# Patient Record
Sex: Male | Born: 1935 | Race: White | Hispanic: No | Smoking: Former smoker
Health system: Southern US, Community
[De-identification: ages and names within clinical notes are randomized; demographics above are authoritative.]

## PROBLEM LIST (undated history)

## (undated) DIAGNOSIS — K259 Gastric ulcer, unspecified as acute or chronic, without hemorrhage or perforation: Secondary | ICD-10-CM

## (undated) DIAGNOSIS — Z8639 Personal history of other endocrine, nutritional and metabolic disease: Secondary | ICD-10-CM

## (undated) DIAGNOSIS — A498 Other bacterial infections of unspecified site: Secondary | ICD-10-CM

## (undated) DIAGNOSIS — N400 Enlarged prostate without lower urinary tract symptoms: Secondary | ICD-10-CM

## (undated) DIAGNOSIS — D126 Benign neoplasm of colon, unspecified: Secondary | ICD-10-CM

## (undated) DIAGNOSIS — M1990 Unspecified osteoarthritis, unspecified site: Secondary | ICD-10-CM

## (undated) DIAGNOSIS — R4182 Altered mental status, unspecified: Secondary | ICD-10-CM

## (undated) DIAGNOSIS — I10 Essential (primary) hypertension: Secondary | ICD-10-CM

## (undated) DIAGNOSIS — E43 Unspecified severe protein-calorie malnutrition: Secondary | ICD-10-CM

## (undated) DIAGNOSIS — K635 Polyp of colon: Secondary | ICD-10-CM

## (undated) DIAGNOSIS — F411 Generalized anxiety disorder: Secondary | ICD-10-CM

## (undated) DIAGNOSIS — R911 Solitary pulmonary nodule: Secondary | ICD-10-CM

## (undated) DIAGNOSIS — M6281 Muscle weakness (generalized): Secondary | ICD-10-CM

## (undated) DIAGNOSIS — K1370 Unspecified lesions of oral mucosa: Secondary | ICD-10-CM

## (undated) DIAGNOSIS — F29 Unspecified psychosis not due to a substance or known physiological condition: Secondary | ICD-10-CM

## (undated) DIAGNOSIS — F329 Major depressive disorder, single episode, unspecified: Secondary | ICD-10-CM

## (undated) DIAGNOSIS — G259 Extrapyramidal and movement disorder, unspecified: Secondary | ICD-10-CM

## (undated) DIAGNOSIS — F1021 Alcohol dependence, in remission: Secondary | ICD-10-CM

## (undated) DIAGNOSIS — H269 Unspecified cataract: Secondary | ICD-10-CM

## (undated) DIAGNOSIS — N179 Acute kidney failure, unspecified: Secondary | ICD-10-CM

## (undated) DIAGNOSIS — F0390 Unspecified dementia without behavioral disturbance: Secondary | ICD-10-CM

## (undated) DIAGNOSIS — G459 Transient cerebral ischemic attack, unspecified: Secondary | ICD-10-CM

## (undated) DIAGNOSIS — R279 Unspecified lack of coordination: Secondary | ICD-10-CM

## (undated) DIAGNOSIS — F32A Depression, unspecified: Secondary | ICD-10-CM

## (undated) DIAGNOSIS — Z8619 Personal history of other infectious and parasitic diseases: Secondary | ICD-10-CM

## (undated) DIAGNOSIS — Z87440 Personal history of urinary (tract) infections: Secondary | ICD-10-CM

## (undated) DIAGNOSIS — Z8719 Personal history of other diseases of the digestive system: Secondary | ICD-10-CM

## (undated) DIAGNOSIS — E785 Hyperlipidemia, unspecified: Secondary | ICD-10-CM

## (undated) DIAGNOSIS — G4700 Insomnia, unspecified: Secondary | ICD-10-CM

## (undated) DIAGNOSIS — E119 Type 2 diabetes mellitus without complications: Secondary | ICD-10-CM

## (undated) DIAGNOSIS — F1011 Alcohol abuse, in remission: Secondary | ICD-10-CM

## (undated) DIAGNOSIS — S065X9A Traumatic subdural hemorrhage with loss of consciousness of unspecified duration, initial encounter: Secondary | ICD-10-CM

## (undated) DIAGNOSIS — I2510 Atherosclerotic heart disease of native coronary artery without angina pectoris: Secondary | ICD-10-CM

## (undated) DIAGNOSIS — I480 Paroxysmal atrial fibrillation: Secondary | ICD-10-CM

## (undated) DIAGNOSIS — F39 Unspecified mood [affective] disorder: Secondary | ICD-10-CM

## (undated) HISTORY — DX: Benign neoplasm of colon, unspecified: D12.6

## (undated) HISTORY — DX: Unspecified cataract: H26.9

## (undated) HISTORY — DX: Traumatic subdural hemorrhage with loss of consciousness of unspecified duration, initial encounter: S06.5X9A

## (undated) HISTORY — DX: Essential (primary) hypertension: I10

## (undated) HISTORY — DX: Personal history of other endocrine, nutritional and metabolic disease: Z86.39

## (undated) HISTORY — DX: Polyp of colon: K63.5

## (undated) HISTORY — DX: Other bacterial infections of unspecified site: A49.8

## (undated) HISTORY — DX: Gastric ulcer, unspecified as acute or chronic, without hemorrhage or perforation: K25.9

## (undated) HISTORY — DX: Benign prostatic hyperplasia without lower urinary tract symptoms: N40.0

## (undated) HISTORY — DX: Atherosclerotic heart disease of native coronary artery without angina pectoris: I25.10

## (undated) HISTORY — DX: Unspecified severe protein-calorie malnutrition: E43

## (undated) HISTORY — DX: Personal history of other infectious and parasitic diseases: Z86.19

## (undated) HISTORY — DX: Unspecified osteoarthritis, unspecified site: M19.90

## (undated) HISTORY — DX: Personal history of urinary (tract) infections: Z87.440

## (undated) HISTORY — DX: Personal history of other diseases of the digestive system: Z87.19

## (undated) HISTORY — DX: Transient cerebral ischemic attack, unspecified: G45.9

## (undated) HISTORY — DX: Alcohol dependence, in remission: F10.21

## (undated) HISTORY — DX: Depression, unspecified: F32.A

## (undated) HISTORY — DX: Hyperlipidemia, unspecified: E78.5

## (undated) HISTORY — DX: Solitary pulmonary nodule: R91.1

## (undated) HISTORY — DX: Paroxysmal atrial fibrillation: I48.0

## (undated) HISTORY — DX: Acute kidney failure, unspecified: N17.9

## (undated) HISTORY — DX: Type 2 diabetes mellitus without complications: E11.9

## (undated) HISTORY — DX: Insomnia, unspecified: G47.00

## (undated) HISTORY — DX: Generalized anxiety disorder: F41.1

## (undated) HISTORY — DX: Major depressive disorder, single episode, unspecified: F32.9

## (undated) HISTORY — DX: Unspecified dementia, unspecified severity, without behavioral disturbance, psychotic disturbance, mood disturbance, and anxiety: F03.90

## (undated) HISTORY — DX: Unspecified lesions of oral mucosa: K13.70

## (undated) HISTORY — DX: Alcohol abuse, in remission: F10.11

---

## 2000-08-27 ENCOUNTER — Emergency Department (HOSPITAL_COMMUNITY): Admission: EM | Admit: 2000-08-27 | Discharge: 2000-08-27 | Payer: Self-pay | Admitting: Emergency Medicine

## 2000-08-27 ENCOUNTER — Encounter: Payer: Self-pay | Admitting: Emergency Medicine

## 2000-08-29 ENCOUNTER — Emergency Department (HOSPITAL_COMMUNITY): Admission: EM | Admit: 2000-08-29 | Discharge: 2000-08-30 | Payer: Self-pay | Admitting: Emergency Medicine

## 2000-09-12 ENCOUNTER — Emergency Department (HOSPITAL_COMMUNITY): Admission: EM | Admit: 2000-09-12 | Discharge: 2000-09-12 | Payer: Self-pay | Admitting: Emergency Medicine

## 2001-03-17 ENCOUNTER — Encounter: Admission: RE | Admit: 2001-03-17 | Discharge: 2001-03-17 | Payer: Self-pay | Admitting: Family Medicine

## 2001-03-31 ENCOUNTER — Encounter: Admission: RE | Admit: 2001-03-31 | Discharge: 2001-03-31 | Payer: Self-pay | Admitting: Family Medicine

## 2001-05-04 ENCOUNTER — Encounter: Admission: RE | Admit: 2001-05-04 | Discharge: 2001-05-04 | Payer: Self-pay | Admitting: Sports Medicine

## 2001-05-20 ENCOUNTER — Encounter: Admission: RE | Admit: 2001-05-20 | Discharge: 2001-05-20 | Payer: Self-pay | Admitting: *Deleted

## 2001-05-20 ENCOUNTER — Encounter: Payer: Self-pay | Admitting: *Deleted

## 2001-06-17 ENCOUNTER — Encounter: Admission: RE | Admit: 2001-06-17 | Discharge: 2001-06-17 | Payer: Self-pay | Admitting: Family Medicine

## 2001-07-29 ENCOUNTER — Encounter: Admission: RE | Admit: 2001-07-29 | Discharge: 2001-07-29 | Payer: Self-pay | Admitting: Family Medicine

## 2001-08-02 ENCOUNTER — Encounter: Payer: Self-pay | Admitting: *Deleted

## 2001-08-02 ENCOUNTER — Encounter: Admission: RE | Admit: 2001-08-02 | Discharge: 2001-08-02 | Payer: Self-pay | Admitting: *Deleted

## 2001-09-06 ENCOUNTER — Encounter: Admission: RE | Admit: 2001-09-06 | Discharge: 2001-09-06 | Payer: Self-pay | Admitting: Family Medicine

## 2001-09-06 HISTORY — PX: INJECTION KNEE: SHX2446

## 2001-10-04 ENCOUNTER — Encounter: Admission: RE | Admit: 2001-10-04 | Discharge: 2001-10-04 | Payer: Self-pay | Admitting: Family Medicine

## 2001-10-14 ENCOUNTER — Encounter: Admission: RE | Admit: 2001-10-14 | Discharge: 2001-10-14 | Payer: Self-pay | Admitting: Family Medicine

## 2002-01-13 ENCOUNTER — Encounter: Admission: RE | Admit: 2002-01-13 | Discharge: 2002-01-13 | Payer: Self-pay | Admitting: Family Medicine

## 2002-02-03 ENCOUNTER — Encounter: Admission: RE | Admit: 2002-02-03 | Discharge: 2002-02-03 | Payer: Self-pay | Admitting: Family Medicine

## 2002-02-28 ENCOUNTER — Encounter: Payer: Self-pay | Admitting: *Deleted

## 2002-02-28 ENCOUNTER — Encounter: Admission: RE | Admit: 2002-02-28 | Discharge: 2002-02-28 | Payer: Self-pay | Admitting: Family Medicine

## 2002-02-28 ENCOUNTER — Encounter: Admission: RE | Admit: 2002-02-28 | Discharge: 2002-02-28 | Payer: Self-pay | Admitting: *Deleted

## 2003-12-11 ENCOUNTER — Other Ambulatory Visit: Payer: Self-pay

## 2003-12-14 ENCOUNTER — Other Ambulatory Visit: Payer: Self-pay

## 2004-02-05 ENCOUNTER — Encounter: Admission: RE | Admit: 2004-02-05 | Discharge: 2004-02-05 | Payer: Self-pay | Admitting: Family Medicine

## 2004-10-27 DIAGNOSIS — I2510 Atherosclerotic heart disease of native coronary artery without angina pectoris: Secondary | ICD-10-CM

## 2004-10-27 HISTORY — PX: CORONARY ARTERY BYPASS GRAFT: SHX141

## 2004-10-27 HISTORY — DX: Atherosclerotic heart disease of native coronary artery without angina pectoris: I25.10

## 2005-04-18 ENCOUNTER — Inpatient Hospital Stay (HOSPITAL_COMMUNITY): Admission: EM | Admit: 2005-04-18 | Discharge: 2005-04-29 | Payer: Self-pay | Admitting: *Deleted

## 2005-04-19 ENCOUNTER — Ambulatory Visit: Payer: Self-pay | Admitting: Internal Medicine

## 2005-05-03 ENCOUNTER — Emergency Department (HOSPITAL_COMMUNITY): Admission: EM | Admit: 2005-05-03 | Discharge: 2005-05-03 | Payer: Self-pay | Admitting: Emergency Medicine

## 2005-06-01 ENCOUNTER — Inpatient Hospital Stay (HOSPITAL_COMMUNITY): Admission: EM | Admit: 2005-06-01 | Discharge: 2005-06-02 | Payer: Self-pay | Admitting: Emergency Medicine

## 2005-06-02 ENCOUNTER — Encounter: Payer: Self-pay | Admitting: Cardiology

## 2006-05-15 ENCOUNTER — Inpatient Hospital Stay (HOSPITAL_COMMUNITY): Admission: EM | Admit: 2006-05-15 | Discharge: 2006-05-15 | Payer: Self-pay | Admitting: Emergency Medicine

## 2006-05-15 ENCOUNTER — Ambulatory Visit: Payer: Self-pay | Admitting: *Deleted

## 2006-06-20 ENCOUNTER — Emergency Department (HOSPITAL_COMMUNITY): Admission: EM | Admit: 2006-06-20 | Discharge: 2006-06-20 | Payer: Self-pay | Admitting: Emergency Medicine

## 2006-07-28 ENCOUNTER — Ambulatory Visit: Payer: Self-pay | Admitting: Gastroenterology

## 2006-12-24 DIAGNOSIS — M545 Low back pain, unspecified: Secondary | ICD-10-CM | POA: Insufficient documentation

## 2006-12-24 DIAGNOSIS — I2510 Atherosclerotic heart disease of native coronary artery without angina pectoris: Secondary | ICD-10-CM | POA: Insufficient documentation

## 2006-12-24 DIAGNOSIS — D126 Benign neoplasm of colon, unspecified: Secondary | ICD-10-CM

## 2006-12-24 DIAGNOSIS — F411 Generalized anxiety disorder: Secondary | ICD-10-CM | POA: Insufficient documentation

## 2006-12-24 DIAGNOSIS — K259 Gastric ulcer, unspecified as acute or chronic, without hemorrhage or perforation: Secondary | ICD-10-CM | POA: Insufficient documentation

## 2006-12-24 DIAGNOSIS — M129 Arthropathy, unspecified: Secondary | ICD-10-CM | POA: Insufficient documentation

## 2006-12-24 DIAGNOSIS — G479 Sleep disorder, unspecified: Secondary | ICD-10-CM | POA: Insufficient documentation

## 2006-12-24 DIAGNOSIS — F1021 Alcohol dependence, in remission: Secondary | ICD-10-CM

## 2006-12-24 DIAGNOSIS — G459 Transient cerebral ischemic attack, unspecified: Secondary | ICD-10-CM | POA: Insufficient documentation

## 2006-12-24 HISTORY — DX: Benign neoplasm of colon, unspecified: D12.6

## 2006-12-24 HISTORY — DX: Alcohol dependence, in remission: F10.21

## 2007-08-13 ENCOUNTER — Emergency Department: Payer: Medicare Other | Admitting: Emergency Medicine

## 2007-09-29 ENCOUNTER — Emergency Department: Payer: Medicare Other | Admitting: Emergency Medicine

## 2007-09-29 ENCOUNTER — Other Ambulatory Visit: Payer: Self-pay

## 2007-10-21 ENCOUNTER — Ambulatory Visit: Payer: Self-pay | Admitting: Internal Medicine

## 2007-10-21 ENCOUNTER — Inpatient Hospital Stay (HOSPITAL_COMMUNITY): Admission: EM | Admit: 2007-10-21 | Discharge: 2007-10-23 | Payer: Self-pay | Admitting: Emergency Medicine

## 2007-10-24 ENCOUNTER — Emergency Department: Payer: Medicare Other | Admitting: Emergency Medicine

## 2008-01-10 ENCOUNTER — Ambulatory Visit: Payer: Medicare Other

## 2008-05-27 HISTORY — PX: CARDIOVASCULAR STRESS TEST: SHX262

## 2008-07-14 ENCOUNTER — Emergency Department: Payer: Medicare Other | Admitting: Emergency Medicine

## 2008-09-09 ENCOUNTER — Emergency Department: Payer: Medicare Other | Admitting: Emergency Medicine

## 2009-01-07 ENCOUNTER — Emergency Department: Payer: Medicare Other

## 2009-06-08 ENCOUNTER — Emergency Department (HOSPITAL_COMMUNITY): Admission: EM | Admit: 2009-06-08 | Discharge: 2009-06-08 | Payer: Self-pay | Admitting: Emergency Medicine

## 2009-08-22 ENCOUNTER — Emergency Department (HOSPITAL_COMMUNITY): Admission: EM | Admit: 2009-08-22 | Discharge: 2009-08-22 | Payer: Self-pay | Admitting: Emergency Medicine

## 2009-09-02 ENCOUNTER — Emergency Department: Payer: Medicare Other | Admitting: Emergency Medicine

## 2009-10-10 ENCOUNTER — Emergency Department: Payer: Medicare Other | Admitting: Unknown Physician Specialty

## 2009-10-19 ENCOUNTER — Emergency Department (HOSPITAL_COMMUNITY): Admission: EM | Admit: 2009-10-19 | Discharge: 2009-10-19 | Payer: Self-pay | Admitting: Emergency Medicine

## 2009-10-29 LAB — TSH: TSH: 5.51

## 2009-11-12 ENCOUNTER — Inpatient Hospital Stay (HOSPITAL_COMMUNITY): Admission: EM | Admit: 2009-11-12 | Discharge: 2009-11-15 | Payer: Self-pay | Admitting: Emergency Medicine

## 2009-12-08 ENCOUNTER — Ambulatory Visit: Payer: Medicare Other | Admitting: Family Medicine

## 2010-05-27 DIAGNOSIS — R911 Solitary pulmonary nodule: Secondary | ICD-10-CM

## 2010-05-27 HISTORY — DX: Solitary pulmonary nodule: R91.1

## 2010-05-30 ENCOUNTER — Emergency Department (HOSPITAL_COMMUNITY): Admission: EM | Admit: 2010-05-30 | Discharge: 2010-05-30 | Payer: Self-pay | Admitting: Emergency Medicine

## 2010-05-30 LAB — COMPREHENSIVE METABOLIC PANEL
ALT: 85 U/L — AB (ref 10–40)
AST: 135 U/L
Alkaline Phosphatase: 113 U/L
Creat: 1.1
Sodium: 141 mmol/L (ref 137–147)
Total Bilirubin: 1.9 mg/dL

## 2010-05-30 LAB — CBC AND DIFFERENTIAL
HCT: 46 %
Hemoglobin: 16.1 g/dL (ref 13.5–17.5)
WBC: 10.1

## 2010-08-05 ENCOUNTER — Emergency Department: Payer: Medicare Other | Admitting: Emergency Medicine

## 2010-09-25 ENCOUNTER — Inpatient Hospital Stay: Payer: Medicare Other | Admitting: *Deleted

## 2010-10-14 ENCOUNTER — Emergency Department: Payer: Medicare Other | Admitting: Emergency Medicine

## 2010-10-15 ENCOUNTER — Inpatient Hospital Stay (HOSPITAL_COMMUNITY)
Admission: EM | Admit: 2010-10-15 | Discharge: 2010-10-19 | Payer: Self-pay | Source: Home / Self Care | Attending: Internal Medicine | Admitting: Internal Medicine

## 2010-10-27 DIAGNOSIS — A498 Other bacterial infections of unspecified site: Secondary | ICD-10-CM

## 2010-10-27 HISTORY — DX: Other bacterial infections of unspecified site: A49.8

## 2010-11-03 ENCOUNTER — Inpatient Hospital Stay (HOSPITAL_COMMUNITY)
Admission: EM | Admit: 2010-11-03 | Discharge: 2010-11-08 | Payer: Self-pay | Source: Home / Self Care | Attending: Internal Medicine | Admitting: Internal Medicine

## 2010-11-11 LAB — URINALYSIS, ROUTINE W REFLEX MICROSCOPIC
Bilirubin Urine: NEGATIVE
Hgb urine dipstick: NEGATIVE
Ketones, ur: NEGATIVE mg/dL
Nitrite: NEGATIVE
Protein, ur: NEGATIVE mg/dL
Specific Gravity, Urine: 1.016 (ref 1.005–1.030)
Urine Glucose, Fasting: NEGATIVE mg/dL
Urobilinogen, UA: 0.2 mg/dL (ref 0.0–1.0)
pH: 5.5 (ref 5.0–8.0)

## 2010-11-11 LAB — DIFFERENTIAL
Basophils Absolute: 0 10*3/uL (ref 0.0–0.1)
Basophils Absolute: 0 10*3/uL (ref 0.0–0.1)
Basophils Relative: 0 % (ref 0–1)
Basophils Relative: 0 % (ref 0–1)
Eosinophils Absolute: 0 10*3/uL (ref 0.0–0.7)
Eosinophils Absolute: 0 10*3/uL (ref 0.0–0.7)
Eosinophils Relative: 0 % (ref 0–5)
Eosinophils Relative: 0 % (ref 0–5)
Lymphocytes Relative: 8 % — ABNORMAL LOW (ref 12–46)
Lymphocytes Relative: 14 % (ref 12–46)
Lymphs Abs: 1.5 10*3/uL (ref 0.7–4.0)
Lymphs Abs: 2.2 10*3/uL (ref 0.7–4.0)
Monocytes Absolute: 3.3 10*3/uL — ABNORMAL HIGH (ref 0.1–1.0)
Monocytes Absolute: 2.6 10*3/uL — ABNORMAL HIGH (ref 0.1–1.0)
Monocytes Relative: 17 % — ABNORMAL HIGH (ref 3–12)
Monocytes Relative: 17 % — ABNORMAL HIGH (ref 3–12)
Neutro Abs: 14.7 10*3/uL — ABNORMAL HIGH (ref 1.7–7.7)
Neutro Abs: 10.9 10*3/uL — ABNORMAL HIGH (ref 1.7–7.7)
Neutrophils Relative %: 75 % (ref 43–77)
Neutrophils Relative %: 69 % (ref 43–77)

## 2010-11-11 LAB — CBC
HCT: 42.7 % (ref 39.0–52.0)
HCT: 38.5 % — ABNORMAL LOW (ref 39.0–52.0)
HCT: 37.2 % — ABNORMAL LOW (ref 39.0–52.0)
Hemoglobin: 14.7 g/dL (ref 13.0–17.0)
Hemoglobin: 12.7 g/dL — ABNORMAL LOW (ref 13.0–17.0)
Hemoglobin: 12.6 g/dL — ABNORMAL LOW (ref 13.0–17.0)
MCH: 30.5 pg (ref 26.0–34.0)
MCH: 29.4 pg (ref 26.0–34.0)
MCH: 29.9 pg (ref 26.0–34.0)
MCHC: 34.4 g/dL (ref 30.0–36.0)
MCHC: 33 g/dL (ref 30.0–36.0)
MCHC: 33.9 g/dL (ref 30.0–36.0)
MCV: 88.6 fL (ref 78.0–100.0)
MCV: 89.1 fL (ref 78.0–100.0)
MCV: 88.2 fL (ref 78.0–100.0)
Platelets: 197 10*3/uL (ref 150–400)
Platelets: 177 10*3/uL (ref 150–400)
Platelets: 167 10*3/uL (ref 150–400)
RBC: 4.82 MIL/uL (ref 4.22–5.81)
RBC: 4.32 MIL/uL (ref 4.22–5.81)
RBC: 4.22 MIL/uL (ref 4.22–5.81)
RDW: 13.3 % (ref 11.5–15.5)
RDW: 13.4 % (ref 11.5–15.5)
RDW: 13.3 % (ref 11.5–15.5)
WBC: 19.6 10*3/uL — ABNORMAL HIGH (ref 4.0–10.5)
WBC: 15.7 10*3/uL — ABNORMAL HIGH (ref 4.0–10.5)
WBC: 6.7 10*3/uL (ref 4.0–10.5)

## 2010-11-11 LAB — BASIC METABOLIC PANEL
BUN: 21 mg/dL (ref 6–23)
BUN: 21 mg/dL (ref 6–23)
BUN: 14 mg/dL (ref 6–23)
BUN: 9 mg/dL (ref 6–23)
CO2: 24 mEq/L (ref 19–32)
CO2: 25 mEq/L (ref 19–32)
CO2: 24 mEq/L (ref 19–32)
CO2: 23 mEq/L (ref 19–32)
Calcium: 9 mg/dL (ref 8.4–10.5)
Calcium: 8.6 mg/dL (ref 8.4–10.5)
Calcium: 8.3 mg/dL — ABNORMAL LOW (ref 8.4–10.5)
Calcium: 8.3 mg/dL — ABNORMAL LOW (ref 8.4–10.5)
Chloride: 105 mEq/L (ref 96–112)
Chloride: 107 mEq/L (ref 96–112)
Chloride: 112 mEq/L (ref 96–112)
Chloride: 114 mEq/L — ABNORMAL HIGH (ref 96–112)
Creatinine, Ser: 2.05 mg/dL — ABNORMAL HIGH (ref 0.4–1.5)
Creatinine, Ser: 2.07 mg/dL — ABNORMAL HIGH (ref 0.4–1.5)
Creatinine, Ser: 1.07 mg/dL (ref 0.4–1.5)
Creatinine, Ser: 0.97 mg/dL (ref 0.4–1.5)
GFR calc Af Amer: 38 mL/min — ABNORMAL LOW (ref >60)
GFR calc Af Amer: 38 mL/min — ABNORMAL LOW (ref >60)
GFR calc Af Amer: >60 mL/min (ref >60)
GFR calc Af Amer: >60 mL/min (ref >60)
GFR calc non Af Amer: 32 mL/min — ABNORMAL LOW (ref >60)
GFR calc non Af Amer: 31 mL/min — ABNORMAL LOW (ref >60)
GFR calc non Af Amer: >60 mL/min (ref >60)
GFR calc non Af Amer: >60 mL/min (ref >60)
Glucose, Bld: 177 mg/dL — ABNORMAL HIGH (ref 70–99)
Glucose, Bld: 116 mg/dL — ABNORMAL HIGH (ref 70–99)
Glucose, Bld: 86 mg/dL (ref 70–99)
Glucose, Bld: 94 mg/dL (ref 70–99)
Potassium: 4.1 mEq/L (ref 3.5–5.1)
Potassium: 3.4 mEq/L — ABNORMAL LOW (ref 3.5–5.1)
Potassium: 3.7 mEq/L (ref 3.5–5.1)
Potassium: 3.6 mEq/L (ref 3.5–5.1)
Sodium: 138 mEq/L (ref 135–145)
Sodium: 140 mEq/L (ref 135–145)
Sodium: 142 mEq/L (ref 135–145)
Sodium: 143 mEq/L (ref 135–145)

## 2010-11-11 LAB — STOOL CULTURE

## 2010-11-11 LAB — MAGNESIUM: Magnesium: 2.2 mg/dL (ref 1.5–2.5)

## 2010-11-11 LAB — CLOSTRIDIUM DIFFICILE BY PCR

## 2010-12-26 ENCOUNTER — Encounter: Payer: Self-pay | Admitting: Family Medicine

## 2010-12-26 ENCOUNTER — Encounter (INDEPENDENT_AMBULATORY_CARE_PROVIDER_SITE_OTHER): Payer: Self-pay | Admitting: *Deleted

## 2010-12-26 ENCOUNTER — Encounter (INDEPENDENT_AMBULATORY_CARE_PROVIDER_SITE_OTHER): Payer: Medicare Other | Admitting: Family Medicine

## 2010-12-26 DIAGNOSIS — E785 Hyperlipidemia, unspecified: Secondary | ICD-10-CM | POA: Insufficient documentation

## 2010-12-26 DIAGNOSIS — I10 Essential (primary) hypertension: Secondary | ICD-10-CM | POA: Insufficient documentation

## 2010-12-26 DIAGNOSIS — F0390 Unspecified dementia without behavioral disturbance: Secondary | ICD-10-CM

## 2010-12-26 DIAGNOSIS — F329 Major depressive disorder, single episode, unspecified: Secondary | ICD-10-CM

## 2010-12-26 DIAGNOSIS — F1021 Alcohol dependence, in remission: Secondary | ICD-10-CM

## 2010-12-26 DIAGNOSIS — F32A Depression, unspecified: Secondary | ICD-10-CM | POA: Insufficient documentation

## 2011-01-02 NOTE — Letter (Signed)
Summary: Generic Letter  Accident at [facility]  [address] [place], [place] [address]   Phone: [phone]  Fax: [phone]    12/26/2010    To Whom it May Concern:  Bartlomiej Jenkinson was assisting her father Arlington Sigmund) at an appointment in our office today at 2:00 PM. Please excuse her for time missed from work.    Please call with any questions or concerns.          Sincerely,       Selena Batten Dance CMA (AAMA)

## 2011-01-02 NOTE — Assessment & Plan Note (Signed)
Summary: NEW MEDICARE PT TO EST/CLE  AARP MEDICARE COMPLETE,MAILED NPP   Vital Signs:  Patient profile:   77 year old male Height:      73.75 inches Weight:      173.75 pounds BMI:     22.54 Temp:     98.9 degrees F oral Pulse rate:   88 / minute Pulse rhythm:   regular BP sitting:   124 / 80  (left arm) Cuff size:   regular  Vitals Entered By: Selena Batten Dance CMA Duncan Dull) (December 26, 2010 2:11 PM) CC: New patient to establish care   History of Present Illness: CC: new patient  presents with daughter and caregiver, [place].  Has been with daughter for last year, prior at rehab after hospitalization for ?CVA (will review records) .  Previously saw Dr. Loma Sender in [place].  1. Dementia? - some days good, some days bad.  Noticed some dementia for the last year.  worsening over last 2 months.  Started on Aricept as well as Namenda.  (dx at psychiatric hosptial per daughter)  daughter wants more education about dementia.  ? depression.  easily gets down, doesn't like to go outside.  2. h/o Cdiff 1 month ago, hospitalized at [facility], has completed 6 wk vanc course.  since that infection, gets worried about having rpt bowel accidents (none in weeks).  Current Medications (verified): 1)  Multivitamins  Tabs (Multiple Vitamin) .Marland Kitchen.. 1 By Mouth Once Daily 2)  Aspirin Ec Low Strength 81 Mg Tbec (Aspirin) .Marland Kitchen.. 1 By Mouth Once Daily 3)  Namenda 10 Mg Tabs (Memantine Hcl) .Marland Kitchen.. 1 By Mouth Two Times A Day 4)  Donepezil Hcl 5 Mg Tabs (Donepezil Hcl) .Marland Kitchen.. 1 By Mouth Once Daily 5)  Tamsulosin Hcl 0.4 Mg Caps (Tamsulosin Hcl) .Marland Kitchen.. 1 By Mouth Once Daily 6)  Potassium Chloride 20 Meq/2ml (10%) Liqd (Potassium Chloride) .... One Teaspoon Daily  Allergies (verified): 1)  ! Codeine  Past History:  Past Medical History: h/o recurrent Cdiff last treatment 10/2010 depression? CAD BPH Arthritis (fingers and shoulders) colon polyps, last colonsocopy 2010 h/o hypothyroid h/o chronic pancreatitis h/o  EtOHism, sober x years h/o Afib h/o UTIs h/o HTN h/o HLD h/o T2DM h/o ARF h/o chicken pox  Cards - Dr. 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  The patient denies anorexia, fever, weight loss, weight gain, vision loss, decreased hearing, hoarseness, chest pain, syncope, dyspnea on exertion, peripheral edema, prolonged cough, headaches, hemoptysis, abdominal pain, melena, hematochezia, severe indigestion/heartburn, and hematuria.         stool incontinence  Physical Exam  General:  Well-developed,well-nourished,in no acute distress Head:  Normocephalic and atraumatic without obvious abnormalities. No apparent alopecia or balding.   Eyes:  PERRLA, EOMI, no injection Ears:  TMs clear bilaterally, cerumen bilaterally Nose:  nares clear bilaterally Mouth:  mmm, no pharyngeal erythema/exduates Neck:  No deformities, masses, or tenderness noted.  no TM, no LAD, no carotid bruits Lungs:  Normal  respiratory effort, chest expands symmetrically. Lungs are clear to auscultation, no crackles or wheezes. Heart:  Normal rate and regular rhythm. S1 and S2 normal without gallop, murmur, click, rub or other extra sounds. Abdomen:  Bowel sounds positive,abdomen soft and non-tender without masses, organomegaly or hernias noted. Msk:  No deformity or scoliosis noted of thoracic or lumbar spine.   Pulses:  2+ rad pulses, brisk cap refill Extremities:  no pedal edema Neurologic:  CN grossly  intact, station and gait intact.  + fidgeting and repetitive mouth chewing when not engaged. Skin:  Intact without suspicious lesions or rashes Psych:  full affect, ease with simple commands, easily confuses mother/wife, bright affect today.   Impression & Recommendations:  Problem # 1:  DEMENTIA, SENILE (ICD-290.0) will have him return for geriatric assessment, hopeful to have records from cards and pcp by then.  ? some depression given daughter's description of decreased desire to engate.  Start low dose sertraline, monitor response at f/u visit.  consider increase in donepezil at next visit as seems to be tolerating very well.  handout on basics of dementia from [facility] provided to daughter.  Problem # 2:  ALCOHOL ABUSE, HX OF (ICD-V11.3) abstinent for years.  encouraged to continue.  Problem # 3:  COLON POLYP (ICD-211.3) await records.  Problem # 4:  Hx of HYPERTENSION, ESSENTIAL (ICD-401.9) stable currently.  await records.  Problem # 5:  DEPRESSION (ICD-311) await records, review.  discsused starting med, will monitor response. His updated medication list for this problem includes:    Sertraline Hcl 25 Mg Tabs (Sertraline hcl) ..... One daily for depression  Complete Medication List: 1)  Multivitamins Tabs (Multiple vitamin) .Marland Kitchen.. 1 by mouth once daily 2)  Aspirin Ec Low Strength 81 Mg Tbec (Aspirin) .Marland Kitchen.. 1 by mouth once daily 3)  Namenda 10 Mg Tabs (Memantine hcl) .Marland Kitchen.. 1 by mouth two times a day 4)  Donepezil Hcl 5 Mg Tabs (Donepezil hcl) .Marland Kitchen.. 1 by mouth once daily 5)  Tamsulosin Hcl 0.4 Mg Caps (Tamsulosin hcl) .Marland Kitchen.. 1 by mouth once daily 6)  Potassium Chloride 20 Meq/53ml (10%) Liqd (Potassium chloride) .... One teaspoon daily 7)  Sertraline Hcl 25 Mg Tabs (Sertraline hcl) .... One daily for depression  Patient Instructions: 1)  Please return in 1 month for follow up and geriatric assessment. 2)  Start sertraline 25mg  daily for depression. 3)  release of information from  Dr. Vear Clock and Dr. Rennis Golden 4)  I will review records. 5)  Good to meet you today, call clinic with quesitons. Prescriptions: SERTRALINE HCL 25 MG TABS (SERTRALINE HCL) one daily for depression  #30 x 3   Entered and Authorized by:   [name]  MD   Signed by:   [name]  MD on 12/26/2010   Method used:   [facility]        [facility]  [address] [phone]* (retail)       [address]       [place], [place]  [address]       Ph: [phone]       Fax: [phone]   RxID:   [id number]    Orders Added: 1)  New Patient Level IV [35573]    Current Allergies (reviewed today): ! CODEINE

## 2011-01-06 LAB — BASIC METABOLIC PANEL
BUN: 11 mg/dL (ref 6–23)
CO2: 21 mEq/L (ref 19–32)
Chloride: 116 mEq/L — ABNORMAL HIGH (ref 96–112)
Glucose, Bld: 89 mg/dL (ref 70–99)
Potassium: 3.2 mEq/L — ABNORMAL LOW (ref 3.5–5.1)
Potassium: 3.8 mEq/L (ref 3.5–5.1)
Sodium: 141 mEq/L (ref 135–145)
Sodium: 142 mEq/L (ref 135–145)

## 2011-01-06 LAB — DIFFERENTIAL
Basophils Relative: 0 % (ref 0–1)
Monocytes Absolute: 1.2 10*3/uL — ABNORMAL HIGH (ref 0.1–1.0)
Monocytes Relative: 11 % (ref 3–12)
Neutro Abs: 7.4 10*3/uL (ref 1.7–7.7)

## 2011-01-06 LAB — GLUCOSE, CAPILLARY
Glucose-Capillary: 112 mg/dL — ABNORMAL HIGH (ref 70–99)
Glucose-Capillary: 84 mg/dL (ref 70–99)

## 2011-01-06 LAB — HEMOCCULT GUIAC POC 1CARD (OFFICE): Fecal Occult Bld: NEGATIVE

## 2011-01-06 LAB — CBC
HCT: 37.9 % — ABNORMAL LOW (ref 39.0–52.0)
HCT: 48.1 % (ref 39.0–52.0)
Hemoglobin: 12.8 g/dL — ABNORMAL LOW (ref 13.0–17.0)
Hemoglobin: 16.5 g/dL (ref 13.0–17.0)
MCH: 31 pg (ref 26.0–34.0)
MCHC: 34.3 g/dL (ref 30.0–36.0)
MCV: 89.4 fL (ref 78.0–100.0)
RBC: 4.24 MIL/uL (ref 4.22–5.81)
RDW: 14 % (ref 11.5–15.5)
WBC: 8 10*3/uL (ref 4.0–10.5)

## 2011-01-06 LAB — STOOL CULTURE

## 2011-01-10 LAB — COMPREHENSIVE METABOLIC PANEL
ALT: 85 U/L — ABNORMAL HIGH (ref 0–53)
Alkaline Phosphatase: 113 U/L (ref 39–117)
BUN: 13 mg/dL (ref 6–23)
CO2: 29 mEq/L (ref 19–32)
Calcium: 9.3 mg/dL (ref 8.4–10.5)
GFR calc non Af Amer: >60 mL/min (ref >60)
Glucose, Bld: 159 mg/dL — ABNORMAL HIGH (ref 70–99)
Potassium: 3.7 mEq/L (ref 3.5–5.1)
Sodium: 141 mEq/L (ref 135–145)
Total Protein: 7.6 g/dL (ref 6.0–8.3)

## 2011-01-10 LAB — CBC
HCT: 45.9 % (ref 39.0–52.0)
Hemoglobin: 16.1 g/dL (ref 13.0–17.0)
MCHC: 35.1 g/dL (ref 30.0–36.0)
MCV: 86.1 fL (ref 78.0–100.0)
RDW: 13.9 % (ref 11.5–15.5)

## 2011-01-10 LAB — LIPASE, BLOOD: Lipase: 55 U/L (ref 11–59)

## 2011-01-10 LAB — URINE CULTURE
Colony Count: >=100000
Culture  Setup Time: 201108040038

## 2011-01-10 LAB — DIFFERENTIAL
Basophils Absolute: 0.1 10*3/uL (ref 0.0–0.1)
Basophils Relative: 1 % (ref 0–1)
Eosinophils Absolute: 0.2 10*3/uL (ref 0.0–0.7)
Monocytes Relative: 10 % (ref 3–12)
Neutro Abs: 6.8 10*3/uL (ref 1.7–7.7)
Neutrophils Relative %: 68 % (ref 43–77)

## 2011-01-10 LAB — URINE MICROSCOPIC-ADD ON

## 2011-01-10 LAB — URINALYSIS, ROUTINE W REFLEX MICROSCOPIC
Glucose, UA: NEGATIVE mg/dL
Hgb urine dipstick: NEGATIVE
Ketones, ur: 15 mg/dL — AB
Protein, ur: NEGATIVE mg/dL
pH: 6 (ref 5.0–8.0)

## 2011-01-13 LAB — URINALYSIS, ROUTINE W REFLEX MICROSCOPIC
Glucose, UA: NEGATIVE mg/dL
Hgb urine dipstick: NEGATIVE
Ketones, ur: 15 mg/dL — AB
Nitrite: NEGATIVE
Protein, ur: NEGATIVE mg/dL
Specific Gravity, Urine: 1.031 — ABNORMAL HIGH (ref 1.005–1.030)
Urobilinogen, UA: 1 mg/dL (ref 0.0–1.0)
pH: 5.5 (ref 5.0–8.0)

## 2011-01-13 LAB — COMPREHENSIVE METABOLIC PANEL WITH GFR
ALT: 14 U/L (ref 0–53)
AST: 18 U/L (ref 0–37)
Albumin: 3.8 g/dL (ref 3.5–5.2)
Alkaline Phosphatase: 68 U/L (ref 39–117)
BUN: 15 mg/dL (ref 6–23)
CO2: 31 meq/L (ref 19–32)
Calcium: 8.9 mg/dL (ref 8.4–10.5)
Chloride: 103 meq/L (ref 96–112)
Creatinine, Ser: 1.35 mg/dL (ref 0.4–1.5)
GFR calc non Af Amer: 52 mL/min — ABNORMAL LOW
Glucose, Bld: 126 mg/dL — ABNORMAL HIGH (ref 70–99)
Potassium: 2.8 meq/L — ABNORMAL LOW (ref 3.5–5.1)
Sodium: 143 meq/L (ref 135–145)
Total Bilirubin: 1.7 mg/dL — ABNORMAL HIGH (ref 0.3–1.2)
Total Protein: 6.9 g/dL (ref 6.0–8.3)

## 2011-01-13 LAB — BASIC METABOLIC PANEL
BUN: 11 mg/dL (ref 6–23)
CO2: 29 mEq/L (ref 19–32)
CO2: 29 mEq/L (ref 19–32)
Calcium: 8.1 mg/dL — ABNORMAL LOW (ref 8.4–10.5)
Calcium: 8.3 mg/dL — ABNORMAL LOW (ref 8.4–10.5)
Chloride: 110 mEq/L (ref 96–112)
Chloride: 112 mEq/L (ref 96–112)
Creatinine, Ser: 1.02 mg/dL (ref 0.4–1.5)
Creatinine, Ser: 1.08 mg/dL (ref 0.4–1.5)
Creatinine, Ser: 1.1 mg/dL (ref 0.4–1.5)
GFR calc Af Amer: >60 mL/min (ref >60)
GFR calc Af Amer: >60 mL/min (ref >60)
GFR calc non Af Amer: >60 mL/min (ref >60)
Glucose, Bld: 89 mg/dL (ref 70–99)
Potassium: 3.8 mEq/L (ref 3.5–5.1)
Sodium: 140 mEq/L (ref 135–145)
Sodium: 144 mEq/L (ref 135–145)

## 2011-01-13 LAB — LIPID PANEL
Cholesterol: 103 mg/dL (ref 0–200)
HDL: 40 mg/dL (ref >39)
Total CHOL/HDL Ratio: 2.6 RATIO
Triglycerides: 82 mg/dL (ref <150)

## 2011-01-13 LAB — BRAIN NATRIURETIC PEPTIDE: Pro B Natriuretic peptide (BNP): <30 pg/mL (ref 0.0–100.0)

## 2011-01-13 LAB — DIFFERENTIAL
Basophils Absolute: 0.1 10*3/uL (ref 0.0–0.1)
Basophils Relative: 1 % (ref 0–1)
Eosinophils Absolute: 0.1 10*3/uL (ref 0.0–0.7)
Eosinophils Relative: 1 % (ref 0–5)
Lymphocytes Relative: 22 % (ref 12–46)
Lymphs Abs: 1.7 10*3/uL (ref 0.7–4.0)
Monocytes Absolute: 0.6 10*3/uL (ref 0.1–1.0)
Monocytes Relative: 7 % (ref 3–12)
Neutro Abs: 5.4 10*3/uL (ref 1.7–7.7)
Neutrophils Relative %: 70 % (ref 43–77)

## 2011-01-13 LAB — CBC
HCT: 43.8 % (ref 39.0–52.0)
Hemoglobin: 12.7 g/dL — ABNORMAL LOW (ref 13.0–17.0)
Hemoglobin: 14.6 g/dL (ref 13.0–17.0)
MCHC: 33.4 g/dL (ref 30.0–36.0)
MCV: 88.7 fL (ref 78.0–100.0)
Platelets: 173 10*3/uL (ref 150–400)
RBC: 4.27 MIL/uL (ref 4.22–5.81)
RBC: 4.94 MIL/uL (ref 4.22–5.81)
RDW: 14.4 % (ref 11.5–15.5)
WBC: 7.3 10*3/uL (ref 4.0–10.5)
WBC: 7.8 10*3/uL (ref 4.0–10.5)

## 2011-01-13 LAB — GLUCOSE, CAPILLARY
Glucose-Capillary: 103 mg/dL — ABNORMAL HIGH (ref 70–99)
Glucose-Capillary: 105 mg/dL — ABNORMAL HIGH (ref 70–99)
Glucose-Capillary: 116 mg/dL — ABNORMAL HIGH (ref 70–99)
Glucose-Capillary: 118 mg/dL — ABNORMAL HIGH (ref 70–99)
Glucose-Capillary: 76 mg/dL (ref 70–99)
Glucose-Capillary: 97 mg/dL (ref 70–99)

## 2011-01-13 LAB — HEMOGLOBIN A1C
Hgb A1c MFr Bld: 6.1 % (ref 4.6–6.1)
Mean Plasma Glucose: 128 mg/dL

## 2011-01-13 LAB — MAGNESIUM: Magnesium: 1.9 mg/dL (ref 1.5–2.5)

## 2011-01-20 ENCOUNTER — Encounter: Payer: Self-pay | Admitting: Family Medicine

## 2011-01-27 ENCOUNTER — Encounter: Payer: Self-pay | Admitting: Family Medicine

## 2011-01-27 ENCOUNTER — Ambulatory Visit (INDEPENDENT_AMBULATORY_CARE_PROVIDER_SITE_OTHER): Payer: Medicare Other | Admitting: Family Medicine

## 2011-01-27 VITALS — BP 136/78 | HR 78 | Temp 98.0°F | Ht 73.5 in | Wt 175.1 lb

## 2011-01-27 DIAGNOSIS — F329 Major depressive disorder, single episode, unspecified: Secondary | ICD-10-CM

## 2011-01-27 DIAGNOSIS — F0390 Unspecified dementia without behavioral disturbance: Secondary | ICD-10-CM

## 2011-01-27 DIAGNOSIS — E785 Hyperlipidemia, unspecified: Secondary | ICD-10-CM

## 2011-01-27 DIAGNOSIS — I10 Essential (primary) hypertension: Secondary | ICD-10-CM

## 2011-01-27 DIAGNOSIS — F3289 Other specified depressive episodes: Secondary | ICD-10-CM

## 2011-01-27 LAB — CBC
HCT: 44.4 % (ref 39.0–52.0)
Hemoglobin: 14.8 g/dL (ref 13.0–17.0)
RBC: 4.99 MIL/uL (ref 4.22–5.81)
RDW: 13.8 % (ref 11.5–15.5)

## 2011-01-27 LAB — URINALYSIS, ROUTINE W REFLEX MICROSCOPIC
Nitrite: NEGATIVE
Specific Gravity, Urine: 1.036 — ABNORMAL HIGH (ref 1.005–1.030)
Urobilinogen, UA: 0.2 mg/dL (ref 0.0–1.0)

## 2011-01-27 LAB — POCT CARDIAC MARKERS
CKMB, poc: <1 ng/mL — ABNORMAL LOW (ref 1.0–8.0)
Troponin i, poc: <0.05 ng/mL (ref 0.00–0.09)

## 2011-01-27 LAB — DIFFERENTIAL
Basophils Absolute: 0 10*3/uL (ref 0.0–0.1)
Eosinophils Relative: 2 % (ref 0–5)
Lymphocytes Relative: 23 % (ref 12–46)
Monocytes Absolute: 0.5 10*3/uL (ref 0.1–1.0)
Monocytes Relative: 9 % (ref 3–12)
Neutro Abs: 3.9 10*3/uL (ref 1.7–7.7)

## 2011-01-27 LAB — COMPREHENSIVE METABOLIC PANEL
AST: 19 U/L (ref 0–37)
BUN: 10 mg/dL (ref 6–23)
Calcium: 9.1 mg/dL (ref 8.4–10.5)
Chloride: 102 mEq/L (ref 96–112)
Creatinine, Ser: 1.1 mg/dL (ref 0.4–1.5)
GFR: 68.31 mL/min (ref >60.00)
Total Bilirubin: 1.1 mg/dL (ref 0.3–1.2)

## 2011-01-27 LAB — BASIC METABOLIC PANEL
CO2: 28 mEq/L (ref 19–32)
Calcium: 9.2 mg/dL (ref 8.4–10.5)
GFR calc Af Amer: >60 mL/min (ref >60)
GFR calc non Af Amer: >60 mL/min (ref >60)
Glucose, Bld: 129 mg/dL — ABNORMAL HIGH (ref 70–99)
Potassium: 3.3 mEq/L — ABNORMAL LOW (ref 3.5–5.1)
Sodium: 143 mEq/L (ref 135–145)

## 2011-01-27 LAB — VITAMIN B12: Vitamin B-12: 272 pg/mL (ref 211–911)

## 2011-01-27 LAB — LDL CHOLESTEROL, DIRECT: Direct LDL: 75.4 mg/dL

## 2011-01-27 MED ORDER — DONEPEZIL HCL 10 MG PO TABS: 10.0000 mg | ORAL_TABLET | Freq: Every day | ORAL | Status: DC

## 2011-01-27 NOTE — Patient Instructions (Signed)
Increase aricept (donepezil) to 10mg  daily (may take 2 pills of 5mg  until run out then new prescription will be one pill of 10 mg daily). We may have room to go up on zoloft (Sertraline) but will wait until next visit to discuss this. Blood work today to check on [employer].  Sounds like [name] has dementia. Watch out for ticks. Good to see you today, call clinic with questions.

## 2011-01-27 NOTE — Assessment & Plan Note (Signed)
Still awaiting records from PCP.  Received from cards. Check dLDL today as nonfasting.

## 2011-01-27 NOTE — Assessment & Plan Note (Signed)
PHQ-9 = 4, minimal depression.  Daughter has noticed improvement on zoloft, so continue at low dose 25mg 

## 2011-01-27 NOTE — Progress Notes (Signed)
  Subjective:    Patient ID: [name], male    DOB: Dec 25, 1931, 77 y.o.   MRN: [id number]  HPI CC: f/u and geriatric assessment  Presents with daughter and caregiver Oaklan Persons.  Here for geriatric assessment today.  Last visit started on zoloft 25mg  for possible depression - daughter noticing improvement, much better as far as getting out more and tolerating traffic and being around people.  Agitation with changes in environment have significantly decreased.    Had a bit of diarrhea and then a bit of constipation but that is resolved  Medications and allergies reviewed and updated as above. PMHx and SHx reviewed.  Review of Systems Per HPI    Objective:   Physical Exam  Vitals reviewed. Constitutional: He appears well-developed and well-nourished. No distress.  HENT:  Head: Normocephalic and atraumatic.  Eyes: EOM are normal.  Neck: Normal range of motion. Neck supple. Carotid bruit is not present.  Cardiovascular: Normal rate, regular rhythm, normal heart sounds and intact distal pulses.   No murmur heard. Pulmonary/Chest: Effort normal and breath sounds normal. No respiratory distress. He has no wheezes. He has no rales.  Skin: Skin is warm and dry. No rash noted.       Geriatric Assessment:  Activities of Daily Living:     Bathing-assisted    Dressing-independent    Eating-independent    Toileting-independent    Transferring-independent    Continence-independent Overall Assessment: independent  Instrumental Activities of Daily Living:     Transportation-dependent    Meal/Food Preparation-dependent    Shopping Errands-dependent    Housekeeping/Chores-dependent    Money Management/Finances-dependent    Medication Management-dependent    Ability to Use Telephone-dependent    Laundry-dependent Overall Assessment: dependent  MMSE: 9/25 (unable to do attention/calculation because difficulty with spelling and arithmetic but daughter states was able to do  this in past).  Alert while taking, good effort.  Naming and registration good, otherwise fails.  Assessment & Plan:

## 2011-01-27 NOTE — Assessment & Plan Note (Addendum)
Discussed, MMSE and PHQ-9 administered.   Minimal-mild depression Significant dementia.  Suspect vascular dementia - pt with h/o TIA.  Doubt Altzheimer type. Increase donepezil to 10mg  - has tolerated in past.  Advised to watch for GI upset. Check blood work to r/o reversible causes dementia.

## 2011-01-27 NOTE — Assessment & Plan Note (Signed)
Good control continued off antihypertensives.   Continue to monitor, check blood work today.

## 2011-01-28 ENCOUNTER — Telehealth: Payer: Self-pay | Admitting: Family Medicine

## 2011-01-28 DIAGNOSIS — E538 Deficiency of other specified B group vitamins: Secondary | ICD-10-CM

## 2011-01-28 NOTE — Telephone Encounter (Signed)
Please notify dementia labs overall returned normal - thyroid normal, kidneys and liver and bad cholesterol all normal range.  B12 was borderline low.  Would recommend either starting monthly shots or daily oral supplement - see which she wants to do (i'd probably recommend monthly shots for 6 months then daily oral supplement, then let me know and i'll input into chart).

## 2011-01-28 NOTE — Telephone Encounter (Signed)
Spoke with patient's daughter and notified her of results. Scheduled B-12 injections x 6 months.

## 2011-01-30 ENCOUNTER — Ambulatory Visit: Payer: Medicare Other | Admitting: Family Medicine

## 2011-01-30 DIAGNOSIS — Z0289 Encounter for other administrative examinations: Secondary | ICD-10-CM

## 2011-01-30 LAB — GLUCOSE, CAPILLARY: Glucose-Capillary: 105 mg/dL — ABNORMAL HIGH (ref 70–99)

## 2011-01-30 LAB — BASIC METABOLIC PANEL
CO2: 31 mEq/L (ref 19–32)
Chloride: 107 mEq/L (ref 96–112)
Creatinine, Ser: 1.23 mg/dL (ref 0.4–1.5)
GFR calc Af Amer: >60 mL/min (ref >60)
Sodium: 145 mEq/L (ref 135–145)

## 2011-01-30 LAB — HEPATIC FUNCTION PANEL
ALT: 16 U/L (ref 0–53)
AST: 19 U/L (ref 0–37)
Albumin: 3.8 g/dL (ref 3.5–5.2)
Alkaline Phosphatase: 66 U/L (ref 39–117)
Bilirubin, Direct: 0.3 mg/dL (ref 0.0–0.3)
Indirect Bilirubin: 1.3 mg/dL — ABNORMAL HIGH (ref 0.3–0.9)
Total Bilirubin: 1.6 mg/dL — ABNORMAL HIGH (ref 0.3–1.2)
Total Protein: 6.6 g/dL (ref 6.0–8.3)

## 2011-01-30 LAB — CBC
Hemoglobin: 14.4 g/dL (ref 13.0–17.0)
MCV: 90.7 fL (ref 78.0–100.0)
RBC: 4.61 MIL/uL (ref 4.22–5.81)
WBC: 8.2 10*3/uL (ref 4.0–10.5)

## 2011-01-30 LAB — URINALYSIS, ROUTINE W REFLEX MICROSCOPIC
Glucose, UA: 250 mg/dL — AB
Hgb urine dipstick: NEGATIVE
Ketones, ur: NEGATIVE mg/dL
Nitrite: NEGATIVE
Protein, ur: NEGATIVE mg/dL
Specific Gravity, Urine: 1.031 — ABNORMAL HIGH (ref 1.005–1.030)
Urobilinogen, UA: 1 mg/dL (ref 0.0–1.0)
pH: 5.5 (ref 5.0–8.0)

## 2011-01-30 LAB — ETHANOL: Alcohol, Ethyl (B): <5 mg/dL (ref 0–10)

## 2011-01-30 LAB — DIFFERENTIAL
Basophils Absolute: 0 10*3/uL (ref 0.0–0.1)
Basophils Relative: 0 % (ref 0–1)
Eosinophils Absolute: 0 10*3/uL (ref 0.0–0.7)
Eosinophils Relative: 0 % (ref 0–5)
Lymphocytes Relative: 22 % (ref 12–46)
Lymphs Abs: 1.8 10*3/uL (ref 0.7–4.0)
Monocytes Absolute: 0.6 10*3/uL (ref 0.1–1.0)
Monocytes Relative: 7 % (ref 3–12)
Neutro Abs: 5.7 10*3/uL (ref 1.7–7.7)
Neutrophils Relative %: 70 % (ref 43–77)

## 2011-01-30 LAB — RAPID URINE DRUG SCREEN, HOSP PERFORMED
Barbiturates: NOT DETECTED
Benzodiazepines: POSITIVE — AB
Cocaine: NOT DETECTED
Opiates: NOT DETECTED

## 2011-01-30 LAB — CK TOTAL AND CKMB (NOT AT ARMC)
CK, MB: 2.4 ng/mL (ref 0.3–4.0)
Relative Index: 1.5 (ref 0.0–2.5)
Total CK: 163 U/L (ref 7–232)

## 2011-01-30 LAB — SALICYLATE LEVEL: Salicylate Lvl: <4 mg/dL (ref 2.8–20.0)

## 2011-01-30 LAB — TROPONIN I: Troponin I: 0.01 ng/mL (ref 0.00–0.06)

## 2011-01-30 LAB — AMMONIA: Ammonia: 14 umol/L (ref 11–35)

## 2011-02-01 LAB — POCT CARDIAC MARKERS
Myoglobin, poc: 59.6 ng/mL (ref 12–200)
Troponin i, poc: <0.05 ng/mL (ref 0.00–0.09)

## 2011-02-01 LAB — COMPREHENSIVE METABOLIC PANEL
ALT: 17 U/L (ref 0–53)
Alkaline Phosphatase: 57 U/L (ref 39–117)
CO2: 27 mEq/L (ref 19–32)
Chloride: 107 mEq/L (ref 96–112)
GFR calc non Af Amer: >60 mL/min (ref >60)
Glucose, Bld: 104 mg/dL — ABNORMAL HIGH (ref 70–99)
Potassium: 3.6 mEq/L (ref 3.5–5.1)
Sodium: 140 mEq/L (ref 135–145)
Total Bilirubin: 2.3 mg/dL — ABNORMAL HIGH (ref 0.3–1.2)

## 2011-02-01 LAB — DIFFERENTIAL
Basophils Absolute: 0 10*3/uL (ref 0.0–0.1)
Basophils Relative: 1 % (ref 0–1)
Eosinophils Absolute: 0.1 10*3/uL (ref 0.0–0.7)
Monocytes Relative: 10 % (ref 3–12)
Neutrophils Relative %: 58 % (ref 43–77)

## 2011-02-01 LAB — TROPONIN I: Troponin I: 0.02 ng/mL (ref 0.00–0.06)

## 2011-02-01 LAB — CBC
Hemoglobin: 15.4 g/dL (ref 13.0–17.0)
RBC: 5.01 MIL/uL (ref 4.22–5.81)

## 2011-02-01 LAB — CK TOTAL AND CKMB (NOT AT ARMC): Relative Index: INVALID (ref 0.0–2.5)

## 2011-02-09 ENCOUNTER — Encounter: Payer: Self-pay | Admitting: Family Medicine

## 2011-02-13 ENCOUNTER — Ambulatory Visit (INDEPENDENT_AMBULATORY_CARE_PROVIDER_SITE_OTHER): Payer: Medicare Other | Admitting: Family Medicine

## 2011-02-13 DIAGNOSIS — E538 Deficiency of other specified B group vitamins: Secondary | ICD-10-CM

## 2011-02-13 MED ORDER — CYANOCOBALAMIN 1000 MCG/ML IJ SOLN
1000.0000 ug | Freq: Once | INTRAMUSCULAR | Status: AC
  Administered 2011-02-13: 1000 ug via INTRAMUSCULAR

## 2011-02-13 NOTE — Progress Notes (Signed)
b12 shot today. 

## 2011-02-17 ENCOUNTER — Other Ambulatory Visit: Payer: Self-pay | Admitting: *Deleted

## 2011-02-18 ENCOUNTER — Other Ambulatory Visit: Payer: Self-pay | Admitting: Family Medicine

## 2011-02-18 NOTE — Telephone Encounter (Signed)
Ok to refill 

## 2011-02-18 NOTE — Telephone Encounter (Signed)
This wasn't on his list previously, is this for insomnia, and how often does he take?  plz check with daughter.  Thanks.

## 2011-02-18 NOTE — Telephone Encounter (Signed)
Not on our list of meds.  What is this for, sleep, appetite?  Is he taking daily?  Was previous pcp prescribing?

## 2011-02-18 NOTE — Telephone Encounter (Signed)
Spoke with patient's daughter. She said Dr. Vear Clock put him on it and she wasn't sure what it was for. She doesn't think he needs it because he doesn't have any issues with sleep or appetite. She said to not refill it and if she notices anything "weird" going on with sleep or appetite, she will call and let us know.

## 2011-02-18 NOTE — Telephone Encounter (Signed)
See subsequent phone note.

## 2011-02-18 NOTE — Telephone Encounter (Signed)
Ok thanks.  Anticipate it was for depression/appetite, however now on zoloft and doing well.  Will stop remeron and monitor.

## 2011-02-22 ENCOUNTER — Other Ambulatory Visit: Payer: Self-pay | Admitting: Family Medicine

## 2011-02-27 ENCOUNTER — Ambulatory Visit (INDEPENDENT_AMBULATORY_CARE_PROVIDER_SITE_OTHER): Payer: Medicare Other | Admitting: Family Medicine

## 2011-02-27 ENCOUNTER — Encounter: Payer: Self-pay | Admitting: Family Medicine

## 2011-02-27 ENCOUNTER — Ambulatory Visit: Payer: Medicare Other

## 2011-02-27 VITALS — BP 124/74 | HR 60 | Temp 97.5°F | Wt 172.0 lb

## 2011-02-27 DIAGNOSIS — E538 Deficiency of other specified B group vitamins: Secondary | ICD-10-CM

## 2011-02-27 DIAGNOSIS — R911 Solitary pulmonary nodule: Secondary | ICD-10-CM | POA: Insufficient documentation

## 2011-02-27 DIAGNOSIS — L989 Disorder of the skin and subcutaneous tissue, unspecified: Secondary | ICD-10-CM

## 2011-02-27 DIAGNOSIS — J3489 Other specified disorders of nose and nasal sinuses: Secondary | ICD-10-CM

## 2011-02-27 DIAGNOSIS — F0390 Unspecified dementia without behavioral disturbance: Secondary | ICD-10-CM

## 2011-02-27 DIAGNOSIS — K1370 Unspecified lesions of oral mucosa: Secondary | ICD-10-CM

## 2011-02-27 HISTORY — DX: Unspecified lesions of oral mucosa: K13.70

## 2011-02-27 MED ORDER — CYANOCOBALAMIN 1000 MCG/ML IJ SOLN
1000.0000 ug | Freq: Once | INTRAMUSCULAR | Status: AC
  Administered 2011-02-27: 1000 ug via INTRAMUSCULAR

## 2011-02-27 MED ORDER — MUPIROCIN 2 % EX OINT: TOPICAL_OINTMENT | Freq: Two times a day (BID) | CUTANEOUS | Status: DC

## 2011-02-27 NOTE — Assessment & Plan Note (Signed)
Healing abrasion.  Not healing as quick as would like.  Recommend start mupirocin (+ family members with h/o MRSA) and not pick at skin at all. Update if not improved.

## 2011-02-27 NOTE — Assessment & Plan Note (Signed)
Yearlong.  Likely nonallergic rhinitis.  ? Vasomotor.  Pt will not tolerate nasal sprays per daughter.  Trial of another antihistamine (allegra). Alpha blockers can cause rhinorrhea, ?tamsulosin causing.

## 2011-02-27 NOTE — Progress Notes (Signed)
  Subjective:    Patient ID: [name], male    DOB: [birth year], 77 y.o.   MRN: [id number]  HPI CC: sore gums  No teeth, no dentures, likes to chew gum and likes to suck on hard candy.  Sore gums for weeks to months.  When backs down on gum and candy, gums aren't as sore.  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  claritin didn't help.  Doesn't do well with nasal sprays.  R forearm scratch by dog 2wks ago.  Was healing well then he started scratching at it.  Now slight redness around site, dry scab not healing.  Wt Readings from Last 3 Encounters:  02/27/11 172 lb 0.6 oz (78.037 kg)  01/27/11 175 lb 1.3 oz (79.416 kg)  12/26/10 173 lb 12 oz (78.812 kg)   Medications and allergies reviewed and updated as above. PMHx reviewed for relevance.  Review of Systems per HPI   Objective:   Physical Exam  Constitutional: He appears well-developed and well-nourished. No distress.  HENT:  Head: Normocephalic and atraumatic.  Right Ear: Hearing, external ear and ear canal normal.  Left Ear: Hearing, external ear and ear canal normal.  Nose: Rhinorrhea (clear) present. No mucosal edema or sinus tenderness. Right sinus exhibits no maxillary sinus tenderness and no frontal sinus tenderness. Left sinus exhibits no maxillary sinus tenderness and no frontal sinus tenderness.  Mouth/Throat: Uvula is midline, oropharynx is clear and moist and mucous membranes are normal. He does not have dentures.       Small shallow ulcer lower left gumline  Eyes: Conjunctivae and EOM are normal. Pupils are equal, round, and reactive to light. No scleral icterus.  Neck: Normal range of motion. Neck supple.  Skin: Skin is warm and dry. No rash noted.       Right medial proximal forearm abrasion with dry scab, minimal surrounding erythema, no calor.  No streaking.  Psychiatric: He has a normal mood and affect.       Much higher energy level today, much more talkative than in previous office visits.           Assessment & Plan:

## 2011-02-27 NOTE — Assessment & Plan Note (Signed)
Aphthous ulcer, treat with oragel.  rec back down on chewing gum/jolly ranchers. If not improved with these measures, return for further evaluation.

## 2011-02-27 NOTE — Assessment & Plan Note (Signed)
Energy level significantly improved, daughter attributing to B12 shots.  Also did increase aricept to 10mg  daily.

## 2011-02-27 NOTE — Patient Instructions (Signed)
For skin lesion - use mupirocin and don't pick at skin. For mouth - try orajel which is over the counter (numbing gel). For nose - try allegra (in am) or zyrtec (night time). Good to see you today, call us with questions.

## 2011-03-07 ENCOUNTER — Encounter: Payer: Self-pay | Admitting: Family Medicine

## 2011-03-11 ENCOUNTER — Encounter: Payer: Self-pay | Admitting: Family Medicine

## 2011-03-11 NOTE — H&P (Signed)
NAME:  [name], [name] NO.:  [id number]   MEDICAL RECORD NO.:  [id number]          PATIENT TYPE:  EMS   LOCATION:  [facility]                         FACILITY:  [facility]   PHYSICIAN:  Michael Iba, MD   DATE OF BIRTH:  [birth year]   DATE OF ADMISSION:  06/08/2009  DATE OF DISCHARGE:  06/08/2009                              HISTORY & PHYSICAL   CHIEF COMPLAINT:  Chest pain.   HISTORY OF PRESENT ILLNESS:  [name] is a 77 year old male from  [facility] who is followed by [name].  He has a history of  coronary artery disease.  He had bypass surgery in June 2006.  He was  previously followed by [facility] but is now followed by [place].  He  had a Myoview study in our office in August 2009 that was essentially  low risk.  EF was 57%.  The patient has not had a catheterization since  his bypass.  He saw [name] in the office in March of this year and  was doing well from a cardiac standpoint.  We did try to get him to quit  smoking, he does smoke couple of cigarettes a day, although he says he  has not had one since March.  He is admitted now through the emergency  room with chest pain.  He says this morning he bent over to look for his  glasses and had substernal chest pain which really started in the  stomach and radiated to his epigastrium.  He says it was worse when he  turned.  He took 2 aspirins and came to the emergency room.  He denies  any shortness of breath, nausea, vomiting, or diaphoresis.  In the  emergency room, he is better after 2 morphine.  His initial enzymes were  negative.  His EKG shows nonspecific ST changes.  The patient was seen  by [name] and myself in the emergency room.   PAST MEDICAL HISTORY:  Remarkable for PAF, he has on amiodarone.  He has  hypothyroidism, the patient had actually stopped taking his Synthroid on  his own a couple of months ago but has recently resumed it.  He has a  history of skin disorder  and is on daily doxycycline.  He has treated  hypertension and dyslipidemia.   CURRENT MEDICATIONS:  1. Amiodarone 200 mg a day.  2. Aspirin 325 mg a day.  3. Doxycycline 100 mg a day.  4. Imdur 30 mg a day.  5. Levothyroxine 0.05 mg a day.  6. Lisinopril 10 mg a day.  7. Lovastatin 40 mg a day.   He has no known drug allergies.   SOCIAL HISTORY:  He used to be fairly heavy binge drinker.  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  He was previously living alone but  now lives with a friend and his daughter.  He says he has not had  anything to drink in a year.   FAMILY HISTORY:  Unremarkable for early coronary artery disease.   REVIEW OF SYSTEMS:  He has had  a history of previous gastric polyps  which were removed, this was several years ago in [place].  Review of  systems otherwise unremarkable except for noted above.   PHYSICAL EXAMINATION:  VITAL SIGNS:  Blood pressure 108/74, pulse 64,  and temperature 97.6.  GENERAL:  He is a well-developed, thin, elderly male in no acute  distress.  HEENT:  Normocephalic and atraumatic.  Extraocular movements are intact.  Sclerae are nonicteric.  He does wear glasses.  NECK:  Without JVD or bruit.  CHEST:  Clear to auscultation and percussion.  CARDIAC:  Regular rate and rhythm without murmur, rub, or gallop.  Normal S1 and S2.  ABDOMEN:  Nontender.  No hepatosplenomegaly.  EXTREMITIES:  Without edema.  Distal pulses are 3+/4 bilaterally.  No  femoral bruits noted.  NEURO:  Grossly intact.  He is awake, alert, oriented, and cooperative,  he is a rambling historian and mild baseline dementia has been  questioned in the past.   LABORATORY DATA:  His initial troponin is negative.  His hematology  profile is within normal limits.  His EKG shows sinus rhythm with  nonspecific changes.  A TSH and CMET are pending.   IMPRESSION:  1. Chest pain, atypical for unstable angina.  2. Known coronary artery disease with coronary  artery bypass grafting      in June 2006 with negative Myoview of August 2009.  3. Preserved left ventricular function.  4. Paroxysmal atrial fibrillation, holding sinus rhythm on amiodarone,      he does have a history of sinus bradycardia in 2008 with weakness      which was felt secondary to beta-blockers.  5. Normal left ventricular function.  6. History of smoking, the patient says he has now stopped.  7. Prior heavy ethyl alcohol use, the patient has not had alcohol in a      year.   PLAN:  The patient was seen by [name] and myself in the emergency  room.  [name] would like to get a second enzyme on him.  If this is  negative, we will go ahead and let him go home with a prescription for  nitroglycerin sublingual p.r.n..  We will see him back in the office in  a couple weeks in followup.  We did order a TSH and that is pending.      Abelino Derrick, P.A.      Michael Iba, MD  Electronically Signed    LKK/MEDQ  D:  06/08/2009  T:  06/08/2009  Job:  [id number]   cc:   Loma Madden

## 2011-03-11 NOTE — Discharge Summary (Signed)
NAME:  ANDRUE, DINI NO.:  [id number]   MEDICAL RECORD NO.:  [id number]          PATIENT TYPE:  INP   LOCATION:  4736                         FACILITY:  [facility]   PHYSICIAN:  Michaelyn Barter, M.D. DATE OF BIRTH:  Mar 26, 1932   DATE OF ADMISSION:  10/21/2007  DATE OF DISCHARGE:  10/23/2007                               DISCHARGE SUMMARY   PRIMARY CARE PHYSICIAN:  Primary care doctor unassigned.   FINAL DIAGNOSES:  1. Gait ataxia.  2. Atypical chest discomfort.  3. Bradycardia.  4. History of alcohol abuse.   PROCEDURES:  1. Portable chest x-ray completed October 21, 2007.  2. CT scan of the head without contrast October 22, 2007.  3. CT scan of the chest with angiographic contrast October 22, 2007.  4. MRI of the brain without contrast October 23, 2007.   HISTORY OF PRESENT ILLNESS:  Mr. Marney is a 77 year old gentleman who  stated that he began to feel weak a few days prior to his admission.  He  indicated that his weakness had progressed in both of his lower  extremities.  He began to feel as if he was going to fall.  On the  morning of admission he attempted to walk to the bathroom and described  his gait as a staggering type of gait.  He admitted to being a heavy  alcohol drinker in the past but indicated that he had not drank alcohol  for at least 2 weeks, however, he was walking as if he was drunk.  He  also had begun to experience falls over the days leading up to this  admission.  He complained of a vague funny feeling within his chest.  It  is very difficult for him to describe the sensation but he indicated  that it feels as if he was going to throw up.  He denied having any  chest pain per se.  Likewise, he had no shortness of breath and no  cough.   PAST MEDICAL HISTORY:  For past medical history please see that dictated  by Dr. Michaelyn Barter.   HOSPITAL COURSE:  1. Ataxic gait.  The etiology of this is questionable.  A  CT scan of      the patient's head was completed on December 26.  A tiny area of      lucency in the left side of the pons was noted.  The etiology of      that was questionable.  An MRI of the brain was done on December 27      which revealed no acute infarct, no hydrocephalus.  Physical      therapy was consulted and the patient ambulated well with a single      point cane.  They stated that the patient will likely be able to be      discharged home with home health PT followup.  The patient has      indicated that he has begun to feel better.  2. Vague chest discomfort.  The patient's description of his symptoms      were very  atypical.  A portable chest x-ray was done on December 25      and it revealed stable exam, COPD type changes, multiple old left      rib fracture deformities were noted.  A CT scan of the chest with      angiographic contrast was done.  No acute disease in the chest was      seen.  There was no evidence of a pulmonary emboli, mass or      infiltrate or other significant abnormalities.  The patient's      cardiac markers were cycled.  His troponin-I was found to be 0.02,      0.04 and 0.01.  Likewise, CK-MBs were also found to be 1.1, 1.6 and      1.2.  Therefore, he ruled out for a myocardial infarction by      cardiac markers.  An EKG revealed sinus bradycardia but no other      obvious significant abnormalities.  The patient's chest related      symptoms resolved almost immediately after his admission into the      hospital and by the next day the patient indicated that he was no      longer having any chest discomfort.  3. Bradycardia.  The patient's heart rate at the time of admission was      noted to have been in the upper 40s.  He was on a beta-blocker      previously.  The beta-blocker was held.  The patient's heart rate      remained in the upper 40s and 50s.  He was otherwise asymptomatic.      Therefore, no other additional workup was pursued.  4. History of  alcohol abuse.  The patient's description of his alcohol      consumption sounded as if he was a binge drinker.  He displayed no      obvious alcohol withdrawal during the course of this      hospitalization.  He was provided with thiamine, folic acid and      multivitamins.   Condition at the time of discharge is stable.  Currently the patient has  no new complaints and states that he feels better and he requests to be  discharged home.  His temperature is 98.4, heart rate 50, respirations  20, blood pressure 114/67, O2 sat 96% on room air.  The patient's  ammonia level was 29.  Sodium 141, potassium 4.1, chloride 104, CO2 30,  glucose 93, BUN 9, creatinine 1.12, calcium 9.0.  The decision has been  made to discharge the patient from the hospital.  The patient will be  told to resume his prior home medications which consist of:   MEDICATIONS:  1. Amiodarone HCl 200 mg 1 tablet p.o. daily.  2. Omeprazole 20 mg p.o. daily.  3. Isosorbide 30 mg p.o. daily.  4. Aspirin 325 mg p.o. daily.  5. Prednisolone eye drops.  6. Fluorometholone eye drops.   He will told not to take his lovastatin until he sees his regular doctor  and also not to take his metoprolol secondary to slow heart beat.      Michaelyn Barter, M.D.  Electronically Signed     OR/MEDQ  D:  10/23/2007  T:  10/24/2007  Job:  [id number]

## 2011-03-11 NOTE — H&P (Signed)
NAME:  RAEDEN, SCHIPPERS NO.:  [id number]   MEDICAL RECORD NO.:  [id number]          PATIENT TYPE:  EMS   LOCATION:  MAJO                         FACILITY:  [facility]   PHYSICIAN:  Michaelyn Barter, M.D. DATE OF BIRTH:  1931-12-09   DATE OF ADMISSION:  10/21/2007  DATE OF DISCHARGE:                              HISTORY & PHYSICAL   PRIORITY ADMISSION HISTORY AND PHYSICAL   PRIMARY CARE PHYSICIAN:  Unassigned.   CHIEF COMPLAINT:  I feel bad and my legs are both weak.   HISTORY OF PRESENT ILLNESS:  Mr. Pinkerton is a 77 year old gentleman  with a past medical history of hypertension and hyperlipidemia.  He  states that a few days ago his legs began to feel weak.  Over the past  couple of days, he has developed progressive weakness within both of his  lower extremities.  He indicates that he has began to feel as if he is  going to fall each time he attempts to walk.  This morning he attempted  to walk to the bathroom and felt as if he was staggering.  He indicates  that, although he has not had a drink of alcohol over the past couple of  weeks, he feels as if he is walking as though he were drunk.  He also  states that he has had several falls over the past several days.  He  walks sideways.  He complains of a funny feeling in his chest.  His  description of the feeling is very vague; however, he said that he gets  this sensation in his chest as if he is going to throw up.  He denies  having any shortness of breath, no cough, and no chest pain.  He also  indicates that his bowels have been irregular over the past several  days.  Approximately three weeks ago, he was treated at [facility] secondary to diarrhea, however, over the past four days he has  been suffering from constipation.  He recently took some Milk of  Magnesia to help with this; however, it only produced watery stools.  He  mentions several times that he feels bad and also several times that his  feeling bad has caused him to think about killing himself.  His daughter  later indicated that the patient has been having some dizziness over the  past couple of days.  There has also been a complaint of weight loss  over the past month or so.   PAST MEDICAL HISTORY:  1. Coronary artery disease.  2. Type 2 diabetes mellitus.  3. Hypertension.  4. Hyperlipidemia.  5. Chronic atrial fibrillation.  6. Gastric polyp.  7. Dizziness secondary to bradycardia and overuse of Metoprolol.  8. Sinus bradycardia secondary to beta blockers.  9. Constipation.  10.Chronic pancreatitis.  11.Questionable dementia.   PAST SURGICAL HISTORY:  CABG.  The patient also had a cardiac  catheterization completed April 21, 2005, which revealed severe left main  stenosis as well as an ejection fraction of 65%.  The patient had a  coronary artery bypass graft involving four vessels  completed April 22, 2005, by Dr. Kathlee Nations Trigt.   ALLERGIES:  CODEINE.   CURRENT MEDICATIONS:  1. Aspirin.  2. Lovastatin 40 mg daily.  3. Amiodarone 200 mg daily.  4. Toprol-XL 50 mg daily.  5. Omeprazole 20 mg daily.   SOCIAL HISTORY:  Cigarettes:  The patient currently admits to smoking  two to five cigarettes a day.  Alcohol:  The patient started drinking  alcohol at the age of 77.  He indicates that he is a binge drinker, and  the last time he was drank was approximately three weeks ago at which  time he consumed 10 beers.   FAMILY HISTORY:  Mother was killed in a motor vehicle accident at the  age of 54.  Father's medical history is unknown.  The patient's brother  died from a self-inflicted gunshot wound, i.e., he committed suicide.   REVIEW OF SYSTEMS:  As per the HPI.   PHYSICAL EXAMINATION:  GENERAL:  The patient is awake, he is  cooperative, he is in no obvious respiratory distress.  VITAL SIGNS:  His temperature is 97.1, blood pressure 116/61, heart rate  50, respirations 22, O2 SAT is 97%.  HEENT:   Normocephalic, atraumatic, anicteric, extraocular movements are  intact, oral mucosa is pink, no thrush and no exudates, all of the  patient's teeth are missing.  NECK:  Supple, no lymphadenopathy, no thyromegaly, no JVD.  CARDIAC:  Heart sounds are distant.  CHEST:  There is a well-healed, centrally located scar within the  patient's sternum.  ABDOMEN:  Flat, soft, nontender, and nondistended, positive bowel  sounds, no masses, no hepatosplenomegaly.  EXTREMITIES:  No leg edema.  NEUROLOGICAL:  The patient is alert and oriented x2 to person and place.  He indicates that he has no idea of the year and actually stated that it  was in the 1900s.  MUSCULOSKELETAL:  5 out of 5 upper and lower extremity strength.   LABORATORY DATA:  Alcohol was less than 5.  Bilirubin total 1.4,  bilirubin direct 0.2, indirect bilirubin is 1.2, alkaline-phosphatase  78, SGOT 21, SGPT 17, total protein 6.2, albumin 3.7.  CK-MB, TLC less  than 1.0, troponin-I, TLC less than 0.05, myoglobin, TLC 35.3,  creatinine is 1.3.  ABG:  pH of 7.334, pCO2 of 54.5, bicarbonate 29.0.  Hemoglobin 16.0 and hematocrit 47.0.  Sodium 141, potassium 4.2,  chloride 109, glucose 131, BUN 16.  EKG reveals sinus bradycardia, no Q-  waves, that is a nonpathologic Q-wave in lead 3.   ASSESSMENT/PLAN:  1. Gait ataxia:  The etiology of this is unknown.  The differential      includes an acute cerebral event versus the effects of chronic      alcohol abuse versus the effects of long-term statin usage.  Will      check a computerized tomography scan of the patient's head for now      as well as B12, folic acid, RPR, and TSH.  Will hold the patient's      statin and will consult Physical Therapy as well as Occupational      Therapy.  2. Vague chest discomfort:  The etiology of this is questionable.  The      patient's description of his chest discomfort sounds atypical.  It      sounds as if the patient may also be suffering from       gastroesophageal reflux disease.  Will check the patient's cardiac  markers including a troponin-I plus CK-MB x3 q.8h apart.  In light      of the patient having a history of cigarette abuse as well as      weight loss, will order a computerized tomography of the chest to      rule out any type of a mass as well as a pulmonary embolism.  3. Bradycardia:  This is most likely secondary to the patient's beta      blockers that he is currently using.  Will consider holding the      patient's beta blockers for now versus decreasing the dose.  4. Hypertension:  This is currently stable.  5. History of alcohol abuse:  The patient currently does not      demonstrate any obvious alcohol withdrawal symptoms.  Will start      the patient on Thiamine, folic acid, and a multivitamin.  6. Gastrointestinal prophylaxis:  Will provide Protonix.  7. Deep venous thrombosis prophylaxis:  Will provide Lovenox.      Michaelyn Barter, M.D.  Electronically Signed     OR/MEDQ  D:  10/21/2007  T:  10/21/2007  Job:  [id number]

## 2011-03-14 NOTE — H&P (Signed)
NAME:  [name], [name] NO.:  [id number]   MEDICAL RECORD NO.:  [id number]          PATIENT TYPE:  INP   LOCATION:  1829                         FACILITY:  [facility]   PHYSICIAN:  Michael Shirk, MD     DATE OF BIRTH:  Jun 11, 1932   DATE OF ADMISSION:  05/31/2005  DATE OF DISCHARGE:                                HISTORY & PHYSICAL   CHIEF COMPLAINT:  Dizziness upon ambulation x2 days.   HISTORY OF PRESENT ILLNESS:  Mr. [name] is a pleasant, 77 year old,  Caucasian gentleman with a history of alcohol abuse and coronary artery  disease, status post four-vessel CABG on April 22, 2005, by Dr. Kathlee Nations  Madden, who presents to the emergency department of [facility] with a two-day  history of dizziness upon standing up.  The patient states that after the  surgery he was living with his stepdaughter.  Subsequently, he felt well and  moved into an apartment by himself.  The patient states, for the past two  days, he has been dizzy upon standing up.  He had one fall without any loss  of consciousness.  The patient has difficulty even walking to the bathroom.  He has been staying in bed most of the time; decreased appetite, decreased  fluid intake, and has been eating mainly sandwiches.  He denies any nausea,  vomiting, fevers or chills.  He has been constipated in the last two days  and had to take a laxative upon which he had a bowel movement with hard  stool.  The patient has chronic back pain x30 years.  He has had some spurs  and a pinched nerve in his lower back and has been evaluated by neurosurgery  and deemed nonoperable; these records are not available at the present time.   In the ED, the patient was found to be orthostatic by blood pressure with  symptoms.  He was also noticed to be bradycardic.  Of note, the patient had  an episode of atrial fibrillation with rapid ventricular response postop for  which he was on amiodarone.   PAST MEDICAL HISTORY:  Significant  for:  1.  Coronary artery disease, status post four-vessel CABG in June 2006.  2.  Hypertension.  3.  Critical hyperlipidemia.  4.  Question of type 2 diabetes mellitus.  5.  History of alcohol abuse since age 49, drinking about a case of beer a      night.  6.  Marijuana abuse.  7.  Question of chronic pancreatitis.  8.  Questionable dementia.   SOCIAL HISTORY:  The patient lives by himself.  Denies any alcohol use since  his CABG; however, he does not remember if he had one or two drinks after  his surgery nor cigarette or illicit drug use.   FAMILY HISTORY:  Significant for mother dying of emphysema at age 32 and  father dying of cerebral aneurysm at age 96.   REVIEW OF SYSTEMS:  The patient is complaining of chronic back pain and has  some mild abdominal discomfort.  Other than that and HPI, essentially  negative review of systems.  Greater than 10 systems reviewed.   PHYSICAL EXAMINATION ON ADMISSION:  Blood pressure 105/63, pulse 55,  respirations 20, and temperature 97.5.  GENERAL:  Pleasant, elderly, Caucasian gentleman, lying in bed in no acute  distress.  Alert and oriented x3.  Cooperative with the exam.  HEENT:  Normocephalic, atraumatic.  PERRLA, sclerae anicteric.  Mucous  membranes moist.  NECK:  Supple.  No LAD, no JVD.  LUNGS:  Clear to auscultation bilaterally.  No wheezes, no rales.  CARDIOVASCULAR SYSTEM:  S1 plus S2.  Bradycardic.  No murmurs, rubs, or  gallops.  ABDOMEN:  Soft; positive bowel sounds; no tenderness; no masses.  EXTREMITIES:  No clubbing, cyanosis, or edema.  NEUROLOGIC EXAM:  Cranial nerves II-XII grossly intact.  Sensory/motor:  Within normal limits.  No focal deficits seen.   LABORATORY DATA:  Orthostatics:  Blood pressure sitting 104/60, standing  86/55 with symptoms, heart rate going from 55 to 58, and O2 saturations 97%  during this time.   WBC 7.9, hemoglobin 12.1, hematocrit 35.8, and platelets 261.  Sodium 139,  potassium 3.2,  chloride 112, carbon dioxide 21, glucose 119, BUN 20, and  creatinine 1.4.  Total bilirubin 0.7, direct bilirubin 0.1, indirect  bilirubin 0.6, alkaline phosphatase 64, AST 14, ALT less than 8, albumin  3.5, calcium 8.9, lipase 23, and amylase 71.  Urinalysis:  Negative.  EKG:  Sinus bradycardia.  Nonspecific ST-T changes.   ASSESSMENT AND PLAN:  A 77 year old Caucasian gentleman with a history of  coronary artery disease, status post four-vessel CABG in June 2006,  presenting with some complaints of dizziness upon exertion.   1.  Symptomatic orthostatic hypotension.  The patient was fluid resuscitated      in the ED.  He has still continued to be orthostatic.  We will continue      his IV hydration at 75 mL/hour of normal saline.  We will also check a      TSH and a fasting lipid profile.  A 2-D echo will also be checked.  2.  Bradycardia.  The patient's heart rate has been in the 50s.  He was on      amiodarone 200 mg p.o. b.i.d. and metoprolol 25 mg p.o. b.i.d.  We have      held his metoprolol and amiodarone for now.  A cardiology consult will      be requested.  The patient was seen by Dr. Antoine Poche of [facility] in June.  We will have [facility] reevaluate the      patient and advise [place] on further management of this bradycardia.  It is      unclear at this time if the bradycardia is due to his medications.  It      is also unclear if the bradycardia is contributing to his dizziness.  3.  Hyperlipidemia.  We will resume his Zocor at 20 mg p.o. daily.  4.  History of alcohol abuse.  We will give the patient thiamine, folate,      and an MVI in his IV fluids.  5.  Question of diabetes mellitus.  We will keep the patient on a sliding      scale insulin with Accu-Cheks a.c. h.s.  We also will have him on a      carbohydrate-modified diet.   DISPOSITION:  Disposition is likely to be problematic.  The patient insists on wanting to live alone; however, he was  advised  that he may need to live  either with someone or have someone live with him or he may then have to  consider moving to an assisted living facility for a short period of time  until he is well enough to live by himself.  The patient has agreed to talk with our social workers.  Case management  consult has been requested.  After resolution of acute symptoms, the patient  will be discharged either to home or to an assisted living as determined.  PT/OT consults have also been requested.       GDK/MEDQ  D:  06/01/2005  T:  06/01/2005  Job:  [id number]   cc:   Dwana Melena, M.D.  [facility]   Kerin Perna, M.D.  [address]  [place]  [place] [address]

## 2011-03-14 NOTE — Cardiovascular Report (Signed)
NAMEBEAUFORD, LANDO NO.:  [id number]   MEDICAL RECORD NO.:  [id number]          PATIENT TYPE:  INP   LOCATION:  2035                         FACILITY:  [facility]   PHYSICIAN:  Salvadore Farber, M.D. LHCDATE OF BIRTH:  May 19, 1932   DATE OF PROCEDURE:  04/21/2005  DATE OF DISCHARGE:                              CARDIAC CATHETERIZATION   PROCEDURE:  Left heart catheterization, left ventriculography, coronary  angiography.   INDICATIONS:  Mr. Calvin is a 77 year old gentleman with ongoing alcohol  abuse who presents with unstable angina.  He ruled out for a myocardial  infarction by serial enzymes.  He unfortunately has continued to have  episodes of chest discomfort while hospitalized and on maximal medical  therapy.  He was then referred for diagnostic angiography.   PROCEDURAL TECHNIQUE:  Informed consent was obtained.  Under 1% lidocaine  local anesthesia, a 5-French sheath was placed in the right common femoral  artery using the modified Seldinger technique.  Diagnostic angiography was  performed initially of the left system using a 5-French JL4 catheter.  With  engagement of the left main, there was severe damping of the catheter.  With  injections there was no reflux of contrast into the aorta.  Contrast  injections were therefore kept to a minimum.  Diagnostic angiography of the  right system was then performed using a JR-4.  Left heart catheterization  and ventriculography was performed using a pigtail catheter.  The patient  tolerated the procedure well and was transferred to the holding room in  stable condition.  Sheaths will be removed there.   COMPLICATIONS:  None.   FINDINGS:  1.  LV:  122/0/5.  EF is approximately 65% without regional wall motion      abnormality.  2.  No aortic stenosis or mitral regurgitation.  3.  Left main:  There is an ostial 80% stenosis extending through the      proximal half of the vessel.  4.  LAD:  Moderate sized  vessel giving rise to two large diagonals.  The      second diagonal has a 70% ostial stenosis.  5.  Circumflex:  Moderate sized vessel giving rise to a single large      branching obtuse marginal.  It is angiographically normal.  6.  RCA:  Moderate sized dominant vessel.  There is a 40% stenosis of its      ostium.  The PDA bifurcates into two parallel vessels.  These each have      approximately 70% stenoses at their ostia.   IMPRESSION/RECOMMENDATIONS:  The patient has severe left main stenosis as  his culprit lesion.  He also has significant disease of the PDA.  We will  refer for consideration of CABG.  In the interim, we will continue with  heparin, beta-blocker, and nitro paste.       WED/MEDQ  D:  04/21/2005  T:  04/21/2005  Job:  [id number]   cc:   Dr. Lynn Ito, [place]   Dr. Aurora Mask  [place], [place]
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
## 2011-03-14 NOTE — Discharge Summary (Signed)
NAME:  [name], [name]               ACCOUNT NO.:  [id number]   MEDICAL RECORD NO.:  [id number]          PATIENT TYPE:  INP   LOCATION:  5501                         FACILITY:  [facility]   PHYSICIAN:  Mobolaji B. Bakare, M.D.DATE OF BIRTH:  1932-08-16   DATE OF ADMISSION:  05/31/2005  DATE OF DISCHARGE:  06/02/2005                                 DISCHARGE SUMMARY   PRIMARY CARE PHYSICIAN:  [facility] in [place]   CARDIOLOGIST:  Dr. Milta Deiters in [place]   FINAL DIAGNOSES:  1.  Dizziness secondary to bradycardia and overuse of metoprolol.  2.  Sinus bradycardia secondary to beta blocker (metoprolol).  3.  Constipation.  4.  Status post coronary artery bypass graft.   BRIEF HISTORY:  [name] is a pleasant 77 year old white male who  recently had four vessel bypass on June 27 and he was discharged home in a  stable condition.  He presented to the emergency department with dizziness  upon ambulation.  There was no tinnitus or vertigo.  Was slightly  orthostatic on arrival.   Apparently, [name] has been using multiple medications, in particular,  he has been using metoprolol 50 mg b.i.d. in addition to 25 mg b.i.d. which  was prescribed to him on discharge post CABG.  He stated that his girlfriend  takes care of his medications for him.   Vitals on admission:  Blood pressure 103/63, pulse of 55, respiratory rate  of 20, temperature of 97.5.  He was mildly orthostatic.   PHYSICAL EXAMINATION:  Unremarkable except for bradycardia and orthostatics.   HOSPITAL COURSE:  [name] was admitted.  There was no neurological  deficit.  [name] was admitted and started on gentle IV hydration.  Metoprolol was held.  His symptoms improved.  He was seen by physical  therapist and he was cleared as physical and occupational therapist.  Patient was able to ambulate without symptoms nor assistance.  Home health  R.N. was instructed to follow up with patient regarding his  medications.   CONDITION ON DISCHARGE:  He was hemodynamically stable with a pulse rate of  58 and blood pressure of 102/74, saturating at 95% on room air.   DISCHARGE MEDICATIONS:  1.  Amiodarone 200 mg p.o. b.i.d.  2.  Prevacid 30 mg p.o. daily.  3.  Simvastatin 20 mg once a day.  4.  Aspirin 325 mg once a day.   SPECIFIC INSTRUCTIONS:  1.  Stop cephalexin, omeprazole, lovastatin, and metoprolol.  2.  Follow up with Dr. Welton Flakes at [facility].   RECOMMENDATIONS:  Metoprolol can be restarted when patient follows up at  [facility] with his regular doctor.   PROCEDURES:  2-D echocardiogram which showed overall left ventricular  systolic function was at lower limit of normal with ejection fraction of 50-  55%.  There was no significant valvular abnormality.  Patient had a carotid  ultrasound in June 2006 which was essentially normal.      Mobolaji B. Corky Downs, M.D.  Electronically Signed     MBB/MEDQ  D:  06/09/2005  T:  06/09/2005  Job:  [id number]   cc:   Freda Munro  [address]  [place]  [place] [address]  Fax: [phone]   [facility]

## 2011-03-14 NOTE — Op Note (Signed)
NAME:  [name], [name] NO.:  [id number]   MEDICAL RECORD NO.:  [id number]          PATIENT TYPE:  INP   LOCATION:  2312                         FACILITY:  [facility]   PHYSICIAN:  Kerin Perna, M.D.  DATE OF BIRTH:  [birth year]   DATE OF PROCEDURE:  04/22/2005  DATE OF DISCHARGE:                                 OPERATIVE REPORT   OPERATION:  Coronary artery bypass grafting x4 (left internal mammary artery  to left anterior descending, saphenous vein graft to diagonal, saphenous  vein graft to circumflex marginal, saphenous vein graft to posterior  descending).   PREOPERATIVE DIAGNOSIS:  Class IV unstable angina with severe three-vessel  coronary artery disease.   POSTOPERATIVE DIAGNOSIS:  Class IV unstable angina with severe three-vessel  coronary artery disease, left main stenosis.   SURGEON:  Kerin Perna, M.D.   ASSISTANT:  [name] Ceo, PA-C   ANESTHESIA:  General by Dr. Jairo Ben.   INDICATIONS:  The patient is a 77 year old male who was admitted to the  hospital with severe angina and a syncopal episode.  He underwent cardiology  evaluation which led to a cardiac cath.  This demonstrated a 80% ostial left  main stenosis with a high-grade stenosis of the posterior descending.  His  ejection fraction was well-preserved.  His co-morbid medical problems  included chronic lung disease, alcohol abuse, and hypertension.  Based on  his coronary anatomy and symptoms, he was felt to be a candidate for  surgical revascularization.  I examined the patient in his hospital room  following his cardiac cath and reviewed the results of the cardiac  catheterization with the patient.  I discussed the indications and expected  benefits of coronary artery bypass surgery for treatment of his coronary  artery disease.  I reviewed the alternatives to surgical therapy as well.  I  discussed with the patient the details of the procedure including the choice  of conduit  for grafts, the location of the surgical incisions, the use of  general anesthesia and cardiopulmonary bypass, and the expected  postoperative hospital recovery.  I discussed with the patient the risks to  him of coronary artery bypass surgery including the risks of MI, CVA,  bleeding, infection, and death.  He understood these implications for the  surgery and agreed to proceed with the operation as planned under what I  felt was an informed consent.   OPERATIVE FINDINGS:  The patient's saphenous vein was harvested from both  legs.  There was no identifiable vein in the right leg.  The mammary artery  was a good conduit with good flow.  The patient was given no blood products  for the operation.  The coronaries were small, but were adequate targets.  The circumflex was intramyocardial.   PROCEDURE:  The patient was brought to the operating room and placed supine  on the operating table where general anesthesia was induced under invasive  hemodynamic monitoring.  The chest, abdomen and legs were prepped with  Betadine and draped as a sterile field.  The sternal incision was made as  the saphenous vein  was harvested from the left leg using endoscopic  technique.  The vein was identified in the right leg, but not harvested.  It  was too small to use from the right leg.  The left internal mammary artery  was harvested as a pedicle graft from its origin at the subclavian vessels  and was a good vessel with excellent flow.  Heparin was administered for the  aprotinin protocol used for this operation.  The ACT was documented as being  therapeutic.  A sternal retractor was placed and the pericardium was opened  and suspended.  Pursestrings were placed in the ascending aorta and right  atrium, and the patient was cannulated and placed on bypass.  The coronaries  were identified for grafting.  The mammary artery and vein grafts were  prepared for the distal anastomoses.  Cardioplegia catheters  were placed for  both antegrade aortic and retrograde coronary sinus cardioplegia.  The  patient was cooled to 32 degrees.  The aortic crossclamp was applied.  Eight  hundred milliliters of cold blood cardioplegia were delivered to the aortic  root with good cardioplegic arrest.  The septal temperature dropped to less  than 12 degrees and there was a good cardioplegic arrest.   The distal coronary anastomoses were then performed.  The first distal  anastomosis was to the posterior descending.  There is a lesion of  approximately 80% in the proximal segment posterior descending and beyond  this, the vessel was small but graftable.  A reversed saphenous vein was  sewn end-to-side with a running 7-0 Prolene; there was good flow through the  graft.  The second distal anastomosis was to the diagonal.  This was a 1.5-  mm vessel with proximal 90% stenosis.  A reversed saphenous vein was sewn  end-to-side with a running 7-0 Prolene with good flow through the graft.  The third distal anastomosis was the circumflex marginal.  This was a 1.53-  mm vessel with proximal 80% calcified stenosis.  A reversed saphenous vein  was sewn end-to-side with a running 7-0 Prolene with good flow through the  anastomosis.  Cardioplegia was redosed through the grafts.   The fourth distal anastomosis was distal to the LAD.  This was a 1.5-mm  vessel with proximal 80% left main stenosis.  The left internal mammary  artery pedicle was brought through an opening created in the left lateral  pericardium and was brought down the LAD and sewn end-to-side with a running  8-0 Prolene.  There was good flow through the anastomosis after briefly  releasing the pedicle bulldog on the mammary pedicle.  The pedicle was then  secured to the epicardium.  Cardioplegia was redosed.  While the crossclamp  was still in place, 3 proximal vein anastomoses were placed on the ascending aorta using a 4.0-mm punch with running 6-0 Prolene.   Prior to release of  the crossclamp, air was evacuated from the coronaries and the left side of  the heart using a dose of retrograde warm blood cardioplegia.  The  crossclamp was then removed.   The heart resumed a spontaneous rhythm.  Air was aspirated from the vein  grafts using a 27-gauge needle.  The bypass grafts were checked and found to  be hemostatic with good flow.  The patient was rewarmed and reperfused.  Temporary pacing wires were applied.  The lungs were re-expanded and the  ventilator was resumed.  The patient was then weaned from bypass without  difficulty on renal-dose dopamine.  Blood pressure and cardiac output were  stable.  Protamine was administered without adverse reaction.  The cannulas  were removed.  The mediastinum was irrigated with warm antibiotic  irrigation.  The leg incision was irrigated and closed in a standard  fashion.  The pericardial fat was closed superiorly.  Two mediastinal and a  left pleural chest tube were placed and brought out  through separate incisions.  This sternum was closed with interrupted steel  wire.  The pectoralis fascia was closed in running #1 Vicryl.  The  subcutaneous and skin layers were closed in running Vicryl and sterile  dressings were applied.  Total bypass time was 140 minutes with crossclamp  time of 80 minutes.       PV/MEDQ  D:  04/22/2005  T:  04/23/2005  Job:  [id number]   cc:   [facility]   Salvadore Farber, M.D. [facility]  [address]  [address]  [place]  [place] [address]
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
## 2011-03-14 NOTE — Discharge Summary (Signed)
[name], GROFT NO.:  [id number]   MEDICAL RECORD NO.:  [id number]          PATIENT TYPE:  INP   LOCATION:  3732                         FACILITY:  [facility]   PHYSICIAN:  Olga Millers, M.D. LHCDATE OF BIRTH:  01-Mar-1932   DATE OF ADMISSION:  05/14/2006  DATE OF DISCHARGE:  05/15/2006                                 DISCHARGE SUMMARY   PRIMARY CARDIOLOGIST:  Dr. Welton Flakes of [place].   PRINCIPAL DIAGNOSIS:  Atypical chest pain.  A)  Low risk exercise stress  Myoview 06/20.   SECONDARY DIAGNOSES:  1.  Coronary artery disease.  A)  Status post 4-vessel coronary artery      bypass graft 06/06.  2.  Type 2 diabetes mellitus.  3.  Hypertension.  4.  Hyperlipidemia.  5.  History of alcohol abuse.   PROCEDURES:  Exercise stress Myoview 07/20.   REASON FOR ADMISSION:  The patient is a 77 year old male, with known  coronary artery disease status post CABG 06/06, who presented to the  emergency room with chest pain.  His symptoms were felt to be atypical for  ischemia and he was admitted for overnight observation and rule out of  myocardial infarction with further diagnostic evaluation.   HOSPITAL COURSE:  The patient ruled out for myocardial infarction with all  serial cardiac markers within normal limits.  He was thus referred for in  house exercise stress testing.  Subsequent review of perfusion imaging was  difficult secondary to technical limitations of the study attributed to soft  tissue attenuation.  Nevertheless, this was felt to be a low risk study with  no definite evidence of ischemia; calculated ejection fraction 48%.   Given the patient's presentation with atypical chest pain, and with normal  serial cardiac markers, recommendation was for patient to be discharged on  continued medical therapy.  Prilosec will be up titrated secondary to  possible GI etiology for the chest pain.  Of note, recommendation is also  for patient to have followup  laboratory studies (ie. TSH, LFTs, and chest x-  ray) for continued surveillance while on amiodarone.  Of note, Toprol was  down titrated to 25 daily secondary to bradycardia.   LABORATORY DATA:  Normal cardiac markers.  Lipid profile:  Total cholesterol  123, triglycerides 41, HDL 45, and LDL 70.  Potassium 3.6 on admission, 3.3  in followup.  Renal function normal.  Liver enzymes normal.  CBC normal.  Admission chest x-ray:  Probable COPD, no acute changes, multiple old left  rib fracture deformities.   DISCHARGE MEDICATIONS:  1.  Prilosec 20 mg b.i.d.  2.  Toprol XL 25 mg daily.  3.  Aspirin 325 mg daily.  4.  Lovastatin 40 mg daily.  5.  Amiodarone 200 mg daily.   FOLLOWUP:  The patient advised to arrange followup with Dr. Welton Flakes in  [place] in the following 1-2 weeks.   DISPOSITION:  Stable.      Gene Serpe, P.A. [facility]    ______________________________  Olga Millers, M.D. [facility]    GS/MEDQ  D:  05/15/2006  T:  05/15/2006  Job:  [id number]   cc:   Dr. Lynn Ito   Dr. Shanon Rosser

## 2011-03-14 NOTE — H&P (Signed)
NAME:  DECKARD, STUBER NO.:  [id number]   MEDICAL RECORD NO.:  [id number]          PATIENT TYPE:  INP   LOCATION:  3732                         FACILITY:  [facility]   PHYSICIAN:  Rollene Rotunda, M.D.   DATE OF BIRTH:  [birth year]   DATE OF ADMISSION:  05/14/2006  DATE OF DISCHARGE:                                HISTORY & PHYSICAL   PRIMARY CARE PHYSICIAN:  Dr. Shanon Rosser in [place].   CHIEF COMPLAINT:  Chest pain.   HISTORY OF PRESENT ILLNESS:  The patient is a 77 year old white male with a  past medical history notable for coronary artery disease, status post four-  vessel CABG in June of 2006 for obstructed left main, who presents for  evaluation of chest pain.  The patient states that he was in his usual state  of health until earlier this evening.  He developed the onset of pain in his  epigastric region which radiated up towards his heart.  He states that this  pain was similar to that he experienced prior to his open heart surgery;  however, it was not as severe.  He also endorses accompanying nausea and  mild shortness of breath.  The pain is made somewhat worse with eating,  however, is not palpable.  On arrival to the emergency department, his EKG  revealed a slow V response and no injury or ischemic changes.  His initial  biomarkers were negative.  Currently, he is chest-pain-free and otherwise  without complaints.   PAST MEDICAL HISTORY:  1.  Coronary artery disease, status post four-vessel CABG, June 2006.  2.  Hypertension.  3.  Hyperlipidemia.  4.  Diabetes, type 2 (borderline).  5.  History of heavy alcohol abuse.  6.  Chronic atrial fibrillation.  7.  Status post gastric polyp removal ([place]).   ALLERGIES:  CODEINE.   MEDICATIONS:  1.  Toprol-XL 50 mg daily.  2.  Omeprazole 20 mg daily.  3.  Aspirin 325 mg daily.  4.  Amiodarone 200 mg daily.  5.  Lovastatin 40 mg daily.   SOCIAL HISTORY:  The patient lives near [place] alone and is  proficient  in his ADLs.  He denies tobacco abuse.  He states that he has had no alcohol  in the last week; however, when he does drink, he tends to binge and consume  up to 1 case of beer at a time.  He denies any other illicit substances.   FAMILY HISTORY:  Noncontributory.   REVIEW OF SYSTEMS:  The patient denies any fevers, chills, sweats or  adenopathy.  HEENT:  No headache, no sore throat, no nasal discharge, no  change in visual or auditory acuity.  SKIN:  No rashes or lesions.  CARDIOPULMONARY:  As per HPI.  The patient denies any orthopnea, PND,  palpitations, presyncope or syncope.  GU:  No frequency, urgency or dysuria.  NEUROPSYCHIATRIC:  No weakness, numbness or mood disturbance.  MUSCULOSKELETAL:  No myalgias or arthralgias or joint swelling.  GI:  No  diarrhea, bright red blood per rectum, melena, dysphagia or change in bowel  habits.  ENDOCRINE:  No polyuria, no polydipsia, no heat or cold  intolerance.  All other review of systems are negative.   PHYSICAL EXAMINATION:  VITAL SIGNS:  Blood pressure 115/69.  Heart rate is  48.  O2 saturation is 97% on room air.  Temperature is 96.8.  GENERAL:  The patient is alert and oriented x3,  in no acute distress,  pleasant and conversant.  HEENT:  Normocephalic, atraumatic.  EOMI.  PERRL.  Nares patent.  OP is  clear without erythema or exudate.  NECK:  Supple, full range of motion, no JVD, carotid upstrokes are equal and  symmetric bilaterally without audible bruit.  LYMPHADENOPATHY:  None.  CARDIOVASCULAR:  Bradycardic, S1 and S2, which is irregularly irregular.  No  audible murmurs, rubs, or gallops.  LUNGS:  Clear to auscultation bilaterally.  SKIN:  No rashes or lesions.  ABDOMEN:  Soft, nontender and non-distended.  Positive bowel sounds.  No  hepatosplenomegaly.  GU:  Normal male genitalia.  EXTREMITIES:  No clubbing, cyanosis, or edema.  MUSCULOSKELETAL:  No joint deformity or effusions.  NEUROLOGIC:  Cranial nerves  II-XII grossly intact.  Strength and sensation  are grossly intact throughout.   ACCESSORY CLINICAL DATA:  Chest x-ray:  No acute cardiopulmonary process.   EKG:  Atrial fibrillation with slow V response (48 beats per minute), no  injury or acute ischemia.   LABORATORY DATA:  Sodium 142, potassium 3.6, chloride 108, BUN 17,  creatinine 1.2, glucose 158.  CK-MB is less than 1, troponin less than 0.05.  Hematocrit is 42.   IMPRESSION:  [age]-year-old white male with known coronary artery  disease, status post coronary artery bypass graft in January 2006,  presenting with atypical chest pain.   Admit to Telemetry, cycle cardiac biomarkers, rule out myocardial  infarction.  The nature of his symptoms are somewhat atypical; however, he  experienced atypical symptoms prior to his coronary artery bypass graftting  1 year ago.  If the patient rules out for myocardial infarction, would  consider noninvasive stress testing.  With regards to his atrial  fibrillation, he is maintained chronically on amiodarone 200 mg daily and  aspirin 325 mg daily.  In addition, he takes Toprol-XL 50 mg daily and I  will hold this medication as he currently is V-response in the 40s.  Otherwise, we will continue with his home medication regimen and initiate an  alcohol withdrawal protocol.     Audery Amel, MD  Electronically Signed     ______________________________  Rollene Rotunda, M.D.   Maylon Cos  D:  05/15/2006  T:  05/15/2006  Job:  [id number]

## 2011-03-31 ENCOUNTER — Ambulatory Visit: Payer: Medicare Other | Admitting: Family Medicine

## 2011-03-31 ENCOUNTER — Observation Stay (HOSPITAL_COMMUNITY)
Admission: EM | Admit: 2011-03-31 | Discharge: 2011-04-04 | Disposition: A | Discharge status: Home / Self Care | Payer: Medicare Other | Source: Home / Self Care | Attending: Emergency Medicine | Admitting: Emergency Medicine

## 2011-03-31 DIAGNOSIS — A0472 Enterocolitis due to Clostridium difficile, not specified as recurrent: Secondary | ICD-10-CM | POA: Insufficient documentation

## 2011-03-31 DIAGNOSIS — N400 Enlarged prostate without lower urinary tract symptoms: Secondary | ICD-10-CM | POA: Insufficient documentation

## 2011-03-31 DIAGNOSIS — R9431 Abnormal electrocardiogram [ECG] [EKG]: Secondary | ICD-10-CM | POA: Insufficient documentation

## 2011-03-31 DIAGNOSIS — I10 Essential (primary) hypertension: Secondary | ICD-10-CM | POA: Insufficient documentation

## 2011-03-31 DIAGNOSIS — F0390 Unspecified dementia without behavioral disturbance: Secondary | ICD-10-CM | POA: Insufficient documentation

## 2011-03-31 DIAGNOSIS — S5010XA Contusion of unspecified forearm, initial encounter: Secondary | ICD-10-CM | POA: Insufficient documentation

## 2011-03-31 DIAGNOSIS — F430 Acute stress reaction: Secondary | ICD-10-CM | POA: Insufficient documentation

## 2011-03-31 DIAGNOSIS — Z79899 Other long term (current) drug therapy: Secondary | ICD-10-CM | POA: Insufficient documentation

## 2011-03-31 DIAGNOSIS — F29 Unspecified psychosis not due to a substance or known physiological condition: Secondary | ICD-10-CM | POA: Insufficient documentation

## 2011-03-31 DIAGNOSIS — E119 Type 2 diabetes mellitus without complications: Secondary | ICD-10-CM | POA: Insufficient documentation

## 2011-03-31 DIAGNOSIS — S60229A Contusion of unspecified hand, initial encounter: Secondary | ICD-10-CM | POA: Insufficient documentation

## 2011-03-31 DIAGNOSIS — S60219A Contusion of unspecified wrist, initial encounter: Secondary | ICD-10-CM | POA: Insufficient documentation

## 2011-03-31 DIAGNOSIS — IMO0002 Reserved for concepts with insufficient information to code with codable children: Secondary | ICD-10-CM | POA: Insufficient documentation

## 2011-03-31 DIAGNOSIS — I2510 Atherosclerotic heart disease of native coronary artery without angina pectoris: Secondary | ICD-10-CM | POA: Insufficient documentation

## 2011-03-31 DIAGNOSIS — X58XXXA Exposure to other specified factors, initial encounter: Secondary | ICD-10-CM | POA: Insufficient documentation

## 2011-03-31 DIAGNOSIS — M79609 Pain in unspecified limb: Secondary | ICD-10-CM | POA: Insufficient documentation

## 2011-03-31 DIAGNOSIS — Z951 Presence of aortocoronary bypass graft: Secondary | ICD-10-CM | POA: Insufficient documentation

## 2011-03-31 LAB — BASIC METABOLIC PANEL
BUN: 17 mg/dL (ref 6–23)
CO2: 26 mEq/L (ref 19–32)
Chloride: 100 mEq/L (ref 96–112)
Glucose, Bld: 196 mg/dL — ABNORMAL HIGH (ref 70–99)
Potassium: 3.1 mEq/L — ABNORMAL LOW (ref 3.5–5.1)
Sodium: 139 mEq/L (ref 135–145)

## 2011-03-31 LAB — CBC
HCT: 44.9 % (ref 39.0–52.0)
Hemoglobin: 15.3 g/dL (ref 13.0–17.0)
MCHC: 34.1 g/dL (ref 30.0–36.0)
MCV: 86.5 fL (ref 78.0–100.0)
RDW: 14.4 % (ref 11.5–15.5)

## 2011-03-31 LAB — URINALYSIS, ROUTINE W REFLEX MICROSCOPIC
Glucose, UA: 100 mg/dL — AB
Hgb urine dipstick: NEGATIVE
Protein, ur: NEGATIVE mg/dL
Urobilinogen, UA: 1 mg/dL (ref 0.0–1.0)

## 2011-03-31 LAB — RAPID URINE DRUG SCREEN, HOSP PERFORMED
Amphetamines: NOT DETECTED
Benzodiazepines: NOT DETECTED
Opiates: NOT DETECTED
Tetrahydrocannabinol: NOT DETECTED

## 2011-04-03 ENCOUNTER — Ambulatory Visit: Payer: Medicare Other

## 2011-04-03 LAB — URINALYSIS, ROUTINE W REFLEX MICROSCOPIC
Bilirubin Urine: NEGATIVE
Hgb urine dipstick: NEGATIVE
Ketones, ur: NEGATIVE mg/dL
Nitrite: NEGATIVE
Protein, ur: NEGATIVE mg/dL
Specific Gravity, Urine: 1.023 (ref 1.005–1.030)
Urobilinogen, UA: 0.2 mg/dL (ref 0.0–1.0)

## 2011-04-03 LAB — GLUCOSE, CAPILLARY
Glucose-Capillary: 95 mg/dL (ref 70–99)
Glucose-Capillary: 99 mg/dL (ref 70–99)

## 2011-04-03 LAB — DIFFERENTIAL
Basophils Absolute: 0 10*3/uL (ref 0.0–0.1)
Basophils Relative: 1 % (ref 0–1)
Eosinophils Relative: 2 % (ref 0–5)
Lymphocytes Relative: 30 % (ref 12–46)
Monocytes Absolute: 0.8 10*3/uL (ref 0.1–1.0)
Neutro Abs: 4.6 10*3/uL (ref 1.7–7.7)

## 2011-04-03 LAB — POCT I-STAT, CHEM 8
Chloride: 106 mEq/L (ref 96–112)
Glucose, Bld: 100 mg/dL — ABNORMAL HIGH (ref 70–99)
HCT: 44 % (ref 39.0–52.0)
Hemoglobin: 15 g/dL (ref 13.0–17.0)
Potassium: 3.7 mEq/L (ref 3.5–5.1)
Sodium: 142 mEq/L (ref 135–145)

## 2011-04-03 LAB — CK TOTAL AND CKMB (NOT AT ARMC)
CK, MB: 2.5 ng/mL (ref 0.3–4.0)
Relative Index: INVALID (ref 0.0–2.5)
Total CK: 72 U/L (ref 7–232)

## 2011-04-03 LAB — CBC
HCT: 41.9 % (ref 39.0–52.0)
MCHC: 34.1 g/dL (ref 30.0–36.0)
Platelets: 155 10*3/uL (ref 150–400)
RDW: 14.2 % (ref 11.5–15.5)
WBC: 8 10*3/uL (ref 4.0–10.5)

## 2011-04-03 LAB — HEPATIC FUNCTION PANEL
Albumin: 3.7 g/dL (ref 3.5–5.2)
Alkaline Phosphatase: 73 U/L (ref 39–117)
Bilirubin, Direct: 0.2 mg/dL (ref 0.0–0.3)
Total Bilirubin: 1 mg/dL (ref 0.3–1.2)

## 2011-04-03 LAB — TROPONIN I: Troponin I: <0.3 ng/mL (ref <0.30)

## 2011-04-04 LAB — RPR: RPR Ser Ql: NONREACTIVE

## 2011-04-05 ENCOUNTER — Inpatient Hospital Stay (HOSPITAL_COMMUNITY)
Admission: EM | Admit: 2011-04-05 | Discharge: 2011-04-09 | DRG: 372 | Disposition: A | Discharge status: Other Acute Inpatient Hospital | Payer: Medicare Other | Attending: Internal Medicine | Admitting: Internal Medicine

## 2011-04-05 DIAGNOSIS — I10 Essential (primary) hypertension: Secondary | ICD-10-CM | POA: Diagnosis present

## 2011-04-05 DIAGNOSIS — N400 Enlarged prostate without lower urinary tract symptoms: Secondary | ICD-10-CM | POA: Diagnosis present

## 2011-04-05 DIAGNOSIS — E119 Type 2 diabetes mellitus without complications: Secondary | ICD-10-CM | POA: Diagnosis present

## 2011-04-05 DIAGNOSIS — I2510 Atherosclerotic heart disease of native coronary artery without angina pectoris: Secondary | ICD-10-CM | POA: Diagnosis present

## 2011-04-05 DIAGNOSIS — E785 Hyperlipidemia, unspecified: Secondary | ICD-10-CM | POA: Diagnosis present

## 2011-04-05 DIAGNOSIS — F068 Other specified mental disorders due to known physiological condition: Secondary | ICD-10-CM | POA: Diagnosis present

## 2011-04-05 DIAGNOSIS — N390 Urinary tract infection, site not specified: Secondary | ICD-10-CM | POA: Diagnosis present

## 2011-04-05 DIAGNOSIS — I672 Cerebral atherosclerosis: Secondary | ICD-10-CM | POA: Diagnosis present

## 2011-04-05 DIAGNOSIS — K861 Other chronic pancreatitis: Secondary | ICD-10-CM | POA: Diagnosis present

## 2011-04-05 DIAGNOSIS — F29 Unspecified psychosis not due to a substance or known physiological condition: Secondary | ICD-10-CM | POA: Diagnosis present

## 2011-04-05 DIAGNOSIS — F0150 Vascular dementia without behavioral disturbance: Secondary | ICD-10-CM | POA: Diagnosis present

## 2011-04-05 DIAGNOSIS — Z7982 Long term (current) use of aspirin: Secondary | ICD-10-CM

## 2011-04-05 DIAGNOSIS — Z951 Presence of aortocoronary bypass graft: Secondary | ICD-10-CM

## 2011-04-05 DIAGNOSIS — A0472 Enterocolitis due to Clostridium difficile, not specified as recurrent: Principal | ICD-10-CM | POA: Diagnosis present

## 2011-04-05 LAB — DIFFERENTIAL
Basophils Absolute: 0 10*3/uL (ref 0.0–0.1)
Basophils Relative: 0 % (ref 0–1)
Neutro Abs: 5.6 10*3/uL (ref 1.7–7.7)
Neutrophils Relative %: 57 % (ref 43–77)

## 2011-04-05 LAB — CBC
Hemoglobin: 14.9 g/dL (ref 13.0–17.0)
RBC: 4.95 MIL/uL (ref 4.22–5.81)

## 2011-04-05 LAB — POCT I-STAT, CHEM 8
BUN: 16 mg/dL (ref 6–23)
Hemoglobin: 15.6 g/dL (ref 13.0–17.0)
Sodium: 144 mEq/L (ref 135–145)
TCO2: 25 mmol/L (ref 0–100)

## 2011-04-06 DIAGNOSIS — F0391 Unspecified dementia with behavioral disturbance: Secondary | ICD-10-CM

## 2011-04-06 LAB — CLOSTRIDIUM DIFFICILE BY PCR: Toxigenic C. Difficile by PCR: POSITIVE — AB

## 2011-04-07 ENCOUNTER — Ambulatory Visit: Payer: Medicare Other | Admitting: Family Medicine

## 2011-04-07 DIAGNOSIS — Z0289 Encounter for other administrative examinations: Secondary | ICD-10-CM

## 2011-04-07 LAB — BASIC METABOLIC PANEL
BUN: 10 mg/dL (ref 6–23)
Chloride: 110 mEq/L (ref 96–112)
GFR calc Af Amer: >60 mL/min (ref >60)
Glucose, Bld: 78 mg/dL (ref 70–99)
Potassium: 4 mEq/L (ref 3.5–5.1)

## 2011-04-07 LAB — CBC
HCT: 38.6 % — ABNORMAL LOW (ref 39.0–52.0)
Hemoglobin: 13 g/dL (ref 13.0–17.0)
RDW: 14.6 % (ref 11.5–15.5)
WBC: 6.1 10*3/uL (ref 4.0–10.5)
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
## 2011-04-09 LAB — CBC
Hemoglobin: 13.4 g/dL (ref 13.0–17.0)
MCH: 29.8 pg (ref 26.0–34.0)
RBC: 4.5 MIL/uL (ref 4.22–5.81)

## 2011-04-09 LAB — BASIC METABOLIC PANEL
CO2: 26 mEq/L (ref 19–32)
Calcium: 8.7 mg/dL (ref 8.4–10.5)
Chloride: 108 mEq/L (ref 96–112)
Glucose, Bld: 83 mg/dL (ref 70–99)
Sodium: 142 mEq/L (ref 135–145)
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
## 2011-04-13 ENCOUNTER — Encounter: Payer: Self-pay | Admitting: Family Medicine

## 2011-04-13 DIAGNOSIS — F0390 Unspecified dementia without behavioral disturbance: Secondary | ICD-10-CM | POA: Insufficient documentation
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
## 2011-04-13 NOTE — Assessment & Plan Note (Signed)
This is actual correct diagnosis for blood work ordered on 01/27/2011.  294.2, not 290.0.
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
## 2011-04-25 ENCOUNTER — Emergency Department: Payer: Medicare Other | Admitting: Emergency Medicine

## 2011-05-01 ENCOUNTER — Ambulatory Visit: Payer: Medicare Other

## 2011-05-04 ENCOUNTER — Emergency Department: Payer: Medicare Other | Admitting: Emergency Medicine

## 2011-05-23 ENCOUNTER — Other Ambulatory Visit: Payer: Self-pay | Admitting: Family Medicine

## 2011-05-29 ENCOUNTER — Ambulatory Visit: Payer: Medicare Other

## 2011-07-03 ENCOUNTER — Ambulatory Visit: Payer: Medicare Other

## 2011-07-31 ENCOUNTER — Ambulatory Visit: Payer: Medicare Other

## 2011-08-01 LAB — COMPREHENSIVE METABOLIC PANEL
Albumin: 3.5
Alkaline Phosphatase: 69
BUN: 11
Chloride: 109
Creatinine, Ser: 1.05
Glucose, Bld: 80
Total Bilirubin: 1.3 — ABNORMAL HIGH
Total Protein: 6.1

## 2011-08-01 LAB — HEPATIC FUNCTION PANEL
ALT: 17
AST: 21
Albumin: 3.7
Alkaline Phosphatase: 78
Total Bilirubin: 1.4 — ABNORMAL HIGH
Total Protein: 6.2

## 2011-08-01 LAB — CARDIAC PANEL(CRET KIN+CKTOT+MB+TROPI)
CK, MB: 1.6
Total CK: 45
Total CK: 47
Troponin I: 0.04

## 2011-08-01 LAB — I-STAT 8, (EC8 V) (CONVERTED LAB)
BUN: 16
Chloride: 109
HCT: 47
Hemoglobin: 16
Operator id: 288831
Potassium: 4.2
Sodium: 141
pCO2, Ven: 54.5 — ABNORMAL HIGH

## 2011-08-01 LAB — BASIC METABOLIC PANEL
BUN: 9
CO2: 30
Chloride: 104
GFR calc non Af Amer: >60
Glucose, Bld: 93
Potassium: 4.1

## 2011-08-01 LAB — FOLATE: Folate: >20

## 2011-08-01 LAB — LIPID PANEL
HDL: 39 — ABNORMAL LOW
LDL Cholesterol: 123 — ABNORMAL HIGH
Total CHOL/HDL Ratio: 4.7
VLDL: 20

## 2011-08-01 LAB — POCT I-STAT CREATININE
Creatinine, Ser: 1.3
Operator id: 288831

## 2011-08-01 LAB — VITAMIN B12: Vitamin B-12: 235 (ref 211–911)

## 2011-08-01 LAB — T4, FREE: Free T4: 0.97

## 2011-08-01 LAB — HEMOGLOBIN A1C: Hgb A1c MFr Bld: 6

## 2011-08-01 LAB — TROPONIN I: Troponin I: 0.02

## 2011-08-04 ENCOUNTER — Ambulatory Visit: Payer: Medicare Other | Admitting: Family Medicine

## 2011-12-22 ENCOUNTER — Emergency Department: Payer: Self-pay | Admitting: Emergency Medicine

## 2012-03-07 ENCOUNTER — Inpatient Hospital Stay: Payer: Self-pay | Admitting: Specialist

## 2012-03-07 LAB — CBC
HCT: 43.5 % (ref 40.0–52.0)
MCH: 30 pg (ref 26.0–34.0)
MCHC: 32.8 g/dL (ref 32.0–36.0)
MCV: 92 fL (ref 80–100)
Platelet: 131 10*3/uL — ABNORMAL LOW (ref 150–440)
RBC: 4.76 10*6/uL (ref 4.40–5.90)
WBC: 17.4 10*3/uL — ABNORMAL HIGH (ref 3.8–10.6)

## 2012-03-07 LAB — COMPREHENSIVE METABOLIC PANEL
Albumin: 3.9 g/dL (ref 3.4–5.0)
Alkaline Phosphatase: 86 U/L (ref 50–136)
BUN: 18 mg/dL (ref 7–18)
Bilirubin,Total: 1.2 mg/dL — ABNORMAL HIGH (ref 0.2–1.0)
Co2: 31 mmol/L (ref 21–32)
EGFR (African American): >60
EGFR (Non-African Amer.): >60
Glucose: 102 mg/dL — ABNORMAL HIGH (ref 65–99)
Potassium: 4.1 mmol/L (ref 3.5–5.1)
SGOT(AST): 18 U/L (ref 15–37)
SGPT (ALT): 17 U/L
Total Protein: 7.5 g/dL (ref 6.4–8.2)

## 2012-03-07 LAB — CK TOTAL AND CKMB (NOT AT ARMC)
CK, Total: 60 U/L (ref 35–232)
CK-MB: 1.3 ng/mL (ref 0.5–3.6)

## 2012-03-07 LAB — URINALYSIS, COMPLETE
Bilirubin,UR: NEGATIVE
Ketone: NEGATIVE
Protein: NEGATIVE
Specific Gravity: 1.017 (ref 1.003–1.030)
Squamous Epithelial: NONE SEEN
WBC UR: 19 /HPF (ref 0–5)

## 2012-03-07 LAB — TROPONIN I: Troponin-I: <0.02 ng/mL

## 2012-03-08 LAB — MAGNESIUM: Magnesium: 2 mg/dL

## 2012-03-08 LAB — CBC WITH DIFFERENTIAL/PLATELET
Basophil #: 0 10*3/uL (ref 0.0–0.1)
Eosinophil #: 0 10*3/uL (ref 0.0–0.7)
Lymphocyte #: 1.7 10*3/uL (ref 1.0–3.6)
MCH: 30.3 pg (ref 26.0–34.0)
MCHC: 33.5 g/dL (ref 32.0–36.0)
Monocyte %: 10.3 %
Neutrophil #: 12.8 10*3/uL — ABNORMAL HIGH (ref 1.4–6.5)
Neutrophil %: 78.7 %
RDW: 13.5 % (ref 11.5–14.5)
WBC: 16.3 10*3/uL — ABNORMAL HIGH (ref 3.8–10.6)

## 2012-03-08 LAB — CK TOTAL AND CKMB (NOT AT ARMC)
CK, Total: 56 U/L (ref 35–232)
CK, Total: 56 U/L (ref 35–232)
CK-MB: 0.9 ng/mL (ref 0.5–3.6)

## 2012-03-08 LAB — LIPID PANEL
Ldl Cholesterol, Calc: 37 mg/dL (ref 0–100)
Triglycerides: 47 mg/dL (ref 0–200)

## 2012-03-08 LAB — PROTIME-INR
INR: 1.1
Prothrombin Time: 14.9 secs — ABNORMAL HIGH (ref 11.5–14.7)

## 2012-03-08 LAB — TSH: Thyroid Stimulating Horm: 1.24 u[IU]/mL

## 2012-03-10 LAB — URINE CULTURE

## 2012-03-10 LAB — CULTURE, BLOOD (SINGLE)

## 2012-03-13 LAB — CULTURE, BLOOD (SINGLE)

## 2012-08-31 IMAGING — CT CT ABD-PELV W/ CM
1 of 2 series · 15 of 32 positions shown, 19 images · non-contrast
Comparison: none

REASON FOR EXAM: (1) diarrhea , abd pain; (2) same
COMMENTS:   LMP: (Male)

PROCEDURE:     CT  - CT ABDOMEN / PELVIS  W  - September 24, 2010  [DATE]
RESULT:     CT abdomen and pelvis dated 09/24/2010 comparison made to prior
study dated 08/13/2007.
TECHNIQUE: Helical 3 mm sections were obtained from the lung bases through
the pubic symphysis status post intravenous administration of 100 mL of
Psovue-WCC and oral contrast. Findings: Evaluation of the lung bases
demonstrate mild interstitial changes.

[Series 2: 3mm soft tissue · axial · 0.82mm/px · z∈[-550,-120]mm · 15 of 155 slices shown, 19 images]
[im 6/155  soft-tissue]
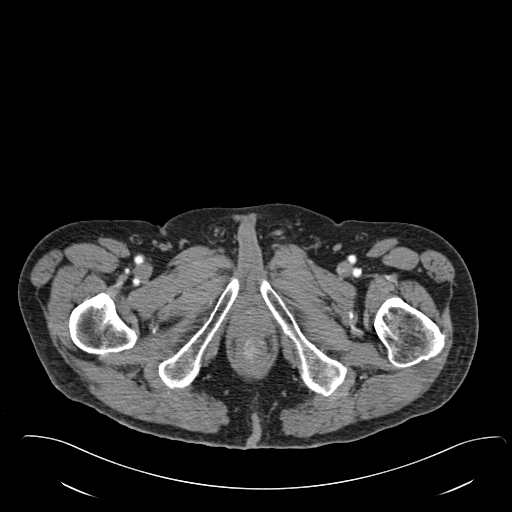
[im 6/155  bone]
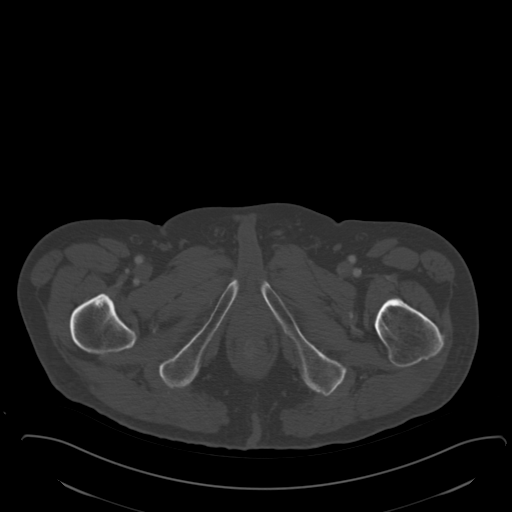
[im 18/155  soft-tissue]
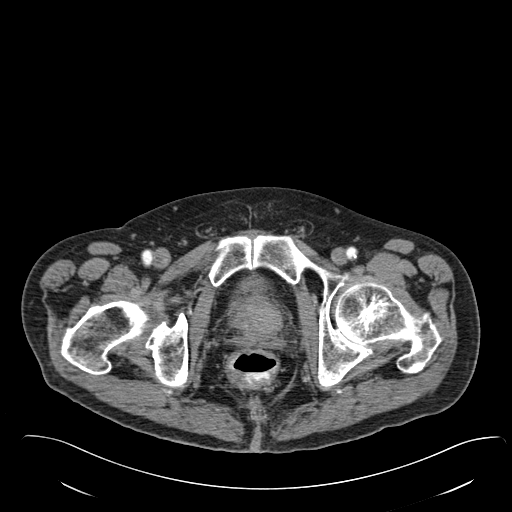
[im 30/155  soft-tissue]
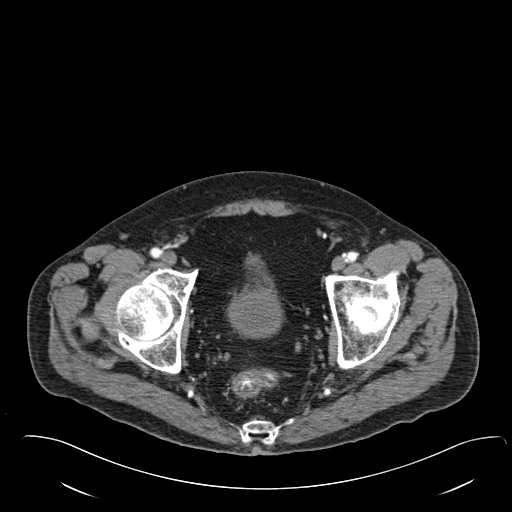
[im 42/155  soft-tissue]
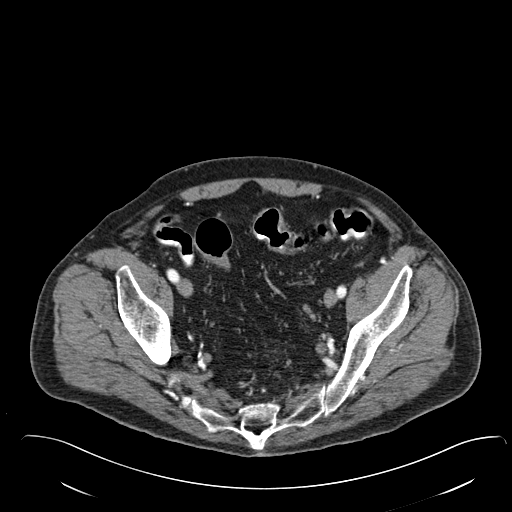
[im 54/155  soft-tissue]
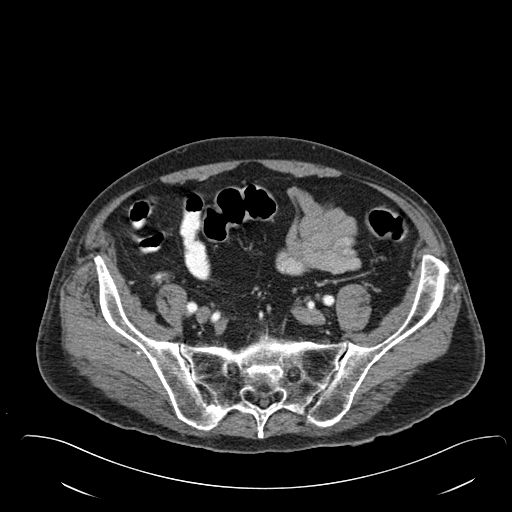
[im 66/155  soft-tissue]
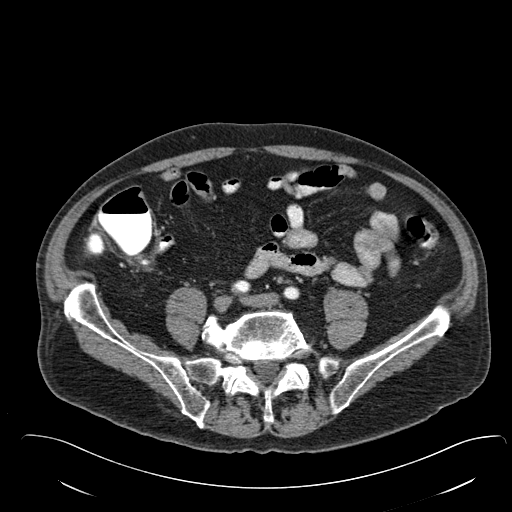
[im 78/155  soft-tissue]
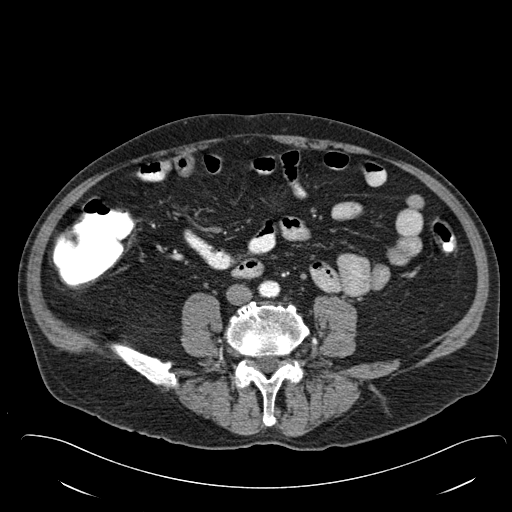
[im 89/155  soft-tissue]
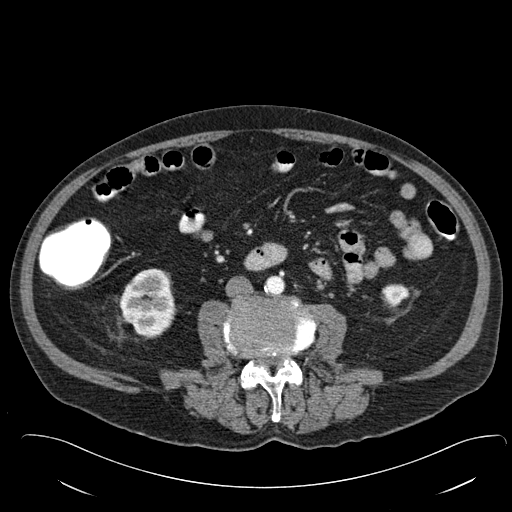
[im 101/155  soft-tissue]
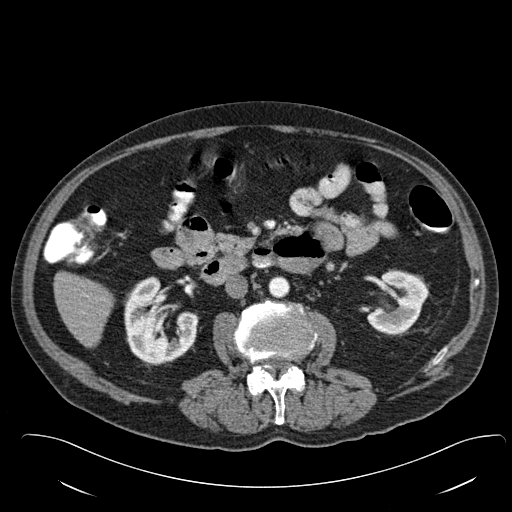
[im 101/155  bone]
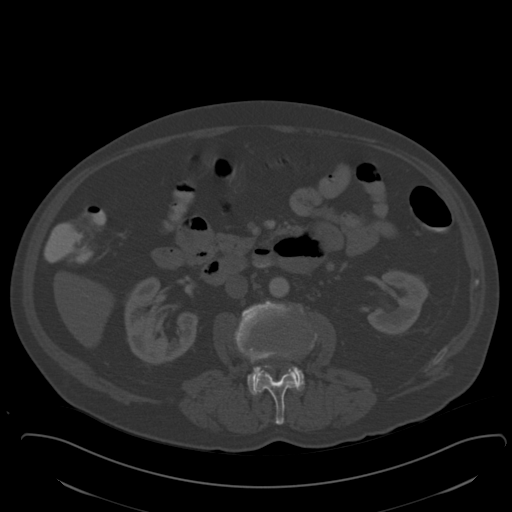
[im 113/155  soft-tissue]
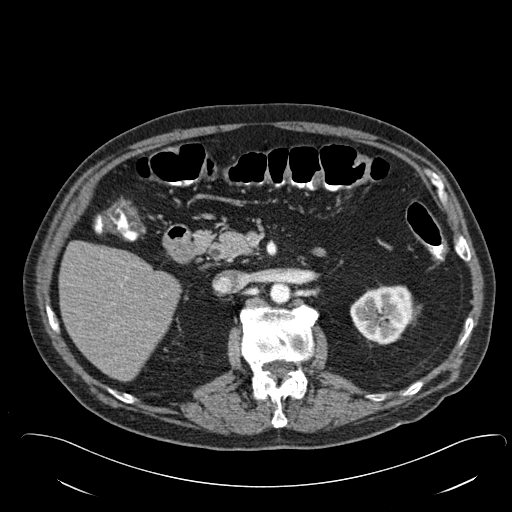
[im 125/155  soft-tissue]
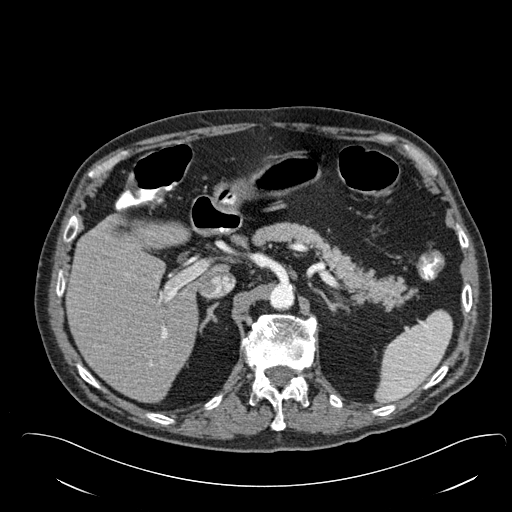
[im 131/155  lung]
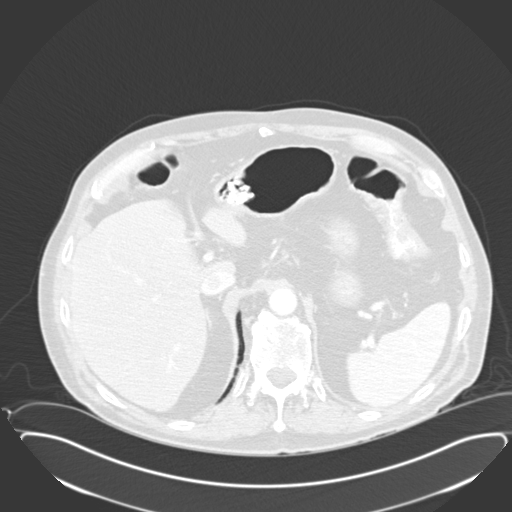
[im 137/155  soft-tissue]
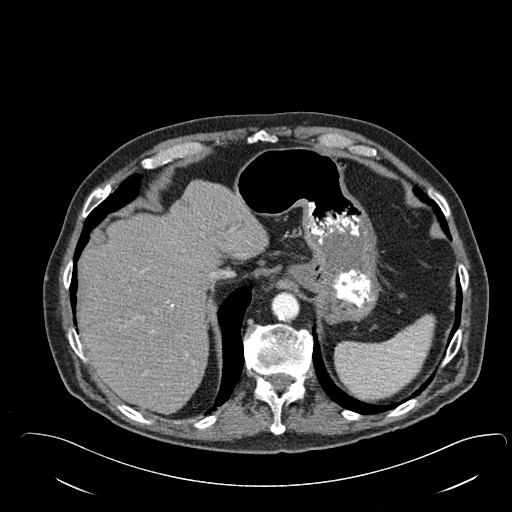
[im 137/155  lung]
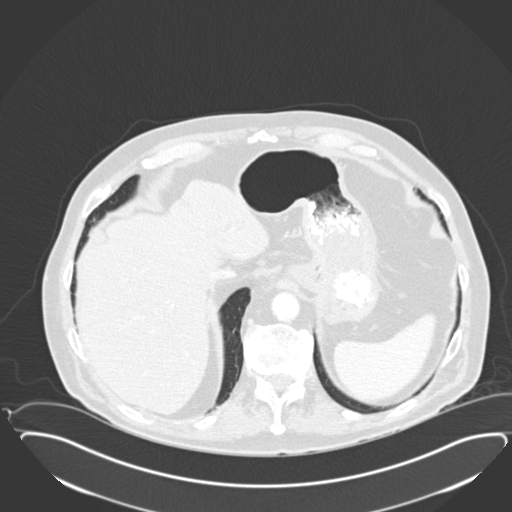
[im 143/155  lung]
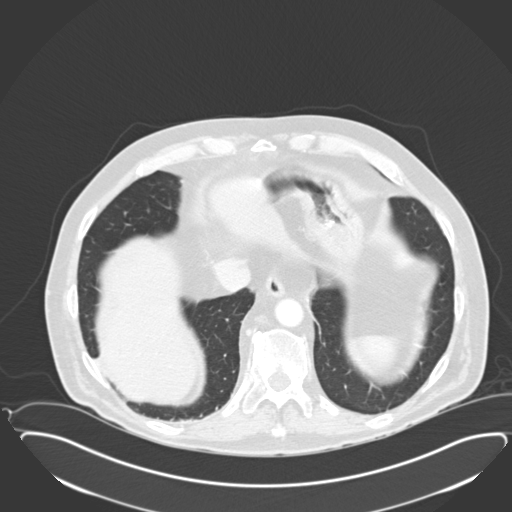
[im 149/155  soft-tissue]
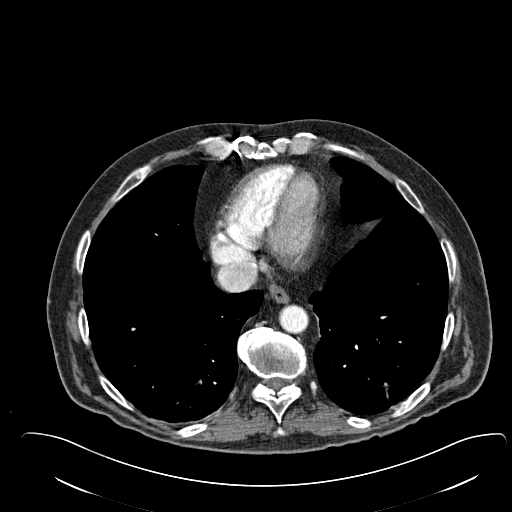
[im 149/155  lung]
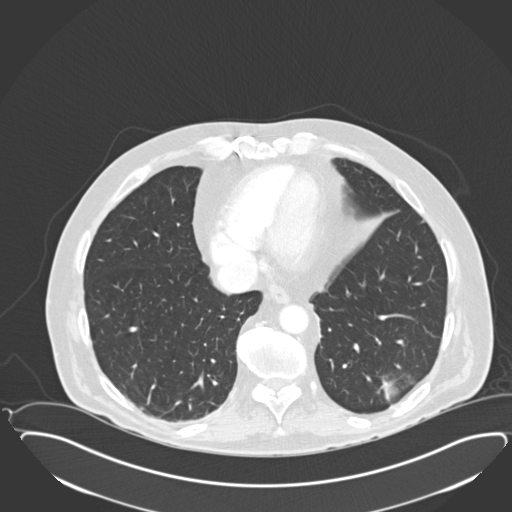

[15 of 32 positions shown; findings below may reference images not displayed]

The liver, spleen, adrenals, pancreas, kidneys are unremarkable. There is no
evidence of abdominal or pelvic free fluid, loculated fluid collections,
mass is nor adenopathy.

There is no evidence of [place] or pelvic free fluid, loculated fluid
collections, masses or adenopathy. There is no CT evidence reflecting
enteritis, colitis, diverticulitis nor appendicitis. Is no evidence of
abdominal aortic aneurysm.
IMPRESSION: No CT evidence of of obstructive or inflammatory
abnormality.

## 2013-03-31 ENCOUNTER — Other Ambulatory Visit: Payer: Self-pay | Admitting: *Deleted

## 2013-03-31 ENCOUNTER — Encounter: Payer: Self-pay | Admitting: Nurse Practitioner

## 2013-03-31 MED ORDER — HYDROCODONE-ACETAMINOPHEN 5-325 MG PO TABS: ORAL_TABLET | ORAL | Status: DC

## 2013-04-05 ENCOUNTER — Encounter: Payer: Self-pay | Admitting: Nurse Practitioner

## 2013-04-05 NOTE — Progress Notes (Signed)
This encounter was created in error - please disregard.

## 2013-04-28 ENCOUNTER — Other Ambulatory Visit: Payer: Self-pay | Admitting: Geriatric Medicine

## 2013-04-28 MED ORDER — HYDROCODONE-ACETAMINOPHEN 5-325 MG PO TABS: ORAL_TABLET | ORAL | Status: DC

## 2013-05-03 ENCOUNTER — Other Ambulatory Visit: Payer: Self-pay | Admitting: Geriatric Medicine

## 2013-05-03 MED ORDER — HYDROCODONE-ACETAMINOPHEN 5-325 MG PO TABS: ORAL_TABLET | ORAL | Status: DC

## 2013-05-26 ENCOUNTER — Non-Acute Institutional Stay (SKILLED_NURSING_FACILITY): Payer: Medicare Other | Admitting: Adult Health

## 2013-05-26 DIAGNOSIS — K259 Gastric ulcer, unspecified as acute or chronic, without hemorrhage or perforation: Secondary | ICD-10-CM

## 2013-05-26 DIAGNOSIS — K5900 Constipation, unspecified: Secondary | ICD-10-CM

## 2013-05-26 DIAGNOSIS — F329 Major depressive disorder, single episode, unspecified: Secondary | ICD-10-CM

## 2013-05-26 DIAGNOSIS — E785 Hyperlipidemia, unspecified: Secondary | ICD-10-CM

## 2013-05-26 DIAGNOSIS — F3289 Other specified depressive episodes: Secondary | ICD-10-CM

## 2013-05-26 DIAGNOSIS — J3489 Other specified disorders of nose and nasal sinuses: Secondary | ICD-10-CM

## 2013-05-26 DIAGNOSIS — N400 Enlarged prostate without lower urinary tract symptoms: Secondary | ICD-10-CM

## 2013-05-26 DIAGNOSIS — F0390 Unspecified dementia without behavioral disturbance: Secondary | ICD-10-CM

## 2013-05-26 DIAGNOSIS — M545 Low back pain, unspecified: Secondary | ICD-10-CM

## 2013-05-26 DIAGNOSIS — G4700 Insomnia, unspecified: Secondary | ICD-10-CM

## 2013-05-26 DIAGNOSIS — G459 Transient cerebral ischemic attack, unspecified: Secondary | ICD-10-CM

## 2013-06-06 ENCOUNTER — Encounter: Payer: Self-pay | Admitting: Adult Health

## 2013-06-06 DIAGNOSIS — K5900 Constipation, unspecified: Secondary | ICD-10-CM | POA: Insufficient documentation

## 2013-06-06 DIAGNOSIS — N400 Enlarged prostate without lower urinary tract symptoms: Secondary | ICD-10-CM | POA: Insufficient documentation

## 2013-06-06 DIAGNOSIS — G4700 Insomnia, unspecified: Secondary | ICD-10-CM | POA: Insufficient documentation

## 2013-06-06 NOTE — Assessment & Plan Note (Signed)
Is presently stable will continue nexium 40 mg daily

## 2013-06-06 NOTE — Assessment & Plan Note (Signed)
Will continue flomax daily and will monitor

## 2013-06-06 NOTE — Assessment & Plan Note (Signed)
Will continue lipitor 10 mg daily and will monitor

## 2013-06-06 NOTE — Assessment & Plan Note (Signed)
Pain is being managed vicodin 5/325 mg twice daily

## 2013-06-06 NOTE — Progress Notes (Signed)
Patient ID: [name], male   DOB: 04/19/1932, 79 y.o.   MRN: [id number]  [facility]  Allergies  Allergen Reactions  . Codeine     REACTION: Rash,itching     Chief Complaint  Patient presents with  . Medical Managment of Chronic Issues    HPI:  he is being seen for the management of his chronic illnesses. There are no concerns being voiced by the nursing staff at this time. Overall his status has remained unchanged.   Past Medical History  Diagnosis Date  . Anxiety state, unspecified   . Arthritis     fingers/shoulders  . Unspecified cataract   . Colon polyps     last colonoscopy 2010  . CAD (coronary artery disease) 2006    s/p CABG  . Senile dementia, uncomplicated   . Depression   . HLD (hyperlipidemia)   . Gastric ulcer, unspecified as acute or chronic, without mention of hemorrhage, perforation, or obstruction   . Hypertension   . Insomnia   . TIA (transient ischemic attack)   . Clostridium difficile infection 2012    recurrent-last treatment 10/2010  . BPH (benign prostatic hypertrophy)   . H/O: hypothyroidism   . History of chronic pancreatitis   . Alcohol abuse, in remission     sober x years  . Paroxysmal a-fib     history of  . Hx: UTI (urinary tract infection)   . Diabetes mellitus type II     history of  . ARF (acute renal failure)     history of  . History of chicken pox   . Pulmonary nodule/lesion, solitary 05/2010    4mm L lung base nodule, if high risk rec f/u with CT 1 yr, if low risk, no f/u needed    Past Surgical History  Procedure Laterality Date  . Coronary artery bypass graft  2006  . Injection knee  09/06/2001  . Cardiovascular stress test  05/2008    small regions of perfusion abnormality suggestive of diaphragm artifact, exercise cap 7 METs    VITAL SIGNS BP 117/73  Pulse 80  Ht 6\' 1"  (1.854 m)  Wt 131 lb 12.8 oz (59.784 kg)  BMI 17.39 kg/m2   Patient's Medications  New Prescriptions   No medications on file   Previous Medications   ASPIRIN 81 MG EC TABLET    Take 81 mg by mouth daily.     ATORVASTATIN (LIPITOR) 10 MG TABLET    Take 10 mg by mouth daily.   CHOLECALCIFEROL (VITAMIN D) 1000 UNITS TABLET    Take 400 Units by mouth daily.    ESOMEPRAZOLE (NEXIUM) 40 MG CAPSULE    Take 40 mg by mouth daily before breakfast.   HYDROCODONE-ACETAMINOPHEN (NORCO/VICODIN) 5-325 MG PER TABLET    Take 1 tablet by mouth 2 (two) times daily.   LORATADINE (CLARITIN) 10 MG TABLET    Take 10 mg by mouth daily.   MELATONIN 3 MG TABS    Take 3 mg by mouth at bedtime.   MEMANTINE HCL ER (NAMENDA XR) 28 MG CP24    Take 28 mg by mouth daily.   MULTIPLE VITAMIN (MULTIVITAMIN) TABLET    Take 1 tablet by mouth daily.     RIVASTIGMINE (EXELON) 13.3 MG/24HR PT24    Place 13.3 mg onto the skin daily.   SENNOSIDES-DOCUSATE SODIUM (SENOKOT-S) 8.6-50 MG TABLET    Take 2 tablets by mouth daily.   SERTRALINE (ZOLOFT) 50 MG TABLET    Take 100  mg by mouth daily.    TAMSULOSIN HCL (FLOMAX) 0.4 MG CAPS    Take 0.4 mg by mouth daily.    Modified Medications   No medications on file  Discontinued Medications   CYANOCOBALAMIN (,VITAMIN B-12,) 1000 MCG/ML INJECTION    Inject 1 mL (1,000 mcg total) into the muscle every 30 (thirty) days. X 6 months   DONEPEZIL (ARICEPT) 10 MG TABLET    Take 1 tablet (10 mg total) by mouth daily.   HYDROCODONE-ACETAMINOPHEN (NORCO/VICODIN) 5-325 MG PER TABLET    Take one tablet by mouth twice daily (am and at bedtime) for orthopedic pain   MEMANTINE (NAMENDA) 10 MG TABLET    Take 10 mg by mouth 2 (two) times daily.     MUPIROCIN (BACTROBAN) 2 % OINTMENT    Apply topically 2 (two) times daily. To affected area.   POTASSIUM CHLORIDE 20 MEQ/15ML (10%) SOLUTION    TAKE 1 TABLESPOONFUL DAILY   RIVASTIGMINE (EXELON) 9.5 MG/24HR    Place 1 patch onto the skin daily.   SERTRALINE (ZOLOFT) 25 MG TABLET    TAKE ONE TABLET DAILY BY MOUTH FOR DEPRESSION    SIGNIFICANT DIAGNOSTIC EXAMS    LABS REVIEWED:    03-08-13: wbc 21; hgb 15.0; hct 43.0; mcv 84.5; plt 165; glucose 56; bun 23; creat 1.02; k+ 3.9; na++141 liver normal albumin 4.1   Review of Systems  Unable to perform ROS   Physical Exam  Constitutional:  thin  Neck: Neck supple. No thyromegaly present.  Cardiovascular: Normal rate, regular rhythm and intact distal pulses.   Respiratory: Effort normal and breath sounds normal. No respiratory distress. He has no wheezes.  GI: Soft. Bowel sounds are normal. He exhibits no distension. There is no tenderness.  Musculoskeletal: He exhibits no edema.  Is able to move extremities  Neurological: He is alert.  Skin: Skin is warm and dry.      ASSESSMENT/ PLAN:  DEPRESSION He is presently stable on his current dose of zoloft 100 mg daily will monitor his status   Dementia Is without change in status; will continue exelon patch 13.3 mg daily and namenda xr 28 mg daily and will monitor   GASTRIC ULCER, W/O HEMORRHAGE Is presently stable will continue nexium 40 mg daily   DYSLIPIDEMIA Will continue lipitor 10 mg daily and will monitor   Rhinorrhea Will continue claritin 10 mg daily   TIA Is stable will continue asa 81 mg daily   Insomnia Will continue melatonin 3 mg nightly   Constipation Will continue senna s 2 tabs daily and will monitor  BACK PAIN, LOW Pain is being managed vicodin 5/325 mg twice daily   BPH (benign prostatic hyperplasia) Will continue flomax daily and will monitor   Time spent with patient 50 minutes

## 2013-06-06 NOTE — Assessment & Plan Note (Signed)
Will continue claritin 10 mg daily  

## 2013-06-06 NOTE — Assessment & Plan Note (Signed)
Will continue melatonin 3 mg nightly

## 2013-06-06 NOTE — Assessment & Plan Note (Signed)
Will continue senna s 2 tabs daily and will monitor

## 2013-06-06 NOTE — Assessment & Plan Note (Signed)
Is stable will continue asa 81 mg daily  

## 2013-06-06 NOTE — Assessment & Plan Note (Addendum)
He is presently stable on his current dose of zoloft 100 mg daily will monitor his status

## 2013-06-06 NOTE — Assessment & Plan Note (Addendum)
Is without change in status; will continue exelon patch 13.3 mg daily and namenda xr 28 mg daily and will monitor

## 2013-06-19 NOTE — Progress Notes (Signed)
This encounter was created in error - please disregard.

## 2013-08-04 ENCOUNTER — Emergency Department (HOSPITAL_COMMUNITY): Payer: Medicare Other

## 2013-08-04 ENCOUNTER — Non-Acute Institutional Stay (SKILLED_NURSING_FACILITY): Payer: Medicare Other | Admitting: Nurse Practitioner

## 2013-08-04 ENCOUNTER — Encounter (HOSPITAL_COMMUNITY): Payer: Self-pay | Admitting: Emergency Medicine

## 2013-08-04 ENCOUNTER — Encounter: Payer: Self-pay | Admitting: Nurse Practitioner

## 2013-08-04 ENCOUNTER — Inpatient Hospital Stay (HOSPITAL_COMMUNITY)
Admission: EM | Admit: 2013-08-04 | Discharge: 2013-08-08 | DRG: 085 | Disposition: A | Discharge status: Skilled Nursing Facility | Payer: Medicare Other | Attending: Internal Medicine | Admitting: Internal Medicine

## 2013-08-04 DIAGNOSIS — Z23 Encounter for immunization: Secondary | ICD-10-CM

## 2013-08-04 DIAGNOSIS — F1021 Alcohol dependence, in remission: Secondary | ICD-10-CM

## 2013-08-04 DIAGNOSIS — Z79899 Other long term (current) drug therapy: Secondary | ICD-10-CM

## 2013-08-04 DIAGNOSIS — Y9210 Unspecified residential institution as the place of occurrence of the external cause: Secondary | ICD-10-CM | POA: Diagnosis present

## 2013-08-04 DIAGNOSIS — F0391 Unspecified dementia with behavioral disturbance: Secondary | ICD-10-CM

## 2013-08-04 DIAGNOSIS — K259 Gastric ulcer, unspecified as acute or chronic, without hemorrhage or perforation: Secondary | ICD-10-CM

## 2013-08-04 DIAGNOSIS — I2510 Atherosclerotic heart disease of native coronary artery without angina pectoris: Secondary | ICD-10-CM

## 2013-08-04 DIAGNOSIS — F329 Major depressive disorder, single episode, unspecified: Secondary | ICD-10-CM

## 2013-08-04 DIAGNOSIS — Z681 Body mass index (BMI) 19 or less, adult: Secondary | ICD-10-CM

## 2013-08-04 DIAGNOSIS — L989 Disorder of the skin and subcutaneous tissue, unspecified: Secondary | ICD-10-CM

## 2013-08-04 DIAGNOSIS — W19XXXD Unspecified fall, subsequent encounter: Secondary | ICD-10-CM

## 2013-08-04 DIAGNOSIS — D126 Benign neoplasm of colon, unspecified: Secondary | ICD-10-CM

## 2013-08-04 DIAGNOSIS — S065XAA Traumatic subdural hemorrhage with loss of consciousness status unknown, initial encounter: Secondary | ICD-10-CM

## 2013-08-04 DIAGNOSIS — G479 Sleep disorder, unspecified: Secondary | ICD-10-CM

## 2013-08-04 DIAGNOSIS — S0990XA Unspecified injury of head, initial encounter: Secondary | ICD-10-CM

## 2013-08-04 DIAGNOSIS — M545 Low back pain, unspecified: Secondary | ICD-10-CM

## 2013-08-04 DIAGNOSIS — E039 Hypothyroidism, unspecified: Secondary | ICD-10-CM | POA: Diagnosis present

## 2013-08-04 DIAGNOSIS — G459 Transient cerebral ischemic attack, unspecified: Secondary | ICD-10-CM

## 2013-08-04 DIAGNOSIS — W19XXXA Unspecified fall, initial encounter: Secondary | ICD-10-CM

## 2013-08-04 DIAGNOSIS — Z951 Presence of aortocoronary bypass graft: Secondary | ICD-10-CM

## 2013-08-04 DIAGNOSIS — R911 Solitary pulmonary nodule: Secondary | ICD-10-CM

## 2013-08-04 DIAGNOSIS — I4891 Unspecified atrial fibrillation: Secondary | ICD-10-CM | POA: Diagnosis present

## 2013-08-04 DIAGNOSIS — T148XXA Other injury of unspecified body region, initial encounter: Secondary | ICD-10-CM

## 2013-08-04 DIAGNOSIS — G4700 Insomnia, unspecified: Secondary | ICD-10-CM

## 2013-08-04 DIAGNOSIS — Z8673 Personal history of transient ischemic attack (TIA), and cerebral infarction without residual deficits: Secondary | ICD-10-CM

## 2013-08-04 DIAGNOSIS — E785 Hyperlipidemia, unspecified: Secondary | ICD-10-CM

## 2013-08-04 DIAGNOSIS — I619 Nontraumatic intracerebral hemorrhage, unspecified: Secondary | ICD-10-CM | POA: Diagnosis present

## 2013-08-04 DIAGNOSIS — K1370 Unspecified lesions of oral mucosa: Secondary | ICD-10-CM

## 2013-08-04 DIAGNOSIS — S065X0A Traumatic subdural hemorrhage without loss of consciousness, initial encounter: Principal | ICD-10-CM | POA: Diagnosis present

## 2013-08-04 DIAGNOSIS — E119 Type 2 diabetes mellitus without complications: Secondary | ICD-10-CM | POA: Diagnosis present

## 2013-08-04 DIAGNOSIS — W050XXA Fall from non-moving wheelchair, initial encounter: Secondary | ICD-10-CM | POA: Diagnosis present

## 2013-08-04 DIAGNOSIS — K5900 Constipation, unspecified: Secondary | ICD-10-CM

## 2013-08-04 DIAGNOSIS — E43 Unspecified severe protein-calorie malnutrition: Secondary | ICD-10-CM

## 2013-08-04 DIAGNOSIS — S065X9A Traumatic subdural hemorrhage with loss of consciousness of unspecified duration, initial encounter: Secondary | ICD-10-CM

## 2013-08-04 DIAGNOSIS — F03918 Unspecified dementia, unspecified severity, with other behavioral disturbance: Secondary | ICD-10-CM

## 2013-08-04 DIAGNOSIS — F0390 Unspecified dementia without behavioral disturbance: Secondary | ICD-10-CM

## 2013-08-04 DIAGNOSIS — N400 Enlarged prostate without lower urinary tract symptoms: Secondary | ICD-10-CM

## 2013-08-04 DIAGNOSIS — F3289 Other specified depressive episodes: Secondary | ICD-10-CM

## 2013-08-04 DIAGNOSIS — Z7982 Long term (current) use of aspirin: Secondary | ICD-10-CM

## 2013-08-04 DIAGNOSIS — I6200 Nontraumatic subdural hemorrhage, unspecified: Secondary | ICD-10-CM

## 2013-08-04 DIAGNOSIS — F411 Generalized anxiety disorder: Secondary | ICD-10-CM

## 2013-08-04 DIAGNOSIS — J3489 Other specified disorders of nose and nasal sinuses: Secondary | ICD-10-CM

## 2013-08-04 DIAGNOSIS — I10 Essential (primary) hypertension: Secondary | ICD-10-CM

## 2013-08-04 DIAGNOSIS — Z87891 Personal history of nicotine dependence: Secondary | ICD-10-CM

## 2013-08-04 DIAGNOSIS — M129 Arthropathy, unspecified: Secondary | ICD-10-CM

## 2013-08-04 HISTORY — DX: Muscle weakness (generalized): M62.81

## 2013-08-04 HISTORY — DX: Unspecified psychosis not due to a substance or known physiological condition: F29

## 2013-08-04 HISTORY — DX: Traumatic subdural hemorrhage with loss of consciousness of unspecified duration, initial encounter: S06.5X9A

## 2013-08-04 HISTORY — DX: Traumatic subdural hemorrhage with loss of consciousness status unknown, initial encounter: S06.5XAA

## 2013-08-04 HISTORY — DX: Unspecified lack of coordination: R27.9

## 2013-08-04 HISTORY — DX: Extrapyramidal and movement disorder, unspecified: G25.9

## 2013-08-04 HISTORY — DX: Altered mental status, unspecified: R41.82

## 2013-08-04 HISTORY — DX: Unspecified mood (affective) disorder: F39

## 2013-08-04 LAB — CBC WITH DIFFERENTIAL/PLATELET
Basophils Relative: 0 % (ref 0–1)
Eosinophils Absolute: 0.1 10*3/uL (ref 0.0–0.7)
Eosinophils Relative: 1 % (ref 0–5)
HCT: 39.9 % (ref 39.0–52.0)
Hemoglobin: 13.3 g/dL (ref 13.0–17.0)
Lymphs Abs: 1.5 10*3/uL (ref 0.7–4.0)
MCH: 28.8 pg (ref 26.0–34.0)
MCHC: 33.3 g/dL (ref 30.0–36.0)
MCV: 86.4 fL (ref 78.0–100.0)
Monocytes Absolute: 0.8 10*3/uL (ref 0.1–1.0)
Monocytes Relative: 9 % (ref 3–12)
RBC: 4.62 MIL/uL (ref 4.22–5.81)

## 2013-08-04 LAB — COMPREHENSIVE METABOLIC PANEL
Albumin: 4 g/dL (ref 3.5–5.2)
Alkaline Phosphatase: 238 U/L — ABNORMAL HIGH (ref 39–117)
BUN: 18 mg/dL (ref 6–23)
Creatinine, Ser: 0.81 mg/dL (ref 0.50–1.35)
GFR calc Af Amer: >90 mL/min (ref >90)
Glucose, Bld: 72 mg/dL (ref 70–99)
Total Protein: 7.4 g/dL (ref 6.0–8.3)

## 2013-08-04 LAB — PRO B NATRIURETIC PEPTIDE: Pro B Natriuretic peptide (BNP): 329.1 pg/mL (ref 0–450)

## 2013-08-04 LAB — PROTIME-INR
INR: 1.03 (ref 0.00–1.49)
Prothrombin Time: 13.3 seconds (ref 11.6–15.2)

## 2013-08-04 MED ORDER — ATORVASTATIN CALCIUM 10 MG PO TABS
10.0000 mg | ORAL_TABLET | Freq: Every day | ORAL | Status: DC
  Administered 2013-08-04 – 2013-08-07 (×4): 10 mg via ORAL
  Filled 2013-08-04 (×5): qty 1

## 2013-08-04 MED ORDER — SERTRALINE HCL 100 MG PO TABS
100.0000 mg | ORAL_TABLET | Freq: Every day | ORAL | Status: DC
  Administered 2013-08-04 – 2013-08-08 (×5): 100 mg via ORAL
  Filled 2013-08-04 (×5): qty 1

## 2013-08-04 MED ORDER — SODIUM CHLORIDE 0.9 % IV SOLN: INTRAVENOUS | Status: DC

## 2013-08-04 MED ORDER — RIVASTIGMINE 13.3 MG/24HR TD PT24
13.3000 mg | MEDICATED_PATCH | Freq: Every day | TRANSDERMAL | Status: DC
  Administered 2013-08-05 – 2013-08-08 (×5): 13.3 mg via TRANSDERMAL
  Filled 2013-08-04 (×5): qty 1

## 2013-08-04 MED ORDER — SENNA-DOCUSATE SODIUM 8.6-50 MG PO TABS: 2.0000 | ORAL_TABLET | Freq: Every day | ORAL | Status: DC

## 2013-08-04 MED ORDER — SODIUM CHLORIDE 0.9 % IJ SOLN
3.0000 mL | Freq: Two times a day (BID) | INTRAMUSCULAR | Status: DC
  Administered 2013-08-04 – 2013-08-07 (×6): 3 mL via INTRAVENOUS

## 2013-08-04 MED ORDER — SODIUM CHLORIDE 0.9 % IJ SOLN: 3.0000 mL | INTRAMUSCULAR | Status: DC | PRN

## 2013-08-04 MED ORDER — SENNOSIDES-DOCUSATE SODIUM 8.6-50 MG PO TABS
2.0000 | ORAL_TABLET | Freq: Every day | ORAL | Status: DC
  Administered 2013-08-04 – 2013-08-07 (×4): 2 via ORAL
  Filled 2013-08-04 (×3): qty 2

## 2013-08-04 MED ORDER — ARIPIPRAZOLE 2 MG PO TABS
2.0000 mg | ORAL_TABLET | Freq: Every day | ORAL | Status: DC
  Administered 2013-08-05 – 2013-08-08 (×4): 2 mg via ORAL
  Filled 2013-08-04 (×4): qty 1

## 2013-08-04 MED ORDER — HYDROCODONE-ACETAMINOPHEN 5-325 MG PO TABS
1.0000 | ORAL_TABLET | Freq: Two times a day (BID) | ORAL | Status: DC | PRN
  Administered 2013-08-05: 1 via ORAL
  Filled 2013-08-04: qty 1

## 2013-08-04 MED ORDER — TAMSULOSIN HCL 0.4 MG PO CAPS
0.4000 mg | ORAL_CAPSULE | Freq: Every day | ORAL | Status: DC
  Administered 2013-08-05 – 2013-08-08 (×4): 0.4 mg via ORAL
  Filled 2013-08-04 (×4): qty 1

## 2013-08-04 MED ORDER — MEMANTINE HCL 10 MG PO TABS
10.0000 mg | ORAL_TABLET | Freq: Two times a day (BID) | ORAL | Status: DC
  Administered 2013-08-04 – 2013-08-08 (×8): 10 mg via ORAL
  Filled 2013-08-04 (×9): qty 1

## 2013-08-04 MED ORDER — SODIUM CHLORIDE 0.9 % IV SOLN
250.0000 mL | INTRAVENOUS | Status: DC | PRN
  Administered 2013-08-04: 250 mL via INTRAVENOUS

## 2013-08-04 NOTE — H&P (Signed)
[facility] History and Physical  MICHAIAH HOLSOPPLE AOZ:[id number] DOB: 06-03-1932 DOA: 08/04/2013  Referring physician:  PCP: [name], MD  Specialists: Neurosurgery  Chief Complaint: Status post fall  HPI: [name] is a 79 y.o. male with multiple comorbidities including advanced dementia, coronary artery disease, status post coronary artery bypass grafting who had been on aspirin therapy, having a fall at his skilled nursing facility. History was obtained from emergency room physician as patient unable to provide a reliable history or participate in his own plan of care given advanced dementia. Patient was in his usual state of health today, stood up from his wheelchair falling forward and striking right frontal region against the floor. Per nursing home staff, patient did not complain of any symptoms prior to the fall. There was no loss of consciousness. In the emergency room he was found to have abrasion to the right for head and right elbow. A CT scan of brain however revealed the presence of multiple subdural hematomas and areas of right frontal cortical hemorrhage. Dr. Newell [name] of neurosurgery was consulted by ED physician. Surgical intervention was not recommended at this time. Dr Newell [name] recommended patient be admitted with a repeat CT scan of brain to be scheduled on Saturday morning. Antiplatelet therapy to be discontinued.           Review of Systems: Patient with advanced dementia, could not obtain a reliable review of systems  Past Medical History  Diagnosis Date  . Anxiety state, unspecified   . Arthritis     fingers/shoulders  . Unspecified cataract   . Colon polyps     last colonoscopy 2010  . CAD (coronary artery disease) 2006    s/p CABG  . Senile dementia, uncomplicated   . Depression   . HLD (hyperlipidemia)   . Gastric ulcer, unspecified as acute or chronic, without mention of hemorrhage, perforation, or obstruction   . Hypertension   . Insomnia    . TIA (transient ischemic attack)   . Clostridium difficile infection 2012    recurrent-last treatment 10/2010  . BPH (benign prostatic hypertrophy)   . H/O: hypothyroidism   . History of chronic pancreatitis   . Alcohol abuse, in remission     sober x years  . Paroxysmal a-fib     history of  . Hx: UTI (urinary tract infection)   . Diabetes mellitus type II     history of  . ARF (acute renal failure)     history of  . History of chicken pox   . Pulmonary nodule/lesion, solitary 05/2010    4mm L lung base nodule, if high risk rec f/u with CT 1 yr, if low risk, no f/u needed  . Muscle weakness (generalized)   . Lack of coordination   . Psychosis   . Episodic mood disorder   . Extrapyramidal disorder   . Altered mental status    Past Surgical History  Procedure Laterality Date  . Coronary artery bypass graft  2006  . Injection knee  09/06/2001  . Cardiovascular stress test  05/2008    small regions of perfusion abnormality suggestive of diaphragm artifact, exercise cap 7 METs   Social History:  reports that he quit smoking about 7 years ago. He does not have any smokeless tobacco history on file. He reports that he does not drink alcohol or use illicit drugs. Patient with a history of alcohol and tobacco abuse, reportedly quit smoking about 7 years ago. He has a history of  advanced dementia and requires assistance with activities of daily living  CODE STATUS: Full code  Allergies  Allergen Reactions  . Codeine     REACTION: Rash,itching    Family History  Problem Relation Age of Onset  . Dementia Father     ? Alzheimers  . Heart attack Father   . Hypertension Brother   . Diabetes Brother    Prior to Admission medications   Medication Sig Start Date End Date Taking? Authorizing Provider  ARIPiprazole (ABILIFY) 2 MG tablet Take 2 mg by mouth daily.   Yes Historical Provider, MD  aspirin 81 MG EC tablet Take 81 mg by mouth daily.     Yes Historical Provider, MD   atorvastatin (LIPITOR) 10 MG tablet Take 10 mg by mouth daily.   Yes Historical Provider, MD  cholecalciferol (VITAMIN D) 400 UNITS TABS tablet Take 400 Units by mouth daily.   Yes Historical Provider, MD  cyanocobalamin 1000 MCG tablet Take 100 mcg by mouth daily.   Yes Historical Provider, MD  HYDROcodone-acetaminophen (NORCO/VICODIN) 5-325 MG per tablet Take 1 tablet by mouth 2 (two) times daily.   Yes Historical Provider, MD  loratadine (CLARITIN) 10 MG tablet Take 10 mg by mouth daily.   Yes Historical Provider, MD  Melatonin 3 MG TABS Take 3 mg by mouth at bedtime.   Yes Historical Provider, MD  memantine (NAMENDA) 10 MG tablet Take 10 mg by mouth 2 (two) times daily.   Yes Historical Provider, MD  Multiple Vitamin (MULTIVITAMIN) tablet Take 1 tablet by mouth daily.     Yes Historical Provider, MD  Rivastigmine (EXELON) 13.3 MG/24HR PT24 Place 13.3 mg onto the skin daily.   Yes Historical Provider, MD  sennosides-docusate sodium (SENOKOT-S) 8.6-50 MG tablet Take 2 tablets by mouth daily.   Yes Historical Provider, MD  sertraline (ZOLOFT) 100 MG tablet Take 100 mg by mouth daily.   Yes Historical Provider, MD  Tamsulosin HCl (FLOMAX) 0.4 MG CAPS Take 0.4 mg by mouth daily.     Yes Historical Provider, MD   Physical Exam: Filed Vitals:   08/04/13 1800  BP: 117/63  Pulse: 78  Temp:   Resp:      General:  Patient does not appear to be in acute distress, he is mildly agitated in the emergency room, not following commands, confused and disoriented.  Eyes: Pupils equal round reactive to light, to millimeters in diameter, extraocular movement appears to be intact.  Neck: Neck is supple symmetrical no jugular venous distention or nuchal rigidity  Cardiovascular: Regular rate and rhythm normal S1-S2 no murmurs rubs or gallops  Respiratory: Lungs are clear to auscultation bilaterally no wheezing rhonchi or rales  Abdomen: Soft nontender nondistended positive bowel sounds in upper  quadrant  Skin: Patient having abrasion to his right elbow along with abrasion to right frontal region.  Musculoskeletal: Present range of motion to all extremities  Psychiatric: Patient is confused, disoriented, appears to be easily distracted  Neurologic: Difficult to perform adequate neurologic examination given advanced dementia. He does move all extremities, does not appear to have alteration to sensation, having difficulties following commands however.   Labs on Admission:  Basic Metabolic Panel:  Recent Labs Lab 08/04/13 1640  NA 145  K 4.3  CL 107  CO2 28  GLUCOSE 72  BUN 18  CREATININE 0.81  CALCIUM 9.4   Liver Function Tests:  Recent Labs Lab 08/04/13 1640  AST 16  ALT 10  ALKPHOS 238*  BILITOT 0.8  PROT 7.4  ALBUMIN 4.0   No results found for this basename: LIPASE, AMYLASE,  in the last 168 hours No results found for this basename: AMMONIA,  in the last 168 hours CBC:  Recent Labs Lab 08/04/13 1640  WBC 8.9  NEUTROABS 6.6  HGB 13.3  HCT 39.9  MCV 86.4  PLT 150   Cardiac Enzymes: No results found for this basename: CKTOTAL, CKMB, CKMBINDEX, TROPONINI,  in the last 168 hours  BNP (last 3 results)  Recent Labs  08/04/13 1640  PROBNP 329.1   CBG: No results found for this basename: GLUCAP,  in the last 168 hours  Radiological Exams on Admission: Dg Elbow Complete Right  08/04/2013   CLINICAL DATA:  Fall.  EXAM: RIGHT ELBOW - COMPLETE 3+ VIEW  COMPARISON:  None.  FINDINGS: Standard imaging was not able to be performed.  Degenerative changes without fracture or dislocation noted.  IMPRESSION: No fracture or dislocation is noted however, standard imaging was not able to be performed and therefore evaluation slightly limited.  Prominent olecranon spur.   Electronically Signed   By: [name] M.D.   On: 08/04/2013 13:59   Ct Head Wo Contrast  08/04/2013   CLINICAL DATA:  Right frontal soft tissue swelling following a fall. Unable to give  history.  EXAM: CT HEAD WITHOUT CONTRAST  CT MAXILLOFACIAL WITHOUT CONTRAST  CT CERVICAL SPINE WITHOUT CONTRAST  TECHNIQUE: Multidetector CT imaging of the head, cervical spine, and maxillofacial structures were performed using the standard protocol without intravenous contrast. Multiplanar CT image reconstructions of the cervical spine and maxillofacial structures were also generated.  COMPARISON:  Previous examinations, the most recent dated 11/12/2009.  FINDINGS: CT HEAD FINDINGS  Bilateral parafalcine subdural hematomas, measuring 6 mm in thickness on the right and 4 mm in thickness on the left. There is also a small focal right frontal subdural hematoma measuring 8 mm in maximum thickness. There is also a small right lateral subdural hematoma with a maximum thickness of 4 mm. There are also small areas of cortical hemorrhage in the right frontal lobe anteriorly. No mass effect. Diffusely enlarged ventricles and subarachnoid spaces. Patchy white matter low density in both cerebral hemispheres. Right frontal scalp hematoma. No skull fractures or paranasal sinus air-fluid levels. Bilateral posterior ethmoid sinus mucosal thickening.  CT MAXILLOFACIAL FINDINGS  The examination is limited by artifacts produced by uncontrollable mouth movements. No facial fractures or paranasal sinus air-fluid levels are seen.  CT CERVICAL SPINE FINDINGS  Multilevel degenerative changes. Mild dextroconvex scoliosis. No prevertebral soft tissue swelling, fractures or subluxations. Bilateral carotid artery calcifications. Biapical pleural and parenchymal scarring.  IMPRESSION: CT HEAD IMPRESSION  1. Multiple subdural hematomas and areas of right frontal cortical hemorrhage, as described above. 2. Right frontal scalp hematoma. 3. Atrophy and chronic small vessel white matter ischemic changes. 4. Chronic bilateral ethmoid sinusitis.  CT MAXILLOFACIAL IMPRESSION  Limited examination with no visible facial fractures or paranasal sinus  air-fluid levels.  CT CERVICAL SPINE IMPRESSION  1. No fracture or subluxation. 2. Multilevel degenerative changes. 3. Bilateral carotid artery atheromatous calcifications. These results were called by telephone at the time of interpretation on 08/04/2013 at 3:03 p.m. to Dr. Vanetta Mulders , who verbally acknowledged these results.   Electronically Signed   By: Gordan Payment M.D.   On: 08/04/2013 15:03   Ct Cervical Spine Wo Contrast  08/04/2013   CLINICAL DATA:  Right frontal soft tissue swelling following a fall. Unable to give history.  EXAM: CT  HEAD WITHOUT CONTRAST  CT MAXILLOFACIAL WITHOUT CONTRAST  CT CERVICAL SPINE WITHOUT CONTRAST  TECHNIQUE: Multidetector CT imaging of the head, cervical spine, and maxillofacial structures were performed using the standard protocol without intravenous contrast. Multiplanar CT image reconstructions of the cervical spine and maxillofacial structures were also generated.  COMPARISON:  Previous examinations, the most recent dated 11/12/2009.  FINDINGS: CT HEAD FINDINGS  Bilateral parafalcine subdural hematomas, measuring 6 mm in thickness on the right and 4 mm in thickness on the left. There is also a small focal right frontal subdural hematoma measuring 8 mm in maximum thickness. There is also a small right lateral subdural hematoma with a maximum thickness of 4 mm. There are also small areas of cortical hemorrhage in the right frontal lobe anteriorly. No mass effect. Diffusely enlarged ventricles and subarachnoid spaces. Patchy white matter low density in both cerebral hemispheres. Right frontal scalp hematoma. No skull fractures or paranasal sinus air-fluid levels. Bilateral posterior ethmoid sinus mucosal thickening.  CT MAXILLOFACIAL FINDINGS  The examination is limited by artifacts produced by uncontrollable mouth movements. No facial fractures or paranasal sinus air-fluid levels are seen.  CT CERVICAL SPINE FINDINGS  Multilevel degenerative changes. Mild dextroconvex  scoliosis. No prevertebral soft tissue swelling, fractures or subluxations. Bilateral carotid artery calcifications. Biapical pleural and parenchymal scarring.  IMPRESSION: CT HEAD IMPRESSION  1. Multiple subdural hematomas and areas of right frontal cortical hemorrhage, as described above. 2. Right frontal scalp hematoma. 3. Atrophy and chronic small vessel white matter ischemic changes. 4. Chronic bilateral ethmoid sinusitis.  CT MAXILLOFACIAL IMPRESSION  Limited examination with no visible facial fractures or paranasal sinus air-fluid levels.  CT CERVICAL SPINE IMPRESSION  1. No fracture or subluxation. 2. Multilevel degenerative changes. 3. Bilateral carotid artery atheromatous calcifications. These results were called by telephone at the time of interpretation on 08/04/2013 at 3:03 p.m. to Dr. Vanetta Mulders , who verbally acknowledged these results.   Electronically Signed   By: Gordan Payment M.D.   On: 08/04/2013 15:03   Dg Chest Port 1 View  08/04/2013   CLINICAL DATA:  Fall, head injury, altered mental status, history coronary disease, hypertension, diabetes, renal failure  EXAM: PORTABLE CHEST - 1 VIEW  COMPARISON:  Portable exam at 1646 hr compared to 11/12/2009  FINDINGS: Normal heart size post CABG.  Mediastinal contours and pulmonary vascularity normal.  Biapical scarring.  Lungs otherwise clear.  No pleural effusion or pneumothorax.  Posttraumatic deformities of multiple left ribs, old and unchanged.  Diffuse osseous demineralization.  IMPRESSION: No acute abnormalities.   Electronically Signed   By: Ulyses Southward M.D.   On: 08/04/2013 17:03   Ct Maxillofacial Wo Cm  08/04/2013   CLINICAL DATA:  Right frontal soft tissue swelling following a fall. Unable to give history.  EXAM: CT HEAD WITHOUT CONTRAST  CT MAXILLOFACIAL WITHOUT CONTRAST  CT CERVICAL SPINE WITHOUT CONTRAST  TECHNIQUE: Multidetector CT imaging of the head, cervical spine, and maxillofacial structures were performed using the standard  protocol without intravenous contrast. Multiplanar CT image reconstructions of the cervical spine and maxillofacial structures were also generated.  COMPARISON:  Previous examinations, the most recent dated 11/12/2009.  FINDINGS: CT HEAD FINDINGS  Bilateral parafalcine subdural hematomas, measuring 6 mm in thickness on the right and 4 mm in thickness on the left. There is also a small focal right frontal subdural hematoma measuring 8 mm in maximum thickness. There is also a small right lateral subdural hematoma with a maximum thickness of 4 mm. There are  also small areas of cortical hemorrhage in the right frontal lobe anteriorly. No mass effect. Diffusely enlarged ventricles and subarachnoid spaces. Patchy white matter low density in both cerebral hemispheres. Right frontal scalp hematoma. No skull fractures or paranasal sinus air-fluid levels. Bilateral posterior ethmoid sinus mucosal thickening.  CT MAXILLOFACIAL FINDINGS  The examination is limited by artifacts produced by uncontrollable mouth movements. No facial fractures or paranasal sinus air-fluid levels are seen.  CT CERVICAL SPINE FINDINGS  Multilevel degenerative changes. Mild dextroconvex scoliosis. No prevertebral soft tissue swelling, fractures or subluxations. Bilateral carotid artery calcifications. Biapical pleural and parenchymal scarring.  IMPRESSION: CT HEAD IMPRESSION  1. Multiple subdural hematomas and areas of right frontal cortical hemorrhage, as described above. 2. Right frontal scalp hematoma. 3. Atrophy and chronic small vessel white matter ischemic changes. 4. Chronic bilateral ethmoid sinusitis.  CT MAXILLOFACIAL IMPRESSION  Limited examination with no visible facial fractures or paranasal sinus air-fluid levels.  CT CERVICAL SPINE IMPRESSION  1. No fracture or subluxation. 2. Multilevel degenerative changes. 3. Bilateral carotid artery atheromatous calcifications. These results were called by telephone at the time of interpretation on  08/04/2013 at 3:03 p.m. to Dr. Vanetta Mulders , who verbally acknowledged these results.   Electronically Signed   By: Gordan Payment M.D.   On: 08/04/2013 15:03     Assessment/Plan Principal Problem:   Subdural hematoma, post-traumatic Active Problems:   CORONARY, ARTERIOSCLEROSIS   HYPERTENSION, ESSENTIAL   Dementia   Fall at nursing home   1. Subdural hematoma. Patient with a history of advanced dementia, having a fall at his nursing home which resulted in multiple subdural hematomas per radiology report of head CT. Dr. Newell [name] of neurosurgery consulted by ED physician. Surgical intervention was not recommended at this point. He recommended observing patient conservatively, discontinuing antiplatelet therapy, and repeating a CT scan on Saturday morning. Will place patient on telemetry, perform neuro checks overnight, supportive care, reassess in morning. Nor has been placed for repeat CT scan on Saturday, 08/06/2013.  2. Status post fall. Patient having a fall in his nursing home. Difficult historian though it appears fall was mechanical. There were no reports of chest pain shortness of breath dizziness or lightheadedness. Nursing home staff have reported that patient stood up from his wheelchair, losing his balance and falling forward. 3. Coronary artery disease. Patient with a history of established coronary artery disease, status post coronary artery bypass grafting. Due to the presence of subdural hematomas, we'll discontinue aspirin therapy. 4.  Atrial fibrillation. EKG showing A. fib, presently rate controlled with a pulse of 72. Patient for candidate for anticoagulation given recurrent falls as well as subdural hematoma. 5. Advanced dementia. Will continue patient on Namenda and Exelon patch for now.  6. Dyslipidemia. Continue statin therapy 7. Diet. Heart healthy 8. DVT prophylaxis. Bilateral extremity SCDs    Family Communication: Attempts were made to reach family in the ED. Family  not present during my evaluation Disposition Plan: Will admit to telemetry, I anticipate with require at least 2 nights hospitalization   Time spent: 70 minutes  Jeralyn Bennett [facility] Pager [phone]  If 7PM-7AM, please contact night-coverage [email or web address] Password [email or web address] 08/04/2013, 7:08 PM

## 2013-08-04 NOTE — ED Notes (Signed)
Pt coming from [facility] vis [facility].  Staff called EMS after a witnessed fall.  Pt stood up from his wheelchair and fell straight forward.  Noted abrasion to right forehead and right elbow.  Unknown LOC.  Hx of dementia, altered mental status, and unable to be a historian.

## 2013-08-04 NOTE — ED Notes (Signed)
Pt belongings in belongings bag at bedside.

## 2013-08-04 NOTE — Consult Note (Signed)
Reason for Consult:  Head trauma Referring Physician:  Shelda Jakes, MD  [name] is an 79 y.o. male.  HPI: Patient is an elderly male, a resident of a skilled nursing facility, with a with a history of significant dementia, felt to be a vascular basis. He has a number of significant medical comorbidities including hypertension, coronary artery disease, stroke, hypercholesterolemia, benign prostatic hypertrophy, depression, paranoia, and so on. Patient reportedly fell at the SNF and was brought to the [facility] emergency room, where he was evaluated by Shelda Jakes, MD (EDP). CT scan of the brain was obtained and shows significant generalized cerebral atrophy and a number of areas of small amounts of intracranial hemorrhage including thin bilateral parafalcine subdural hematomas, a small bit of subdural hemorrhage over the right frontal lobe, and a small right frontal hemorrhagic cerebral contusion. None of these cause mass effect or midline shift.  Neurosurgical consultation was requested by Shelda Jakes, MD, and I explained to him that there is no indication for neurosurgical intervention. I favor continued supportive care. No anticoagulants or antiplatelet agents. A followup CT scan of the brain should be done on the morning of Saturday, October 11.  The patient himself is unable to provide any history do to his profound dementia.  Past Medical History:  Past Medical History  Diagnosis Date  . Anxiety state, unspecified   . Arthritis     fingers/shoulders  . Unspecified cataract   . Colon polyps     last colonoscopy 2010  . CAD (coronary artery disease) 2006    s/p CABG  . Senile dementia, uncomplicated   . Depression   . HLD (hyperlipidemia)   . Gastric ulcer, unspecified as acute or chronic, without mention of hemorrhage, perforation, or obstruction   . Hypertension   . Insomnia   . TIA (transient ischemic attack)   . Clostridium difficile infection  2012    recurrent-last treatment 10/2010  . BPH (benign prostatic hypertrophy)   . H/O: hypothyroidism   . History of chronic pancreatitis   . Alcohol abuse, in remission     sober x years  . Paroxysmal a-fib     history of  . Hx: UTI (urinary tract infection)   . Diabetes mellitus type II     history of  . ARF (acute renal failure)     history of  . History of chicken pox   . Pulmonary nodule/lesion, solitary 05/2010    4mm L lung base nodule, if high risk rec f/u with CT 1 yr, if low risk, no f/u needed  . Muscle weakness (generalized)   . Lack of coordination   . Psychosis   . Episodic mood disorder   . Extrapyramidal disorder   . Altered mental status     Past Surgical History:  Past Surgical History  Procedure Laterality Date  . Coronary artery bypass graft  2006  . Injection knee  09/06/2001  . Cardiovascular stress test  05/2008    small regions of perfusion abnormality suggestive of diaphragm artifact, exercise cap 7 METs    Family History:  Family History  Problem Relation Age of Onset  . Dementia Father     ? Alzheimers  . Heart attack Father   . Hypertension Brother   . Diabetes Brother     Social History:  reports that he quit smoking about 7 years ago. He does not have any smokeless tobacco history on file. He reports that he does not  drink alcohol or use illicit drugs.  Allergies:  Allergies  Allergen Reactions  . Codeine     REACTION: Rash,itching    Medications: I have reviewed the patient's current medications.  ROS:  Unobtainable due to the patient's severe dementia.  Physical Examination: Elderly white male, mildly restless, in no acute distress. Blood pressure 128/101, pulse 84, temperature 97.6 F (36.4 C), temperature source Axillary, resp. rate 17, SpO2 100.00%.  Neurological Examination: Mental Status Examination:  Awake and alert, oriented to his name, but not to place or time. Cranial Nerve Examination:  Pupils 2 mm, round,  minimally reactive to light. EOM eye. Facial movements symmetrical. Motor Examination:  Moving all extremities, although certainly moves the left upper extremity the least. Sensory Examination:  Senses pinprick. Reflex Examination:   Diminished. Gait and Stance Examination:  Not tested due to the nature the patient's condition.   Results for orders placed during the hospital encounter of 08/04/13 (from the past 48 hour(s))  CBC WITH DIFFERENTIAL     Status: None   Collection Time    08/04/13  4:40 PM      Result Value Range   WBC 8.9  4.0 - 10.5 K/uL   RBC 4.62  4.22 - 5.81 MIL/uL   Hemoglobin 13.3  13.0 - 17.0 g/dL   HCT 16.1  09.6 - 04.5 %   MCV 86.4  78.0 - 100.0 fL   MCH 28.8  26.0 - 34.0 pg   MCHC 33.3  30.0 - 36.0 g/dL   RDW 40.9  81.1 - 91.4 %   Platelets 150  150 - 400 K/uL   Neutrophils Relative % 74  43 - 77 %   Neutro Abs 6.6  1.7 - 7.7 K/uL   Lymphocytes Relative 17  12 - 46 %   Lymphs Abs 1.5  0.7 - 4.0 K/uL   Monocytes Relative 9  3 - 12 %   Monocytes Absolute 0.8  0.1 - 1.0 K/uL   Eosinophils Relative 1  0 - 5 %   Eosinophils Absolute 0.1  0.0 - 0.7 K/uL   Basophils Relative 0  0 - 1 %   Basophils Absolute 0.0  0.0 - 0.1 K/uL  COMPREHENSIVE METABOLIC PANEL     Status: Abnormal   Collection Time    08/04/13  4:40 PM      Result Value Range   Sodium 145  135 - 145 mEq/L   Potassium 4.3  3.5 - 5.1 mEq/L   Chloride 107  96 - 112 mEq/L   CO2 28  19 - 32 mEq/L   Glucose, Bld 72  70 - 99 mg/dL   BUN 18  6 - 23 mg/dL   Creatinine, Ser 7.82  0.50 - 1.35 mg/dL   Calcium 9.4  8.4 - 95.6 mg/dL   Total Protein 7.4  6.0 - 8.3 g/dL   Albumin 4.0  3.5 - 5.2 g/dL   AST 16  0 - 37 U/L   ALT 10  0 - 53 U/L   Alkaline Phosphatase 238 (*) 39 - 117 U/L   Total Bilirubin 0.8  0.3 - 1.2 mg/dL   GFR calc non Af Amer 82 (*) >90 mL/min   GFR calc Af Amer >90  >90 mL/min   Comment: (NOTE)     The eGFR has been calculated using the CKD EPI equation.     This calculation has not  been validated in all clinical situations.     eGFR's persistently <90 mL/min  signify possible Chronic Kidney     Disease.  PROTIME-INR     Status: None   Collection Time    08/04/13  4:40 PM      Result Value Range   Prothrombin Time 13.3  11.6 - 15.2 seconds   INR 1.03  0.00 - 1.49  PRO B NATRIURETIC PEPTIDE     Status: None   Collection Time    08/04/13  4:40 PM      Result Value Range   Pro B Natriuretic peptide (BNP) 329.1  0 - 450 pg/mL    Dg Elbow Complete Right  08/04/2013   CLINICAL DATA:  Fall.  EXAM: RIGHT ELBOW - COMPLETE 3+ VIEW  COMPARISON:  None.  FINDINGS: Standard imaging was not able to be performed.  Degenerative changes without fracture or dislocation noted.  IMPRESSION: No fracture or dislocation is noted however, standard imaging was not able to be performed and therefore evaluation slightly limited.  Prominent olecranon spur.   Electronically Signed   By: [name] M.D.   On: 08/04/2013 13:59   Ct Head Wo Contrast  08/04/2013   CLINICAL DATA:  Right frontal soft tissue swelling following a fall. Unable to give history.  EXAM: CT HEAD WITHOUT CONTRAST  CT MAXILLOFACIAL WITHOUT CONTRAST  CT CERVICAL SPINE WITHOUT CONTRAST  TECHNIQUE: Multidetector CT imaging of the head, cervical spine, and maxillofacial structures were performed using the standard protocol without intravenous contrast. Multiplanar CT image reconstructions of the cervical spine and maxillofacial structures were also generated.  COMPARISON:  Previous examinations, the most recent dated 11/12/2009.  FINDINGS: CT HEAD FINDINGS  Bilateral parafalcine subdural hematomas, measuring 6 mm in thickness on the right and 4 mm in thickness on the left. There is also a small focal right frontal subdural hematoma measuring 8 mm in maximum thickness. There is also a small right lateral subdural hematoma with a maximum thickness of 4 mm. There are also small areas of cortical hemorrhage in the right frontal lobe  anteriorly. No mass effect. Diffusely enlarged ventricles and subarachnoid spaces. Patchy white matter low density in both cerebral hemispheres. Right frontal scalp hematoma. No skull fractures or paranasal sinus air-fluid levels. Bilateral posterior ethmoid sinus mucosal thickening.  CT MAXILLOFACIAL FINDINGS  The examination is limited by artifacts produced by uncontrollable mouth movements. No facial fractures or paranasal sinus air-fluid levels are seen.  CT CERVICAL SPINE FINDINGS  Multilevel degenerative changes. Mild dextroconvex scoliosis. No prevertebral soft tissue swelling, fractures or subluxations. Bilateral carotid artery calcifications. Biapical pleural and parenchymal scarring.  IMPRESSION: CT HEAD IMPRESSION  1. Multiple subdural hematomas and areas of right frontal cortical hemorrhage, as described above. 2. Right frontal scalp hematoma. 3. Atrophy and chronic small vessel white matter ischemic changes. 4. Chronic bilateral ethmoid sinusitis.  CT MAXILLOFACIAL IMPRESSION  Limited examination with no visible facial fractures or paranasal sinus air-fluid levels.  CT CERVICAL SPINE IMPRESSION  1. No fracture or subluxation. 2. Multilevel degenerative changes. 3. Bilateral carotid artery atheromatous calcifications. These results were called by telephone at the time of interpretation on 08/04/2013 at 3:03 p.m. to Dr. Vanetta Mulders , who verbally acknowledged these results.   Electronically Signed   By: Gordan Payment M.D.   On: 08/04/2013 15:03   Ct Cervical Spine Wo Contrast  08/04/2013   CLINICAL DATA:  Right frontal soft tissue swelling following a fall. Unable to give history.  EXAM: CT HEAD WITHOUT CONTRAST  CT MAXILLOFACIAL WITHOUT CONTRAST  CT CERVICAL SPINE WITHOUT CONTRAST  TECHNIQUE: Multidetector CT imaging of the head, cervical spine, and maxillofacial structures were performed using the standard protocol without intravenous contrast. Multiplanar CT image reconstructions of the cervical  spine and maxillofacial structures were also generated.  COMPARISON:  Previous examinations, the most recent dated 11/12/2009.  FINDINGS: CT HEAD FINDINGS  Bilateral parafalcine subdural hematomas, measuring 6 mm in thickness on the right and 4 mm in thickness on the left. There is also a small focal right frontal subdural hematoma measuring 8 mm in maximum thickness. There is also a small right lateral subdural hematoma with a maximum thickness of 4 mm. There are also small areas of cortical hemorrhage in the right frontal lobe anteriorly. No mass effect. Diffusely enlarged ventricles and subarachnoid spaces. Patchy white matter low density in both cerebral hemispheres. Right frontal scalp hematoma. No skull fractures or paranasal sinus air-fluid levels. Bilateral posterior ethmoid sinus mucosal thickening.  CT MAXILLOFACIAL FINDINGS  The examination is limited by artifacts produced by uncontrollable mouth movements. No facial fractures or paranasal sinus air-fluid levels are seen.  CT CERVICAL SPINE FINDINGS  Multilevel degenerative changes. Mild dextroconvex scoliosis. No prevertebral soft tissue swelling, fractures or subluxations. Bilateral carotid artery calcifications. Biapical pleural and parenchymal scarring.  IMPRESSION: CT HEAD IMPRESSION  1. Multiple subdural hematomas and areas of right frontal cortical hemorrhage, as described above. 2. Right frontal scalp hematoma. 3. Atrophy and chronic small vessel white matter ischemic changes. 4. Chronic bilateral ethmoid sinusitis.  CT MAXILLOFACIAL IMPRESSION  Limited examination with no visible facial fractures or paranasal sinus air-fluid levels.  CT CERVICAL SPINE IMPRESSION  1. No fracture or subluxation. 2. Multilevel degenerative changes. 3. Bilateral carotid artery atheromatous calcifications. These results were called by telephone at the time of interpretation on 08/04/2013 at 3:03 p.m. to Dr. Vanetta Mulders , who verbally acknowledged these results.    Electronically Signed   By: Gordan Payment M.D.   On: 08/04/2013 15:03   Dg Chest Port 1 View  08/04/2013   CLINICAL DATA:  Fall, head injury, altered mental status, history coronary disease, hypertension, diabetes, renal failure  EXAM: PORTABLE CHEST - 1 VIEW  COMPARISON:  Portable exam at 1646 hr compared to 11/12/2009  FINDINGS: Normal heart size post CABG.  Mediastinal contours and pulmonary vascularity normal.  Biapical scarring.  Lungs otherwise clear.  No pleural effusion or pneumothorax.  Posttraumatic deformities of multiple left ribs, old and unchanged.  Diffuse osseous demineralization.  IMPRESSION: No acute abnormalities.   Electronically Signed   By: Ulyses Southward M.D.   On: 08/04/2013 17:03   Ct Maxillofacial Wo Cm  08/04/2013   CLINICAL DATA:  Right frontal soft tissue swelling following a fall. Unable to give history.  EXAM: CT HEAD WITHOUT CONTRAST  CT MAXILLOFACIAL WITHOUT CONTRAST  CT CERVICAL SPINE WITHOUT CONTRAST  TECHNIQUE: Multidetector CT imaging of the head, cervical spine, and maxillofacial structures were performed using the standard protocol without intravenous contrast. Multiplanar CT image reconstructions of the cervical spine and maxillofacial structures were also generated.  COMPARISON:  Previous examinations, the most recent dated 11/12/2009.  FINDINGS: CT HEAD FINDINGS  Bilateral parafalcine subdural hematomas, measuring 6 mm in thickness on the right and 4 mm in thickness on the left. There is also a small focal right frontal subdural hematoma measuring 8 mm in maximum thickness. There is also a small right lateral subdural hematoma with a maximum thickness of 4 mm. There are also small areas of cortical hemorrhage in the right frontal lobe anteriorly. No mass effect.  Diffusely enlarged ventricles and subarachnoid spaces. Patchy white matter low density in both cerebral hemispheres. Right frontal scalp hematoma. No skull fractures or paranasal sinus air-fluid levels. Bilateral  posterior ethmoid sinus mucosal thickening.  CT MAXILLOFACIAL FINDINGS  The examination is limited by artifacts produced by uncontrollable mouth movements. No facial fractures or paranasal sinus air-fluid levels are seen.  CT CERVICAL SPINE FINDINGS  Multilevel degenerative changes. Mild dextroconvex scoliosis. No prevertebral soft tissue swelling, fractures or subluxations. Bilateral carotid artery calcifications. Biapical pleural and parenchymal scarring.  IMPRESSION: CT HEAD IMPRESSION  1. Multiple subdural hematomas and areas of right frontal cortical hemorrhage, as described above. 2. Right frontal scalp hematoma. 3. Atrophy and chronic small vessel white matter ischemic changes. 4. Chronic bilateral ethmoid sinusitis.  CT MAXILLOFACIAL IMPRESSION  Limited examination with no visible facial fractures or paranasal sinus air-fluid levels.  CT CERVICAL SPINE IMPRESSION  1. No fracture or subluxation. 2. Multilevel degenerative changes. 3. Bilateral carotid artery atheromatous calcifications. These results were called by telephone at the time of interpretation on 08/04/2013 at 3:03 p.m. to Dr. Vanetta Mulders , who verbally acknowledged these results.   Electronically Signed   By: Gordan Payment M.D.   On: 08/04/2013 15:03     Assessment/Plan: Elderly male with a long-standing history of vascular dementia, as well as multiple other significant medical comorbidities. Apparently had a fall at a SNF and was brought to the emergency room for evaluation. CT scan shows multiple small areas of intracranial hemorrhage, without mass effect or shift. There is no indication for surgical intervention now, and it is certainly questionable whether surgery would be an appropriate intervention for this patient at any time.  [facility] for supportive care. I've advised no anticoagulants or antiplatelet agents. Patient needs to have a repeat CT of the brain without contrast on the  morning of Saturday, October 11.  All these recommendations were discussed with Shelda Jakes, MD.   Hewitt Shorts, MD 08/04/2013, 8:43 PM

## 2013-08-04 NOTE — ED Notes (Addendum)
Pt removed off LSB per EDP. c-collar remains in place.

## 2013-08-04 NOTE — ED Provider Notes (Signed)
CSN: [id number]     Arrival date & time 08/04/13  1240 History   First MD Initiated Contact with Patient 08/04/13 1242     Chief Complaint  Patient presents with  . Fall  . Head Injury   (Consider location/radiation/quality/duration/timing/severity/associated sxs/prior Treatment) The history is provided by the EMS personnel and the nursing home. The history is limited by the condition of the patient.   level V caveat applies to the history due to patient's severe dementia. Patient brought in by EMS from [facility] a nursing home staff there witnessed the patient fall he stood up from his wheelchair and fell straight forward. Patient with abrasion to his right for head and right elbow stated no loss of consciousness. He has a history of dementia significant dementia and is unable to be a historian.  Past Medical History  Diagnosis Date  . Anxiety state, unspecified   . Arthritis     fingers/shoulders  . Unspecified cataract   . Colon polyps     last colonoscopy 2010  . CAD (coronary artery disease) 2006    s/p CABG  . Senile dementia, uncomplicated   . Depression   . HLD (hyperlipidemia)   . Gastric ulcer, unspecified as acute or chronic, without mention of hemorrhage, perforation, or obstruction   . Hypertension   . Insomnia   . TIA (transient ischemic attack)   . Clostridium difficile infection 2012    recurrent-last treatment 10/2010  . BPH (benign prostatic hypertrophy)   . H/O: hypothyroidism   . History of chronic pancreatitis   . Alcohol abuse, in remission     sober x years  . Paroxysmal a-fib     history of  . Hx: UTI (urinary tract infection)   . Diabetes mellitus type II     history of  . ARF (acute renal failure)     history of  . History of chicken pox   . Pulmonary nodule/lesion, solitary 05/2010    4mm L lung base nodule, if high risk rec f/u with CT 1 yr, if low risk, no f/u needed  . Muscle weakness (generalized)   . Lack of coordination   . Psychosis    . Episodic mood disorder   . Extrapyramidal disorder   . Altered mental status    Past Surgical History  Procedure Laterality Date  . Coronary artery bypass graft  2006  . Injection knee  09/06/2001  . Cardiovascular stress test  05/2008    small regions of perfusion abnormality suggestive of diaphragm artifact, exercise cap 7 METs   Family History  Problem Relation Age of Onset  . Dementia Father     ? Alzheimers  . Heart attack Father   . Hypertension Brother   . Diabetes Brother    History  Substance Use Topics  . Smoking status: Former Smoker    Quit date: 10/27/2005  . Smokeless tobacco: Not on file  . Alcohol Use: No     Comment: Alcoholism-Sober-Quit x 3 years    Review of Systems  Unable to perform ROS  patient with significant dementia level V caveat applies the review of systems.  Allergies  Codeine  Home Medications   Current Outpatient Rx  Name  Route  Sig  Dispense  Refill  . ARIPiprazole (ABILIFY) 2 MG tablet   Oral   Take 2 mg by mouth daily.         Marland Kitchen aspirin 81 MG EC tablet   Oral   Take 81  mg by mouth daily.           Marland Kitchen atorvastatin (LIPITOR) 10 MG tablet   Oral   Take 10 mg by mouth daily.         . cholecalciferol (VITAMIN D) 400 UNITS TABS tablet   Oral   Take 400 Units by mouth daily.         . cyanocobalamin 1000 MCG tablet   Oral   Take 100 mcg by mouth daily.         Marland Kitchen HYDROcodone-acetaminophen (NORCO/VICODIN) 5-325 MG per tablet   Oral   Take 1 tablet by mouth 2 (two) times daily.         Marland Kitchen loratadine (CLARITIN) 10 MG tablet   Oral   Take 10 mg by mouth daily.         . Melatonin 3 MG TABS   Oral   Take 3 mg by mouth at bedtime.         . memantine (NAMENDA) 10 MG tablet   Oral   Take 10 mg by mouth 2 (two) times daily.         . Multiple Vitamin (MULTIVITAMIN) tablet   Oral   Take 1 tablet by mouth daily.           . Rivastigmine (EXELON) 13.3 MG/24HR PT24   Transdermal   Place 13.3 mg  onto the skin daily.         . sennosides-docusate sodium (SENOKOT-S) 8.6-50 MG tablet   Oral   Take 2 tablets by mouth daily.         . sertraline (ZOLOFT) 100 MG tablet   Oral   Take 100 mg by mouth daily.         . Tamsulosin HCl (FLOMAX) 0.4 MG CAPS   Oral   Take 0.4 mg by mouth daily.            BP 117/63  Pulse 78  Temp(Src) 97.6 F (36.4 C) (Axillary)  Resp 15  SpO2 90% Physical Exam  Nursing note and vitals reviewed. Constitutional: He is oriented to person, place, and time. He appears well-developed and well-nourished. No distress.  HENT:  Mouth/Throat: Oropharynx is clear and moist.  Patient with large right fore head abrasion and hematoma measuring about 7 cm x 3 patient with a 2 cm laceration to his right eyebrow.  Eyes: Conjunctivae and EOM are normal. Pupils are equal, round, and reactive to light.  Neck:  Cervical collar in place.  Cardiovascular: Normal rate and normal heart sounds.   Pulmonary/Chest: Effort normal and breath sounds normal. No respiratory distress.  Abdominal: Soft. Bowel sounds are normal. There is no tenderness.  Musculoskeletal: Normal range of motion.  Superficial abrasion to the right elbow with some swelling. Distally radial pulse in the right wrist is 2+.  Neurological: He is alert and oriented to person, place, and time. He exhibits normal muscle tone. Coordination normal.  Patient with significant dementia but moving all 4 extremities cranial nerves appear to be grossly intact.  Skin: Skin is warm. No rash noted.    ED Course  Procedures (including critical care time) Labs Review Labs Reviewed  COMPREHENSIVE METABOLIC PANEL - Abnormal; Notable for the following:    Alkaline Phosphatase 238 (*)    GFR calc non Af Amer 82 (*)    All other components within normal limits  CBC WITH DIFFERENTIAL  PROTIME-INR  PRO B NATRIURETIC PEPTIDE   Results for orders placed during the hospital  encounter of 08/04/13  CBC WITH  DIFFERENTIAL      Result Value Range   WBC 8.9  4.0 - 10.5 K/uL   RBC 4.62  4.22 - 5.81 MIL/uL   Hemoglobin 13.3  13.0 - 17.0 g/dL   HCT 98.1  19.1 - 47.8 %   MCV 86.4  78.0 - 100.0 fL   MCH 28.8  26.0 - 34.0 pg   MCHC 33.3  30.0 - 36.0 g/dL   RDW 29.5  62.1 - 30.8 %   Platelets 150  150 - 400 K/uL   Neutrophils Relative % 74  43 - 77 %   Neutro Abs 6.6  1.7 - 7.7 K/uL   Lymphocytes Relative 17  12 - 46 %   Lymphs Abs 1.5  0.7 - 4.0 K/uL   Monocytes Relative 9  3 - 12 %   Monocytes Absolute 0.8  0.1 - 1.0 K/uL   Eosinophils Relative 1  0 - 5 %   Eosinophils Absolute 0.1  0.0 - 0.7 K/uL   Basophils Relative 0  0 - 1 %   Basophils Absolute 0.0  0.0 - 0.1 K/uL  COMPREHENSIVE METABOLIC PANEL      Result Value Range   Sodium 145  135 - 145 mEq/L   Potassium 4.3  3.5 - 5.1 mEq/L   Chloride 107  96 - 112 mEq/L   CO2 28  19 - 32 mEq/L   Glucose, Bld 72  70 - 99 mg/dL   BUN 18  6 - 23 mg/dL   Creatinine, Ser 6.57  0.50 - 1.35 mg/dL   Calcium 9.4  8.4 - 84.6 mg/dL   Total Protein 7.4  6.0 - 8.3 g/dL   Albumin 4.0  3.5 - 5.2 g/dL   AST 16  0 - 37 U/L   ALT 10  0 - 53 U/L   Alkaline Phosphatase 238 (*) 39 - 117 U/L   Total Bilirubin 0.8  0.3 - 1.2 mg/dL   GFR calc non Af Amer 82 (*) >90 mL/min   GFR calc Af Amer >90  >90 mL/min  PROTIME-INR      Result Value Range   Prothrombin Time 13.3  11.6 - 15.2 seconds   INR 1.03  0.00 - 1.49  PRO B NATRIURETIC PEPTIDE      Result Value Range   Pro B Natriuretic peptide (BNP) 329.1  0 - 450 pg/mL    Imaging Review Dg Elbow Complete Right  08/04/2013   CLINICAL DATA:  Fall.  EXAM: RIGHT ELBOW - COMPLETE 3+ VIEW  COMPARISON:  None.  FINDINGS: Standard imaging was not able to be performed.  Degenerative changes without fracture or dislocation noted.  IMPRESSION: No fracture or dislocation is noted however, standard imaging was not able to be performed and therefore evaluation slightly limited.  Prominent olecranon spur.   Electronically  Signed   By: [name] M.D.   On: 08/04/2013 13:59   Ct Head Wo Contrast  08/04/2013   CLINICAL DATA:  Right frontal soft tissue swelling following a fall. Unable to give history.  EXAM: CT HEAD WITHOUT CONTRAST  CT MAXILLOFACIAL WITHOUT CONTRAST  CT CERVICAL SPINE WITHOUT CONTRAST  TECHNIQUE: Multidetector CT imaging of the head, cervical spine, and maxillofacial structures were performed using the standard protocol without intravenous contrast. Multiplanar CT image reconstructions of the cervical spine and maxillofacial structures were also generated.  COMPARISON:  Previous examinations, the most recent dated 11/12/2009.  FINDINGS: CT HEAD FINDINGS  Bilateral parafalcine subdural hematomas, measuring 6 mm in thickness on the right and 4 mm in thickness on the left. There is also a small focal right frontal subdural hematoma measuring 8 mm in maximum thickness. There is also a small right lateral subdural hematoma with a maximum thickness of 4 mm. There are also small areas of cortical hemorrhage in the right frontal lobe anteriorly. No mass effect. Diffusely enlarged ventricles and subarachnoid spaces. Patchy white matter low density in both cerebral hemispheres. Right frontal scalp hematoma. No skull fractures or paranasal sinus air-fluid levels. Bilateral posterior ethmoid sinus mucosal thickening.  CT MAXILLOFACIAL FINDINGS  The examination is limited by artifacts produced by uncontrollable mouth movements. No facial fractures or paranasal sinus air-fluid levels are seen.  CT CERVICAL SPINE FINDINGS  Multilevel degenerative changes. Mild dextroconvex scoliosis. No prevertebral soft tissue swelling, fractures or subluxations. Bilateral carotid artery calcifications. Biapical pleural and parenchymal scarring.  IMPRESSION: CT HEAD IMPRESSION  1. Multiple subdural hematomas and areas of right frontal cortical hemorrhage, as described above. 2. Right frontal scalp hematoma. 3. Atrophy and chronic small vessel  white matter ischemic changes. 4. Chronic bilateral ethmoid sinusitis.  CT MAXILLOFACIAL IMPRESSION  Limited examination with no visible facial fractures or paranasal sinus air-fluid levels.  CT CERVICAL SPINE IMPRESSION  1. No fracture or subluxation. 2. Multilevel degenerative changes. 3. Bilateral carotid artery atheromatous calcifications. These results were called by telephone at the time of interpretation on 08/04/2013 at 3:03 p.m. to Dr. Vanetta Mulders , who verbally acknowledged these results.   Electronically Signed   By: Gordan Payment M.D.   On: 08/04/2013 15:03   Ct Cervical Spine Wo Contrast  08/04/2013   CLINICAL DATA:  Right frontal soft tissue swelling following a fall. Unable to give history.  EXAM: CT HEAD WITHOUT CONTRAST  CT MAXILLOFACIAL WITHOUT CONTRAST  CT CERVICAL SPINE WITHOUT CONTRAST  TECHNIQUE: Multidetector CT imaging of the head, cervical spine, and maxillofacial structures were performed using the standard protocol without intravenous contrast. Multiplanar CT image reconstructions of the cervical spine and maxillofacial structures were also generated.  COMPARISON:  Previous examinations, the most recent dated 11/12/2009.  FINDINGS: CT HEAD FINDINGS  Bilateral parafalcine subdural hematomas, measuring 6 mm in thickness on the right and 4 mm in thickness on the left. There is also a small focal right frontal subdural hematoma measuring 8 mm in maximum thickness. There is also a small right lateral subdural hematoma with a maximum thickness of 4 mm. There are also small areas of cortical hemorrhage in the right frontal lobe anteriorly. No mass effect. Diffusely enlarged ventricles and subarachnoid spaces. Patchy white matter low density in both cerebral hemispheres. Right frontal scalp hematoma. No skull fractures or paranasal sinus air-fluid levels. Bilateral posterior ethmoid sinus mucosal thickening.  CT MAXILLOFACIAL FINDINGS  The examination is limited by artifacts produced by  uncontrollable mouth movements. No facial fractures or paranasal sinus air-fluid levels are seen.  CT CERVICAL SPINE FINDINGS  Multilevel degenerative changes. Mild dextroconvex scoliosis. No prevertebral soft tissue swelling, fractures or subluxations. Bilateral carotid artery calcifications. Biapical pleural and parenchymal scarring.  IMPRESSION: CT HEAD IMPRESSION  1. Multiple subdural hematomas and areas of right frontal cortical hemorrhage, as described above. 2. Right frontal scalp hematoma. 3. Atrophy and chronic small vessel white matter ischemic changes. 4. Chronic bilateral ethmoid sinusitis.  CT MAXILLOFACIAL IMPRESSION  Limited examination with no visible facial fractures or paranasal sinus air-fluid levels.  CT CERVICAL SPINE IMPRESSION  1. No fracture or subluxation. 2.  Multilevel degenerative changes. 3. Bilateral carotid artery atheromatous calcifications. These results were called by telephone at the time of interpretation on 08/04/2013 at 3:03 p.m. to Dr. Vanetta Mulders , who verbally acknowledged these results.   Electronically Signed   By: Gordan Payment M.D.   On: 08/04/2013 15:03   Dg Chest Port 1 View  08/04/2013   CLINICAL DATA:  Fall, head injury, altered mental status, history coronary disease, hypertension, diabetes, renal failure  EXAM: PORTABLE CHEST - 1 VIEW  COMPARISON:  Portable exam at 1646 hr compared to 11/12/2009  FINDINGS: Normal heart size post CABG.  Mediastinal contours and pulmonary vascularity normal.  Biapical scarring.  Lungs otherwise clear.  No pleural effusion or pneumothorax.  Posttraumatic deformities of multiple left ribs, old and unchanged.  Diffuse osseous demineralization.  IMPRESSION: No acute abnormalities.   Electronically Signed   By: Ulyses Southward M.D.   On: 08/04/2013 17:03   Ct Maxillofacial Wo Cm  08/04/2013   CLINICAL DATA:  Right frontal soft tissue swelling following a fall. Unable to give history.  EXAM: CT HEAD WITHOUT CONTRAST  CT MAXILLOFACIAL  WITHOUT CONTRAST  CT CERVICAL SPINE WITHOUT CONTRAST  TECHNIQUE: Multidetector CT imaging of the head, cervical spine, and maxillofacial structures were performed using the standard protocol without intravenous contrast. Multiplanar CT image reconstructions of the cervical spine and maxillofacial structures were also generated.  COMPARISON:  Previous examinations, the most recent dated 11/12/2009.  FINDINGS: CT HEAD FINDINGS  Bilateral parafalcine subdural hematomas, measuring 6 mm in thickness on the right and 4 mm in thickness on the left. There is also a small focal right frontal subdural hematoma measuring 8 mm in maximum thickness. There is also a small right lateral subdural hematoma with a maximum thickness of 4 mm. There are also small areas of cortical hemorrhage in the right frontal lobe anteriorly. No mass effect. Diffusely enlarged ventricles and subarachnoid spaces. Patchy white matter low density in both cerebral hemispheres. Right frontal scalp hematoma. No skull fractures or paranasal sinus air-fluid levels. Bilateral posterior ethmoid sinus mucosal thickening.  CT MAXILLOFACIAL FINDINGS  The examination is limited by artifacts produced by uncontrollable mouth movements. No facial fractures or paranasal sinus air-fluid levels are seen.  CT CERVICAL SPINE FINDINGS  Multilevel degenerative changes. Mild dextroconvex scoliosis. No prevertebral soft tissue swelling, fractures or subluxations. Bilateral carotid artery calcifications. Biapical pleural and parenchymal scarring.  IMPRESSION: CT HEAD IMPRESSION  1. Multiple subdural hematomas and areas of right frontal cortical hemorrhage, as described above. 2. Right frontal scalp hematoma. 3. Atrophy and chronic small vessel white matter ischemic changes. 4. Chronic bilateral ethmoid sinusitis.  CT MAXILLOFACIAL IMPRESSION  Limited examination with no visible facial fractures or paranasal sinus air-fluid levels.  CT CERVICAL SPINE IMPRESSION  1. No fracture  or subluxation. 2. Multilevel degenerative changes. 3. Bilateral carotid artery atheromatous calcifications. These results were called by telephone at the time of interpretation on 08/04/2013 at 3:03 p.m. to Dr. Vanetta Mulders , who verbally acknowledged these results.   Electronically Signed   By: Gordan Payment M.D.   On: 08/04/2013 15:03    EKG Interpretation   None      Date: 08/04/2013  Rate: 97  Rhythm: normal sinus rhythm  QRS Axis: normal  Intervals: normal  ST/T Wave abnormalities: nonspecific ST/T changes  Conduction Disutrbances:none  Narrative Interpretation:   Old EKG Reviewed: none available  Prolong QT interval paired ventricular premature complexes.  Patient's laceration to his right eyebrow area measuring about 2  cm was cleaned. Initially wasn't bleeding very much but then patient messed with it started to bleed so he cleaned it again and closed with Dermabond. Patient tolerated the procedure well no anesthesia used  MDM   1. Fall, initial encounter   2. Head injury, initial encounter   3. Subdural hematoma    Patient status post fall at [facility]. Resulting in head injury and subdural bleed. Patient will be admitted. Patient is followed by [facility] senior care. [facility] hospitalist will and that. Consulted the on-call neurosurgery Dr. Ezzard Standing. He reviewed a head CT. He recommends no anticoagulants and followup head CT on Saturday morning if negative can be discharged home. Patient with a laceration above his right eyebrow that was closed with Dermabond. Patient with an abrasion to his right elbow x-rays of the right elbow without any bony injuries. CT of face and neck without any bony injuries.    Shelda Jakes, MD 08/04/13 1945

## 2013-08-05 DIAGNOSIS — E43 Unspecified severe protein-calorie malnutrition: Secondary | ICD-10-CM

## 2013-08-05 DIAGNOSIS — F411 Generalized anxiety disorder: Secondary | ICD-10-CM

## 2013-08-05 HISTORY — DX: Unspecified severe protein-calorie malnutrition: E43

## 2013-08-05 LAB — CBC
MCHC: 34.1 g/dL (ref 30.0–36.0)
MCV: 85.3 fL (ref 78.0–100.0)
Platelets: 165 10*3/uL (ref 150–400)
RBC: 4.64 MIL/uL (ref 4.22–5.81)
RDW: 14.2 % (ref 11.5–15.5)
WBC: 10.3 10*3/uL (ref 4.0–10.5)

## 2013-08-05 LAB — BASIC METABOLIC PANEL
BUN: 16 mg/dL (ref 6–23)
CO2: 26 mEq/L (ref 19–32)
Calcium: 9.3 mg/dL (ref 8.4–10.5)
Chloride: 104 mEq/L (ref 96–112)
Creatinine, Ser: 0.96 mg/dL (ref 0.50–1.35)
GFR calc Af Amer: 89 mL/min — ABNORMAL LOW (ref >90)
Sodium: 141 mEq/L (ref 135–145)

## 2013-08-05 LAB — URINALYSIS, ROUTINE W REFLEX MICROSCOPIC
Glucose, UA: NEGATIVE mg/dL
Ketones, ur: NEGATIVE mg/dL
Leukocytes, UA: NEGATIVE
Nitrite: NEGATIVE
Urobilinogen, UA: 0.2 mg/dL (ref 0.0–1.0)
pH: 5 (ref 5.0–8.0)

## 2013-08-05 LAB — CLOSTRIDIUM DIFFICILE BY PCR: Toxigenic C. Difficile by PCR: NEGATIVE

## 2013-08-05 MED ORDER — HALOPERIDOL LACTATE 5 MG/ML IJ SOLN
1.0000 mg | Freq: Once | INTRAMUSCULAR | Status: AC
  Administered 2013-08-05: 1 mg via INTRAVENOUS
  Filled 2013-08-05: qty 1

## 2013-08-05 MED ORDER — ENSURE COMPLETE PO LIQD
237.0000 mL | Freq: Two times a day (BID) | ORAL | Status: DC
  Administered 2013-08-05 – 2013-08-08 (×3): 237 mL via ORAL

## 2013-08-05 MED ORDER — QUETIAPINE 12.5 MG HALF TABLET
12.5000 mg | ORAL_TABLET | Freq: Two times a day (BID) | ORAL | Status: DC | PRN
  Administered 2013-08-06 – 2013-08-07 (×3): 12.5 mg via ORAL
  Filled 2013-08-05 (×4): qty 1

## 2013-08-05 NOTE — Progress Notes (Signed)
Filed Vitals:   08/04/13 2113 08/05/13 0131 08/05/13 0531 08/05/13 1019  BP: 94/50 112/67 101/57 98/56  Pulse: 86 95 78 100  Temp: 99.1 F (37.3 C) 98.2 F (36.8 C) 99.1 F (37.3 C) 98.4 F (36.9 C)  TempSrc: Oral Oral Axillary Oral  Resp: 18 14 18 18   Height: 6\' 3"  (1.905 m)     Weight: 56.5 kg (124 lb 9 oz)     SpO2: 99%  98% 91%    CBC  Recent Labs  08/04/13 1640 08/05/13 0635  WBC 8.9 10.3  HGB 13.3 13.5  HCT 39.9 39.6  PLT 150 165   BMET  Recent Labs  08/04/13 1640 08/05/13 0635  NA 145 141  K 4.3 4.5  CL 107 104  CO2 28 26  GLUCOSE 72 84  BUN 18 16  CREATININE 0.81 0.96  CALCIUM 9.4 9.3    Patient resting in bed, much less restless than last night. Sitter with him thinks he may have been given some Haldol. No speech. Not following commands.  Plan: Profound dementia with minor head trauma. For repeat CT of the brain without contrast in a.m. Certainly code status should be addressed in the light of his baseline condition and the question regarding the appropriateness of his current code status.  Hewitt Shorts, MD 08/05/2013, 1:23 PM

## 2013-08-05 NOTE — Progress Notes (Addendum)
Pt had couple of nonsustained runs of VTach, frequent multifocal bigeminy and trigeminy PVCs, and tachycardia. Pt not showing signs of distress, pt not alert enough to answer symptomatic questions. Paged on call MD with information. Pt still not sleeping/resting from haldol dose, but less agitated and not trying as hard to get out of bed. Will continue to monitor.

## 2013-08-05 NOTE — Progress Notes (Signed)
PT Cancellation and Discharge Note  Patient Details Name: JEFTE CARITHERS MRN: [id number] DOB: Jul 02, 1932   Cancelled Treatment:    Reason Eval/Treat Not Completed: PT screened, no needs identified, will sign off.  Spoke with Nsg at [facility] who state pt is total care at baseline.  No acute PT needs at this time.  Will sign off and defer any therapy needs to SNF.     Sunny Schlein, [place] [phone] 08/05/2013, 8:59 AM

## 2013-08-05 NOTE — Clinical Social Work Note (Signed)
CSW unable to complete assessment at this time due to pt's daughter listed numbers being disconnected and CSW unable to contact pt's listed granddaughter. CSW will leave handoff for weekend CSW to continue to attempt to speak with family member regarding pt returning to SNF at time of medical discharge.  Darlyn Chamber, LCSWA 6N/3S Clinical Social Worker [phone]

## 2013-08-05 NOTE — Progress Notes (Signed)
Utilization review completed. Ashya Nicolaisen, RN, BSN. 

## 2013-08-05 NOTE — Clinical Documentation Improvement (Signed)
THIS DOCUMENT IS NOT A PERMANENT PART OF THE MEDICAL RECORD  Please update your documentation with the medical record to reflect your response to this query. If you need help knowing how to do this please call [phone].  08/05/13  Dear Dr. Vanessa Barbara,  In an effort to better capture your patient's severity of illness, reflect appropriate length of stay and utilization of resources, a review of the patient medical record has revealed the following indicators.   Based on your clinical judgment, please clarify and document in a progress note and/or discharge summary the clinical condition associated with the following supporting information: In responding to this query please exercise your independent judgment.  The fact that a query is asked, does not imply that any particular answer is desired or expected.   According to the documented Height and Weight in CHL/EPIC, the patients BMI is 15.6. If your clinical findings/judgment agrees with this,if possible could you please help clarify the suspected diagnosis in the progress note and discharge summary. THANK YOU!    [facility]: A diagnosis of UNDERWEIGHT or MORBID OBESITY should have the BMI documented along with it.  Possible Clinical Conditions?  - Underweight  - Other condition (please document in the progress notes and/or discharge summary)  - Cannot Clinically determine at this time       No additional documentation in chart upon review. SM   Thank You,  Saul Fordyce  Clinical Documentation Specialist: [phone] Health Information Management [place]

## 2013-08-05 NOTE — Progress Notes (Signed)
INITIAL NUTRITION ASSESSMENT  DOCUMENTATION CODES Per approved criteria  -Severe malnutrition in the context of chronic illness -Underweight   INTERVENTION:  Consider Speech Path consult for swallow evaluation  Ensure Complete twice daily (350 kcals, 13 gm protein per 8 fl oz bottle) RD to follow for nutrition care plan  NUTRITION DIAGNOSIS: Predicted suboptimal intake related to advanced dementia as evidenced by history & physical   Goal: Pt to meet >/= 90% of their estimated nutrition needs   Monitor:  PO & supplemental intake, weight, labs, I/O's  Reason for Assessment: Malnutrition Screening Tool Report  79 y.o. male  Admitting Dx: Subdural hematoma, post-traumatic  ASSESSMENT: Patient with PMH of significant dementia, HTN CAD and stroke; patient reportedly fell at the SNF and was brought to ED; CT scan of the brain showed significant generalized cerebral atrophy and a number of areas of small amounts of intracranial hemorrhage.  RD unable to obtain nutrition hx from patient; patient with mittens on; no family present at bedside; patient with visible severe muscle & subcutaneous fat loss to upper body; RD questions whether patient has dysphagia given health hx; would benefit from nutrition supplements -- RD to order Ensure Complete.  Patient meets criteria for severe malnutrition in the context of chronic illness as evidenced by severe muscle loss (clavicles, temples, acromion bone region) and severe subcutaneous fat loss (upper arm region).  Height: Ht Readings from Last 1 Encounters:  08/04/13 6\' 3"  (1.905 m)    Weight: Wt Readings from Last 1 Encounters:  08/04/13 124 lb 9 oz (56.5 kg)    Ideal Body Weight: 196 lb  % Ideal Body Weight: 63%  Wt Readings from Last 10 Encounters:  08/04/13 124 lb 9 oz (56.5 kg)  05/26/13 131 lb 12.8 oz (59.784 kg)  02/27/11 172 lb 0.6 oz (78.037 kg)  01/27/11 175 lb 1.3 oz (79.416 kg)  12/26/10 173 lb 12 oz (78.812 kg)     Usual Body Weight: 131 lb  % Usual Body Weight: 95%  BMI:  Body mass index is 15.57 kg/(m^2).  Estimated Nutritional Needs: Kcal: 1500-1700 Protein: 80-90 gm Fluid: 1.6-1.9 L  Skin: large abrasion to R eye  Diet Order: Cardiac  EDUCATION NEEDS: -No education needs identified at this time  Labs:   Recent Labs Lab 08/04/13 1640 08/05/13 0635  NA 145 141  K 4.3 4.5  CL 107 104  CO2 28 26  BUN 18 16  CREATININE 0.81 0.96  CALCIUM 9.4 9.3  GLUCOSE 72 84    Scheduled Meds: . ARIPiprazole  2 mg Oral Daily  . atorvastatin  10 mg Oral q1800  . memantine  10 mg Oral BID  . Rivastigmine  13.3 mg Transdermal Daily  . senna-docusate  2 tablet Oral QHS  . sertraline  100 mg Oral Daily  . sodium chloride  3 mL Intravenous Q12H  . tamsulosin  0.4 mg Oral Daily    Continuous Infusions:   Past Medical History  Diagnosis Date  . Anxiety state, unspecified   . Arthritis     fingers/shoulders  . Unspecified cataract   . Colon polyps     last colonoscopy 2010  . CAD (coronary artery disease) 2006    s/p CABG  . Senile dementia, uncomplicated   . Depression   . HLD (hyperlipidemia)   . Gastric ulcer, unspecified as acute or chronic, without mention of hemorrhage, perforation, or obstruction   . Hypertension   . Insomnia   . TIA (transient ischemic attack)   .  Clostridium difficile infection 2012    recurrent-last treatment 10/2010  . BPH (benign prostatic hypertrophy)   . H/O: hypothyroidism   . History of chronic pancreatitis   . Alcohol abuse, in remission     sober x years  . Paroxysmal a-fib     history of  . Hx: UTI (urinary tract infection)   . Diabetes mellitus type II     history of  . ARF (acute renal failure)     history of  . History of chicken pox   . Pulmonary nodule/lesion, solitary 05/2010    4mm L lung base nodule, if high risk rec f/u with CT 1 yr, if low risk, no f/u needed  . Muscle weakness (generalized)   . Lack of coordination   .  Psychosis   . Episodic mood disorder   . Extrapyramidal disorder   . Altered mental status     Past Surgical History  Procedure Laterality Date  . Coronary artery bypass graft  2006  . Injection knee  09/06/2001  . Cardiovascular stress test  05/2008    small regions of perfusion abnormality suggestive of diaphragm artifact, exercise cap 7 METs    Maureen Chatters, RD, LDN Pager #: [phone] After-Hours Pager #: [phone]

## 2013-08-05 NOTE — Progress Notes (Addendum)
[facility] PROGRESS NOTE  [name] JXB:[id number] DOB: Nov 12, 1931 DOA: 08/04/2013 PCP: [name], MD  Assessment/Plan: 1. Subdural hematoma. Patient with history of dementia, having a fall at his nursing home. Initial CT revealed multiple subdural hematomas per radiology report. Dr. Newell [name] of neurosurgery was consulted, did not recommend surgical intervention at this time with continuing conservative management. Plan to repeat a CT scan of brain without contrast tomorrow morning. Patient otherwise appears stable, moving all extremities, hemodynamically stable. 2. Status post fall. Patient with history of advanced dementia having a fall at his nursing home. At baseline, he requires assistance with activities of daily living. 3. Coronary artery disease. Patient with history of established coronary artery disease, antiplatelet therapy has been discontinued due to subdural hematoma. 4. Atrial fibrillation. EKG showed A. fib, patient not candidate for anticoagulation given recurrent falls and subdural hematoma. He remains rate controlled. 5. Advanced dementia. Patient with poor functional status at baseline. On Namenda and exalon. 6. DVT prophylaxis. SCDs  Code Status: Full code Disposition Plan: Plan to repeat CT scan of brain without contrast tomorrow morning.   Consultants:  Neurosurgery   HPI/Subjective: Overnight patient becoming agitated, required when necessary Haldol. On my examination this morning patient is sedated however was arousable. Not following commands.  Objective: Filed Vitals:   08/05/13 0531  BP: 101/57  Pulse: 78  Temp: 99.1 F (37.3 C)  Resp: 18   No intake or output data in the 24 hours ending 08/05/13 1000 Filed Weights   08/04/13 2113  Weight: 56.5 kg (124 lb 9 oz)    Exam:   General:  Patient is sedated however arousable. Demented, confused disoriented, not following commands.  Cardiovascular: Regular rate rhythm normal  S1-S2  Respiratory: Lungs are clear to auscultation bilaterally no wheezing rhonchi or rales  Abdomen: Soft nontender nondistended positive bowel sound  Musculoskeletal: Present range of motion to all extremities  Neurological: Patient moving all extremities, difficult to perform adequate neurologic examination given underlying dementia and sedation.   Data Reviewed: Basic Metabolic Panel:  Recent Labs Lab 08/04/13 1640 08/05/13 0635  NA 145 141  K 4.3 4.5  CL 107 104  CO2 28 26  GLUCOSE 72 84  BUN 18 16  CREATININE 0.81 0.96  CALCIUM 9.4 9.3   Liver Function Tests:  Recent Labs Lab 08/04/13 1640  AST 16  ALT 10  ALKPHOS 238*  BILITOT 0.8  PROT 7.4  ALBUMIN 4.0   No results found for this basename: LIPASE, AMYLASE,  in the last 168 hours No results found for this basename: AMMONIA,  in the last 168 hours CBC:  Recent Labs Lab 08/04/13 1640 08/05/13 0635  WBC 8.9 10.3  NEUTROABS 6.6  --   HGB 13.3 13.5  HCT 39.9 39.6  MCV 86.4 85.3  PLT 150 165   Cardiac Enzymes: No results found for this basename: CKTOTAL, CKMB, CKMBINDEX, TROPONINI,  in the last 168 hours BNP (last 3 results)  Recent Labs  08/04/13 1640  PROBNP 329.1   CBG: No results found for this basename: GLUCAP,  in the last 168 hours  No results found for this or any previous visit (from the past 240 hour(s)).   Studies: Dg Elbow Complete Right  08/04/2013   CLINICAL DATA:  Fall.  EXAM: RIGHT ELBOW - COMPLETE 3+ VIEW  COMPARISON:  None.  FINDINGS: Standard imaging was not able to be performed.  Degenerative changes without fracture or dislocation noted.  IMPRESSION: No fracture or dislocation is noted  however, standard imaging was not able to be performed and therefore evaluation slightly limited.  Prominent olecranon spur.   Electronically Signed   By: [name] M.D.   On: 08/04/2013 13:59   Ct Head Wo Contrast  08/04/2013   CLINICAL DATA:  Right frontal soft tissue swelling following  a fall. Unable to give history.  EXAM: CT HEAD WITHOUT CONTRAST  CT MAXILLOFACIAL WITHOUT CONTRAST  CT CERVICAL SPINE WITHOUT CONTRAST  TECHNIQUE: Multidetector CT imaging of the head, cervical spine, and maxillofacial structures were performed using the standard protocol without intravenous contrast. Multiplanar CT image reconstructions of the cervical spine and maxillofacial structures were also generated.  COMPARISON:  Previous examinations, the most recent dated 11/12/2009.  FINDINGS: CT HEAD FINDINGS  Bilateral parafalcine subdural hematomas, measuring 6 mm in thickness on the right and 4 mm in thickness on the left. There is also a small focal right frontal subdural hematoma measuring 8 mm in maximum thickness. There is also a small right lateral subdural hematoma with a maximum thickness of 4 mm. There are also small areas of cortical hemorrhage in the right frontal lobe anteriorly. No mass effect. Diffusely enlarged ventricles and subarachnoid spaces. Patchy white matter low density in both cerebral hemispheres. Right frontal scalp hematoma. No skull fractures or paranasal sinus air-fluid levels. Bilateral posterior ethmoid sinus mucosal thickening.  CT MAXILLOFACIAL FINDINGS  The examination is limited by artifacts produced by uncontrollable mouth movements. No facial fractures or paranasal sinus air-fluid levels are seen.  CT CERVICAL SPINE FINDINGS  Multilevel degenerative changes. Mild dextroconvex scoliosis. No prevertebral soft tissue swelling, fractures or subluxations. Bilateral carotid artery calcifications. Biapical pleural and parenchymal scarring.  IMPRESSION: CT HEAD IMPRESSION  1. Multiple subdural hematomas and areas of right frontal cortical hemorrhage, as described above. 2. Right frontal scalp hematoma. 3. Atrophy and chronic small vessel white matter ischemic changes. 4. Chronic bilateral ethmoid sinusitis.  CT MAXILLOFACIAL IMPRESSION  Limited examination with no visible facial fractures  or paranasal sinus air-fluid levels.  CT CERVICAL SPINE IMPRESSION  1. No fracture or subluxation. 2. Multilevel degenerative changes. 3. Bilateral carotid artery atheromatous calcifications. These results were called by telephone at the time of interpretation on 08/04/2013 at 3:03 p.m. to Dr. Vanetta Mulders , who verbally acknowledged these results.   Electronically Signed   By: Gordan Payment M.D.   On: 08/04/2013 15:03   Ct Cervical Spine Wo Contrast  08/04/2013   CLINICAL DATA:  Right frontal soft tissue swelling following a fall. Unable to give history.  EXAM: CT HEAD WITHOUT CONTRAST  CT MAXILLOFACIAL WITHOUT CONTRAST  CT CERVICAL SPINE WITHOUT CONTRAST  TECHNIQUE: Multidetector CT imaging of the head, cervical spine, and maxillofacial structures were performed using the standard protocol without intravenous contrast. Multiplanar CT image reconstructions of the cervical spine and maxillofacial structures were also generated.  COMPARISON:  Previous examinations, the most recent dated 11/12/2009.  FINDINGS: CT HEAD FINDINGS  Bilateral parafalcine subdural hematomas, measuring 6 mm in thickness on the right and 4 mm in thickness on the left. There is also a small focal right frontal subdural hematoma measuring 8 mm in maximum thickness. There is also a small right lateral subdural hematoma with a maximum thickness of 4 mm. There are also small areas of cortical hemorrhage in the right frontal lobe anteriorly. No mass effect. Diffusely enlarged ventricles and subarachnoid spaces. Patchy white matter low density in both cerebral hemispheres. Right frontal scalp hematoma. No skull fractures or paranasal sinus air-fluid levels. Bilateral posterior  ethmoid sinus mucosal thickening.  CT MAXILLOFACIAL FINDINGS  The examination is limited by artifacts produced by uncontrollable mouth movements. No facial fractures or paranasal sinus air-fluid levels are seen.  CT CERVICAL SPINE FINDINGS  Multilevel degenerative changes.  Mild dextroconvex scoliosis. No prevertebral soft tissue swelling, fractures or subluxations. Bilateral carotid artery calcifications. Biapical pleural and parenchymal scarring.  IMPRESSION: CT HEAD IMPRESSION  1. Multiple subdural hematomas and areas of right frontal cortical hemorrhage, as described above. 2. Right frontal scalp hematoma. 3. Atrophy and chronic small vessel white matter ischemic changes. 4. Chronic bilateral ethmoid sinusitis.  CT MAXILLOFACIAL IMPRESSION  Limited examination with no visible facial fractures or paranasal sinus air-fluid levels.  CT CERVICAL SPINE IMPRESSION  1. No fracture or subluxation. 2. Multilevel degenerative changes. 3. Bilateral carotid artery atheromatous calcifications. These results were called by telephone at the time of interpretation on 08/04/2013 at 3:03 p.m. to Dr. Vanetta Mulders , who verbally acknowledged these results.   Electronically Signed   By: Gordan Payment M.D.   On: 08/04/2013 15:03   Dg Chest Port 1 View  08/04/2013   CLINICAL DATA:  Fall, head injury, altered mental status, history coronary disease, hypertension, diabetes, renal failure  EXAM: PORTABLE CHEST - 1 VIEW  COMPARISON:  Portable exam at 1646 hr compared to 11/12/2009  FINDINGS: Normal heart size post CABG.  Mediastinal contours and pulmonary vascularity normal.  Biapical scarring.  Lungs otherwise clear.  No pleural effusion or pneumothorax.  Posttraumatic deformities of multiple left ribs, old and unchanged.  Diffuse osseous demineralization.  IMPRESSION: No acute abnormalities.   Electronically Signed   By: Ulyses Southward M.D.   On: 08/04/2013 17:03   Ct Maxillofacial Wo Cm  08/04/2013   CLINICAL DATA:  Right frontal soft tissue swelling following a fall. Unable to give history.  EXAM: CT HEAD WITHOUT CONTRAST  CT MAXILLOFACIAL WITHOUT CONTRAST  CT CERVICAL SPINE WITHOUT CONTRAST  TECHNIQUE: Multidetector CT imaging of the head, cervical spine, and maxillofacial structures were performed  using the standard protocol without intravenous contrast. Multiplanar CT image reconstructions of the cervical spine and maxillofacial structures were also generated.  COMPARISON:  Previous examinations, the most recent dated 11/12/2009.  FINDINGS: CT HEAD FINDINGS  Bilateral parafalcine subdural hematomas, measuring 6 mm in thickness on the right and 4 mm in thickness on the left. There is also a small focal right frontal subdural hematoma measuring 8 mm in maximum thickness. There is also a small right lateral subdural hematoma with a maximum thickness of 4 mm. There are also small areas of cortical hemorrhage in the right frontal lobe anteriorly. No mass effect. Diffusely enlarged ventricles and subarachnoid spaces. Patchy white matter low density in both cerebral hemispheres. Right frontal scalp hematoma. No skull fractures or paranasal sinus air-fluid levels. Bilateral posterior ethmoid sinus mucosal thickening.  CT MAXILLOFACIAL FINDINGS  The examination is limited by artifacts produced by uncontrollable mouth movements. No facial fractures or paranasal sinus air-fluid levels are seen.  CT CERVICAL SPINE FINDINGS  Multilevel degenerative changes. Mild dextroconvex scoliosis. No prevertebral soft tissue swelling, fractures or subluxations. Bilateral carotid artery calcifications. Biapical pleural and parenchymal scarring.  IMPRESSION: CT HEAD IMPRESSION  1. Multiple subdural hematomas and areas of right frontal cortical hemorrhage, as described above. 2. Right frontal scalp hematoma. 3. Atrophy and chronic small vessel white matter ischemic changes. 4. Chronic bilateral ethmoid sinusitis.  CT MAXILLOFACIAL IMPRESSION  Limited examination with no visible facial fractures or paranasal sinus air-fluid levels.  CT CERVICAL SPINE  IMPRESSION  1. No fracture or subluxation. 2. Multilevel degenerative changes. 3. Bilateral carotid artery atheromatous calcifications. These results were called by telephone at the time of  interpretation on 08/04/2013 at 3:03 p.m. to Dr. Vanetta Mulders , who verbally acknowledged these results.   Electronically Signed   By: Gordan Payment M.D.   On: 08/04/2013 15:03    Scheduled Meds: . ARIPiprazole  2 mg Oral Daily  . atorvastatin  10 mg Oral q1800  . memantine  10 mg Oral BID  . Rivastigmine  13.3 mg Transdermal Daily  . senna-docusate  2 tablet Oral QHS  . sertraline  100 mg Oral Daily  . sodium chloride  3 mL Intravenous Q12H  . tamsulosin  0.4 mg Oral Daily   Continuous Infusions:   Principal Problem:   Subdural hematoma, post-traumatic Active Problems:   CORONARY, ARTERIOSCLEROSIS   HYPERTENSION, ESSENTIAL   Dementia   Fall at nursing home    Time spent: 35 minutes    Jeralyn Bennett  [facility] Pager [phone]. If 7PM-7AM, please contact night-coverage at [email or web address], password [email or web address] 08/05/2013, 10:00 AM  LOS: 1 day     ADDENDUM:  Patient likely to have Protein Calorie Malnutrition. He has a BMI of 15.6, in setting of advanced dementia. Nutrition consult.

## 2013-08-06 ENCOUNTER — Inpatient Hospital Stay (HOSPITAL_COMMUNITY): Payer: Medicare Other

## 2013-08-06 DIAGNOSIS — E43 Unspecified severe protein-calorie malnutrition: Secondary | ICD-10-CM

## 2013-08-06 DIAGNOSIS — Z5189 Encounter for other specified aftercare: Secondary | ICD-10-CM

## 2013-08-06 DIAGNOSIS — F0391 Unspecified dementia with behavioral disturbance: Secondary | ICD-10-CM

## 2013-08-06 LAB — CBC
Hemoglobin: 13.7 g/dL (ref 13.0–17.0)
MCH: 30.2 pg (ref 26.0–34.0)
MCHC: 35.9 g/dL (ref 30.0–36.0)
Platelets: 142 10*3/uL — ABNORMAL LOW (ref 150–400)
RBC: 4.54 MIL/uL (ref 4.22–5.81)
RDW: 14.1 % (ref 11.5–15.5)

## 2013-08-06 MED ORDER — TAMSULOSIN HCL 0.4 MG PO CAPS: 0.4000 mg | ORAL_CAPSULE | Freq: Every day | ORAL | Status: DC

## 2013-08-06 MED ORDER — ENSURE COMPLETE PO LIQD: 237.0000 mL | Freq: Three times a day (TID) | ORAL | Status: DC

## 2013-08-06 NOTE — Progress Notes (Signed)
Clinical Social Worker (CSW) spoke to MD who reported that patient is medically ready for D/C back to [facility] today. CSW contacted [facility] and spoke with Eber Jones the weekend supervisor who reported that they could not take the patient back today and Monday would be better. Nursing is aware of above.   Jetta Lout, LCSWA Weekend CSW [phone]

## 2013-08-06 NOTE — Discharge Summary (Addendum)
Physician Discharge Summary  [name]:[id number] DOB: 11/29/1931 DOA: 08/04/2013  PCP: [name], MD  Admit date: 08/04/2013 Discharge date: 08/06/2013  Time spent: 35 minutes  Recommendations for Outpatient Follow-up:   Patient stable from minor head injury. Please followup in within week.   Discharge Diagnoses:  Principal Problem:   Subdural hematoma, post-traumatic Active Problems:   CORONARY, ARTERIOSCLEROSIS   HYPERTENSION, ESSENTIAL   Dementia   Fall at nursing home   Protein-calorie malnutrition, severe   Discharge Condition: Stable  Diet recommendation: Heart healthy diet, protein boost 3 times a day with meals  Filed Weights   08/04/13 2113  Weight: 56.5 kg (124 lb 9 oz)    History of present illness:  [name] is a 79 y.o. male with multiple comorbidities including advanced dementia, coronary artery disease, status post coronary artery bypass grafting who had been on aspirin therapy, having a fall at his skilled nursing facility. History was obtained from emergency room physician as patient unable to provide a reliable history or participate in his own plan of care given advanced dementia. Patient was in his usual state of health today, stood up from his wheelchair falling forward and striking right frontal region against the floor. Per nursing home staff, patient did not complain of any symptoms prior to the fall. There was no loss of consciousness. In the emergency room he was found to have abrasion to the right for head and right elbow. A CT scan of brain however revealed the presence of multiple subdural hematomas and areas of right frontal cortical hemorrhage. Dr. Newell [name] of neurosurgery was consulted by ED physician. Surgical intervention was not recommended at this time. Dr Newell [name] recommended patient be admitted with a repeat CT scan of brain to be scheduled on Saturday morning. Antiplatelet therapy to be discontinued.    Hospital Course:   Patient was admitted to medicine for observation. Initial CT scan of brain performed on admission showed multiple subdural hematomas and areas of right frontal cortical hemorrhage. He was seen and evaluated by Dr. Newell [name] of neurosurgery who recommended repeating a head CT in 2 days. During this hospitalization, he had a stable neurologic examination, without focal deficits noted. He did become agitated, and was administered when necessary antipsychotic therapy. Patient having a history of advanced dementia with behavioral changes. A repeat CT scan of head performed on 08/06/2013 showed areas of parenchymal hemorrhage have been interval decrease. He was evaluated by neurosurgery on 08/06/2013, given overall improvement to head CT with clinical stability, there is no indication for neurosurgical intervention or neurosurgical followup.  Of note despite making several attempts to get a hold of his daughter, Ebbie Madden ([phone]) to update her on patient's condition and to discuss goals of care, I could not get a hold of her. I called his facility and this was the only contact they had on file.   I evaluated patient on 08/08/2013, he remains agitated, confused disoriented. Patient with history of advanced dementia. There were no significant events overnight, he has remained stable, nonfocal neurologic examination, with lesser vitals were blood pressure 1 40-1/7 pulse of 60 temperature of 97.8. Patient at baseline requires assistance with all activities of daily living given advanced dementia. Plan to discharge him to skilled nursing facility today as he was unable to go over the weekend  Consultations:  Neurosurgery  Discharge Exam: Filed Vitals:   08/06/13 0611  BP: 101/85  Pulse: 74  Temp: 98 F (36.7 C)  Resp: 18  General: Patient is confused, disoriented, not responding appropriately to my questions. Cardiovascular: Regular rate and rhythm normal S1-S2 Respiratory: Coarse  respiratory sounds, normal respiratory effort Abdomen: Soft nontender nondistended positive bowel sounds Extremities: Muscle atrophy noted Neurological: Patient appears to have a nonfocal neurologic examination as he moves all extremities. Examination was limited by patient's inability to follow commands  Discharge Instructions  Discharge Orders   Future Orders Complete By Expires   Call MD for:  difficulty breathing, headache or visual disturbances  As directed    Call MD for:  persistant dizziness or light-headedness  As directed    Call MD for:  persistant nausea and vomiting  As directed    Call MD for:  severe uncontrolled pain  As directed    Call MD for:  temperature >100.4  As directed    Diet - low sodium heart healthy  As directed    Increase activity slowly  As directed        Medication List    STOP taking these medications       aspirin 81 MG EC tablet      TAKE these medications       ARIPiprazole 2 MG tablet  Commonly known as:  ABILIFY  Take 2 mg by mouth daily.     atorvastatin 10 MG tablet  Commonly known as:  LIPITOR  Take 10 mg by mouth daily.     cholecalciferol 400 UNITS Tabs tablet  Commonly known as:  VITAMIN D  Take 400 Units by mouth daily.     cyanocobalamin 1000 MCG tablet  Take 100 mcg by mouth daily.     EXELON 13.3 MG/24HR Pt24  Generic drug:  Rivastigmine  Place 13.3 mg onto the skin daily.     feeding supplement (ENSURE COMPLETE) Liqd  Take 237 mLs by mouth 3 (three) times daily between meals.     HYDROcodone-acetaminophen 5-325 MG per tablet  Commonly known as:  NORCO/VICODIN  Take 1 tablet by mouth 2 (two) times daily.     loratadine 10 MG tablet  Commonly known as:  CLARITIN  Take 10 mg by mouth daily.     Melatonin 3 MG Tabs  Take 3 mg by mouth at bedtime.     memantine 10 MG tablet  Commonly known as:  NAMENDA  Take 10 mg by mouth 2 (two) times daily.     multivitamin tablet  Take 1 tablet by mouth daily.      sennosides-docusate sodium 8.6-50 MG tablet  Commonly known as:  SENOKOT-S  Take 2 tablets by mouth daily.     sertraline 100 MG tablet  Commonly known as:  ZOLOFT  Take 100 mg by mouth daily.     tamsulosin 0.4 MG Caps capsule  Commonly known as:  FLOMAX  Take 1 capsule (0.4 mg total) by mouth daily.       Allergies  Allergen Reactions  . Codeine     REACTION: Rash,itching       Follow-up Information   Follow up with [name], MD.   Specialty:  [facility]   Contact information:   [address] [place] [address] [phone]        The results of significant diagnostics from this hospitalization (including imaging, microbiology, ancillary and laboratory) are listed below for reference.    Significant Diagnostic Studies: Dg Elbow Complete Right  08/04/2013   CLINICAL DATA:  Fall.  EXAM: RIGHT ELBOW - COMPLETE 3+ VIEW  COMPARISON:  None.  FINDINGS: Standard imaging was not able to be performed.  Degenerative changes without fracture or dislocation noted.  IMPRESSION: No fracture or dislocation is noted however, standard imaging was not able to be performed and therefore evaluation slightly limited.  Prominent olecranon spur.   Electronically Signed   By: [name] M.D.   On: 08/04/2013 13:59   Ct Head Wo Contrast  08/06/2013   CLINICAL DATA:  Subdural hematoma, laceration right frontal area  EXAM: CT HEAD WITHOUT CONTRAST  TECHNIQUE: Contiguous axial images were obtained from the base of the skull through the vertex without intravenous contrast.  COMPARISON:  08/04/2013  FINDINGS: There is acute subdural hematoma in the posterior falx and posteriorly in the bilateral occipital regions. Subdural hematoma also layers over the right tentorium.Subdural hematoma over the right parieto-occipital region measures 5 mm. Subdural hematoma over the left occipital region measures 4 mm. Bilateral parafalcine hemorrhages are less apparent than they were  previously. Right lateral subdural collection is also less apparent. Right frontal lobe areas of petechial hemorrhage are again identified, less prominent when compared to the prior study. There is no evidence of midline shift. There is diffuse atrophy with proportional dilatation of the ventricles. There is mild ethmoid sinus opacification bilaterally. No skull fracture is identified.  IMPRESSION: Small bilateral subdural hematomas. Areas of parenchymal hemorrhage show interval decrease in conspicuity and several scattered foci of isolated subdural hematoma are also decreased in conspicuity.   Electronically Signed   By: Esperanza Heir M.D.   On: 08/06/2013 09:26   Ct Head Wo Contrast  08/04/2013   CLINICAL DATA:  Right frontal soft tissue swelling following a fall. Unable to give history.  EXAM: CT HEAD WITHOUT CONTRAST  CT MAXILLOFACIAL WITHOUT CONTRAST  CT CERVICAL SPINE WITHOUT CONTRAST  TECHNIQUE: Multidetector CT imaging of the head, cervical spine, and maxillofacial structures were performed using the standard protocol without intravenous contrast. Multiplanar CT image reconstructions of the cervical spine and maxillofacial structures were also generated.  COMPARISON:  Previous examinations, the most recent dated 11/12/2009.  FINDINGS: CT HEAD FINDINGS  Bilateral parafalcine subdural hematomas, measuring 6 mm in thickness on the right and 4 mm in thickness on the left. There is also a small focal right frontal subdural hematoma measuring 8 mm in maximum thickness. There is also a small right lateral subdural hematoma with a maximum thickness of 4 mm. There are also small areas of cortical hemorrhage in the right frontal lobe anteriorly. No mass effect. Diffusely enlarged ventricles and subarachnoid spaces. Patchy white matter low density in both cerebral hemispheres. Right frontal scalp hematoma. No skull fractures or paranasal sinus air-fluid levels. Bilateral posterior ethmoid sinus mucosal thickening.   CT MAXILLOFACIAL FINDINGS  The examination is limited by artifacts produced by uncontrollable mouth movements. No facial fractures or paranasal sinus air-fluid levels are seen.  CT CERVICAL SPINE FINDINGS  Multilevel degenerative changes. Mild dextroconvex scoliosis. No prevertebral soft tissue swelling, fractures or subluxations. Bilateral carotid artery calcifications. Biapical pleural and parenchymal scarring.  IMPRESSION: CT HEAD IMPRESSION  1. Multiple subdural hematomas and areas of right frontal cortical hemorrhage, as described above. 2. Right frontal scalp hematoma. 3. Atrophy and chronic small vessel white matter ischemic changes. 4. Chronic bilateral ethmoid sinusitis.  CT MAXILLOFACIAL IMPRESSION  Limited examination with no visible facial fractures or paranasal sinus air-fluid levels.  CT CERVICAL SPINE IMPRESSION  1. No fracture or subluxation. 2. Multilevel degenerative changes. 3. Bilateral carotid artery atheromatous calcifications. These results were called by telephone  at the time of interpretation on 08/04/2013 at 3:03 p.m. to Dr. Vanetta Mulders , who verbally acknowledged these results.   Electronically Signed   By: Gordan Payment M.D.   On: 08/04/2013 15:03   Ct Cervical Spine Wo Contrast  08/04/2013   CLINICAL DATA:  Right frontal soft tissue swelling following a fall. Unable to give history.  EXAM: CT HEAD WITHOUT CONTRAST  CT MAXILLOFACIAL WITHOUT CONTRAST  CT CERVICAL SPINE WITHOUT CONTRAST  TECHNIQUE: Multidetector CT imaging of the head, cervical spine, and maxillofacial structures were performed using the standard protocol without intravenous contrast. Multiplanar CT image reconstructions of the cervical spine and maxillofacial structures were also generated.  COMPARISON:  Previous examinations, the most recent dated 11/12/2009.  FINDINGS: CT HEAD FINDINGS  Bilateral parafalcine subdural hematomas, measuring 6 mm in thickness on the right and 4 mm in thickness on the left. There is also  a small focal right frontal subdural hematoma measuring 8 mm in maximum thickness. There is also a small right lateral subdural hematoma with a maximum thickness of 4 mm. There are also small areas of cortical hemorrhage in the right frontal lobe anteriorly. No mass effect. Diffusely enlarged ventricles and subarachnoid spaces. Patchy white matter low density in both cerebral hemispheres. Right frontal scalp hematoma. No skull fractures or paranasal sinus air-fluid levels. Bilateral posterior ethmoid sinus mucosal thickening.  CT MAXILLOFACIAL FINDINGS  The examination is limited by artifacts produced by uncontrollable mouth movements. No facial fractures or paranasal sinus air-fluid levels are seen.  CT CERVICAL SPINE FINDINGS  Multilevel degenerative changes. Mild dextroconvex scoliosis. No prevertebral soft tissue swelling, fractures or subluxations. Bilateral carotid artery calcifications. Biapical pleural and parenchymal scarring.  IMPRESSION: CT HEAD IMPRESSION  1. Multiple subdural hematomas and areas of right frontal cortical hemorrhage, as described above. 2. Right frontal scalp hematoma. 3. Atrophy and chronic small vessel white matter ischemic changes. 4. Chronic bilateral ethmoid sinusitis.  CT MAXILLOFACIAL IMPRESSION  Limited examination with no visible facial fractures or paranasal sinus air-fluid levels.  CT CERVICAL SPINE IMPRESSION  1. No fracture or subluxation. 2. Multilevel degenerative changes. 3. Bilateral carotid artery atheromatous calcifications. These results were called by telephone at the time of interpretation on 08/04/2013 at 3:03 p.m. to Dr. Vanetta Mulders , who verbally acknowledged these results.   Electronically Signed   By: Gordan Payment M.D.   On: 08/04/2013 15:03   Dg Chest Port 1 View  08/04/2013   CLINICAL DATA:  Fall, head injury, altered mental status, history coronary disease, hypertension, diabetes, renal failure  EXAM: PORTABLE CHEST - 1 VIEW  COMPARISON:  Portable exam  at 1646 hr compared to 11/12/2009  FINDINGS: Normal heart size post CABG.  Mediastinal contours and pulmonary vascularity normal.  Biapical scarring.  Lungs otherwise clear.  No pleural effusion or pneumothorax.  Posttraumatic deformities of multiple left ribs, old and unchanged.  Diffuse osseous demineralization.  IMPRESSION: No acute abnormalities.   Electronically Signed   By: Ulyses Southward M.D.   On: 08/04/2013 17:03   Ct Maxillofacial Wo Cm  08/04/2013   CLINICAL DATA:  Right frontal soft tissue swelling following a fall. Unable to give history.  EXAM: CT HEAD WITHOUT CONTRAST  CT MAXILLOFACIAL WITHOUT CONTRAST  CT CERVICAL SPINE WITHOUT CONTRAST  TECHNIQUE: Multidetector CT imaging of the head, cervical spine, and maxillofacial structures were performed using the standard protocol without intravenous contrast. Multiplanar CT image reconstructions of the cervical spine and maxillofacial structures were also generated.  COMPARISON:  Previous examinations,  the most recent dated 11/12/2009.  FINDINGS: CT HEAD FINDINGS  Bilateral parafalcine subdural hematomas, measuring 6 mm in thickness on the right and 4 mm in thickness on the left. There is also a small focal right frontal subdural hematoma measuring 8 mm in maximum thickness. There is also a small right lateral subdural hematoma with a maximum thickness of 4 mm. There are also small areas of cortical hemorrhage in the right frontal lobe anteriorly. No mass effect. Diffusely enlarged ventricles and subarachnoid spaces. Patchy white matter low density in both cerebral hemispheres. Right frontal scalp hematoma. No skull fractures or paranasal sinus air-fluid levels. Bilateral posterior ethmoid sinus mucosal thickening.  CT MAXILLOFACIAL FINDINGS  The examination is limited by artifacts produced by uncontrollable mouth movements. No facial fractures or paranasal sinus air-fluid levels are seen.  CT CERVICAL SPINE FINDINGS  Multilevel degenerative changes. Mild  dextroconvex scoliosis. No prevertebral soft tissue swelling, fractures or subluxations. Bilateral carotid artery calcifications. Biapical pleural and parenchymal scarring.  IMPRESSION: CT HEAD IMPRESSION  1. Multiple subdural hematomas and areas of right frontal cortical hemorrhage, as described above. 2. Right frontal scalp hematoma. 3. Atrophy and chronic small vessel white matter ischemic changes. 4. Chronic bilateral ethmoid sinusitis.  CT MAXILLOFACIAL IMPRESSION  Limited examination with no visible facial fractures or paranasal sinus air-fluid levels.  CT CERVICAL SPINE IMPRESSION  1. No fracture or subluxation. 2. Multilevel degenerative changes. 3. Bilateral carotid artery atheromatous calcifications. These results were called by telephone at the time of interpretation on 08/04/2013 at 3:03 p.m. to Dr. Vanetta Mulders , who verbally acknowledged these results.   Electronically Signed   By: Gordan Payment M.D.   On: 08/04/2013 15:03    Microbiology: Recent Results (from the past 240 hour(s))  CLOSTRIDIUM DIFFICILE BY PCR     Status: None   Collection Time    08/05/13 11:37 AM      Result Value Range Status   C difficile by pcr NEGATIVE  NEGATIVE Final     Labs: Basic Metabolic Panel:  Recent Labs Lab 08/04/13 1640 08/05/13 0635  NA 145 141  K 4.3 4.5  CL 107 104  CO2 28 26  GLUCOSE 72 84  BUN 18 16  CREATININE 0.81 0.96  CALCIUM 9.4 9.3   Liver Function Tests:  Recent Labs Lab 08/04/13 1640  AST 16  ALT 10  ALKPHOS 238*  BILITOT 0.8  PROT 7.4  ALBUMIN 4.0   No results found for this basename: LIPASE, AMYLASE,  in the last 168 hours No results found for this basename: AMMONIA,  in the last 168 hours CBC:  Recent Labs Lab 08/04/13 1640 08/05/13 0635 08/06/13 0620  WBC 8.9 10.3 8.8  NEUTROABS 6.6  --   --   HGB 13.3 13.5 13.7  HCT 39.9 39.6 38.2*  MCV 86.4 85.3 84.1  PLT 150 165 142*   Cardiac Enzymes: No results found for this basename: CKTOTAL, CKMB,  CKMBINDEX, TROPONINI,  in the last 168 hours BNP: BNP (last 3 results)  Recent Labs  08/04/13 1640  PROBNP 329.1   CBG: No results found for this basename: GLUCAP,  in the last 168 hours     Signed:  Jeralyn Bennett  [facility] 08/06/2013, 12:43 PM

## 2013-08-06 NOTE — Progress Notes (Signed)
No issues overnight. Pt in bed, non-responsive to questioning.  EXAM:  BP 101/85  Pulse 74  Temp(Src) 98 F (36.7 C) (Oral)  Resp 18  Ht 6\' 3"  (1.905 m)  Wt 56.5 kg (124 lb 9 oz)  BMI 15.57 kg/m2  SpO2 97%  Easily arousable Says occassional words, does not answer questions Moves all extremities spontaneously, not reliably following commands  IMPRESSION:  79 y.o. male HD# 2 s/p fall with multiple small ICH and baseline dementia, neurologically unchanged  PLAN: - Repeat CTH this am - Cont to observe

## 2013-08-06 NOTE — Progress Notes (Signed)
Subjective: Patient resting in bed, appears comfortable. CT repeated this morning, approximately 43 hours following initial CT. It shows early clearing of multiple small areas of intracranial hemorrhage. Small amounts of subdural blood along the falx and tentorium. No evidence of mass effect or shift.  Objective: Vital signs in last 24 hours: Filed Vitals:   08/05/13 1414 08/05/13 2119 08/06/13 0210 08/06/13 0611  BP: 104/57 103/50 101/86 101/85  Pulse: 80 118 58 74  Temp: 97.9 F (36.6 C) 98.7 F (37.1 C) 97.3 F (36.3 C) 98 F (36.7 C)  TempSrc: Oral Oral Oral Oral  Resp: 16 18 16 18   Height:      Weight:      SpO2: 97%       Intake/Output from previous day: 10/10 0701 - 10/11 0700 In: -  Out: 425 [Urine:425] Intake/Output this shift:    Physical Exam:  Opens eyes to voice. No speech. Not following commands.  CBC  Recent Labs  08/05/13 0635 08/06/13 0620  WBC 10.3 8.8  HGB 13.5 13.7  HCT 39.6 38.2*  PLT 165 142*   BMET  Recent Labs  08/04/13 1640 08/05/13 0635  NA 145 141  K 4.3 4.5  CL 107 104  CO2 28 26  GLUCOSE 72 84  BUN 18 16  CREATININE 0.81 0.96  CALCIUM 9.4 9.3    Studies/Results: Dg Elbow Complete Right  08/04/2013   CLINICAL DATA:  Fall.  EXAM: RIGHT ELBOW - COMPLETE 3+ VIEW  COMPARISON:  None.  FINDINGS: Standard imaging was not able to be performed.  Degenerative changes without fracture or dislocation noted.  IMPRESSION: No fracture or dislocation is noted however, standard imaging was not able to be performed and therefore evaluation slightly limited.  Prominent olecranon spur.   Electronically Signed   By: [name] M.D.   On: 08/04/2013 13:59   Ct Head Wo Contrast  08/06/2013   CLINICAL DATA:  Subdural hematoma, laceration right frontal area  EXAM: CT HEAD WITHOUT CONTRAST  TECHNIQUE: Contiguous axial images were obtained from the base of the skull through the vertex without intravenous contrast.  COMPARISON:  08/04/2013  FINDINGS:  There is acute subdural hematoma in the posterior falx and posteriorly in the bilateral occipital regions. Subdural hematoma also layers over the right tentorium.Subdural hematoma over the right parieto-occipital region measures 5 mm. Subdural hematoma over the left occipital region measures 4 mm. Bilateral parafalcine hemorrhages are less apparent than they were previously. Right lateral subdural collection is also less apparent. Right frontal lobe areas of petechial hemorrhage are again identified, less prominent when compared to the prior study. There is no evidence of midline shift. There is diffuse atrophy with proportional dilatation of the ventricles. There is mild ethmoid sinus opacification bilaterally. No skull fracture is identified.  IMPRESSION: Small bilateral subdural hematomas. Areas of parenchymal hemorrhage show interval decrease in conspicuity and several scattered foci of isolated subdural hematoma are also decreased in conspicuity.   Electronically Signed   By: Esperanza Heir M.D.   On: 08/06/2013 09:26   Ct Head Wo Contrast  08/04/2013   CLINICAL DATA:  Right frontal soft tissue swelling following a fall. Unable to give history.  EXAM: CT HEAD WITHOUT CONTRAST  CT MAXILLOFACIAL WITHOUT CONTRAST  CT CERVICAL SPINE WITHOUT CONTRAST  TECHNIQUE: Multidetector CT imaging of the head, cervical spine, and maxillofacial structures were performed using the standard protocol without intravenous contrast. Multiplanar CT image reconstructions of the cervical spine and maxillofacial structures were also generated.  COMPARISON:  Previous examinations, the most recent dated 11/12/2009.  FINDINGS: CT HEAD FINDINGS  Bilateral parafalcine subdural hematomas, measuring 6 mm in thickness on the right and 4 mm in thickness on the left. There is also a small focal right frontal subdural hematoma measuring 8 mm in maximum thickness. There is also a small right lateral subdural hematoma with a maximum thickness of  4 mm. There are also small areas of cortical hemorrhage in the right frontal lobe anteriorly. No mass effect. Diffusely enlarged ventricles and subarachnoid spaces. Patchy white matter low density in both cerebral hemispheres. Right frontal scalp hematoma. No skull fractures or paranasal sinus air-fluid levels. Bilateral posterior ethmoid sinus mucosal thickening.  CT MAXILLOFACIAL FINDINGS  The examination is limited by artifacts produced by uncontrollable mouth movements. No facial fractures or paranasal sinus air-fluid levels are seen.  CT CERVICAL SPINE FINDINGS  Multilevel degenerative changes. Mild dextroconvex scoliosis. No prevertebral soft tissue swelling, fractures or subluxations. Bilateral carotid artery calcifications. Biapical pleural and parenchymal scarring.  IMPRESSION: CT HEAD IMPRESSION  1. Multiple subdural hematomas and areas of right frontal cortical hemorrhage, as described above. 2. Right frontal scalp hematoma. 3. Atrophy and chronic small vessel white matter ischemic changes. 4. Chronic bilateral ethmoid sinusitis.  CT MAXILLOFACIAL IMPRESSION  Limited examination with no visible facial fractures or paranasal sinus air-fluid levels.  CT CERVICAL SPINE IMPRESSION  1. No fracture or subluxation. 2. Multilevel degenerative changes. 3. Bilateral carotid artery atheromatous calcifications. These results were called by telephone at the time of interpretation on 08/04/2013 at 3:03 p.m. to Dr. Vanetta Mulders , who verbally acknowledged these results.   Electronically Signed   By: Gordan Payment M.D.   On: 08/04/2013 15:03   Ct Cervical Spine Wo Contrast  08/04/2013   CLINICAL DATA:  Right frontal soft tissue swelling following a fall. Unable to give history.  EXAM: CT HEAD WITHOUT CONTRAST  CT MAXILLOFACIAL WITHOUT CONTRAST  CT CERVICAL SPINE WITHOUT CONTRAST  TECHNIQUE: Multidetector CT imaging of the head, cervical spine, and maxillofacial structures were performed using the standard protocol  without intravenous contrast. Multiplanar CT image reconstructions of the cervical spine and maxillofacial structures were also generated.  COMPARISON:  Previous examinations, the most recent dated 11/12/2009.  FINDINGS: CT HEAD FINDINGS  Bilateral parafalcine subdural hematomas, measuring 6 mm in thickness on the right and 4 mm in thickness on the left. There is also a small focal right frontal subdural hematoma measuring 8 mm in maximum thickness. There is also a small right lateral subdural hematoma with a maximum thickness of 4 mm. There are also small areas of cortical hemorrhage in the right frontal lobe anteriorly. No mass effect. Diffusely enlarged ventricles and subarachnoid spaces. Patchy white matter low density in both cerebral hemispheres. Right frontal scalp hematoma. No skull fractures or paranasal sinus air-fluid levels. Bilateral posterior ethmoid sinus mucosal thickening.  CT MAXILLOFACIAL FINDINGS  The examination is limited by artifacts produced by uncontrollable mouth movements. No facial fractures or paranasal sinus air-fluid levels are seen.  CT CERVICAL SPINE FINDINGS  Multilevel degenerative changes. Mild dextroconvex scoliosis. No prevertebral soft tissue swelling, fractures or subluxations. Bilateral carotid artery calcifications. Biapical pleural and parenchymal scarring.  IMPRESSION: CT HEAD IMPRESSION  1. Multiple subdural hematomas and areas of right frontal cortical hemorrhage, as described above. 2. Right frontal scalp hematoma. 3. Atrophy and chronic small vessel white matter ischemic changes. 4. Chronic bilateral ethmoid sinusitis.  CT MAXILLOFACIAL IMPRESSION  Limited examination with no visible facial fractures or paranasal  sinus air-fluid levels.  CT CERVICAL SPINE IMPRESSION  1. No fracture or subluxation. 2. Multilevel degenerative changes. 3. Bilateral carotid artery atheromatous calcifications. These results were called by telephone at the time of interpretation on 08/04/2013  at 3:03 p.m. to Dr. Vanetta Mulders , who verbally acknowledged these results.   Electronically Signed   By: Gordan Payment M.D.   On: 08/04/2013 15:03   Dg Chest Port 1 View  08/04/2013   CLINICAL DATA:  Fall, head injury, altered mental status, history coronary disease, hypertension, diabetes, renal failure  EXAM: PORTABLE CHEST - 1 VIEW  COMPARISON:  Portable exam at 1646 hr compared to 11/12/2009  FINDINGS: Normal heart size post CABG.  Mediastinal contours and pulmonary vascularity normal.  Biapical scarring.  Lungs otherwise clear.  No pleural effusion or pneumothorax.  Posttraumatic deformities of multiple left ribs, old and unchanged.  Diffuse osseous demineralization.  IMPRESSION: No acute abnormalities.   Electronically Signed   By: Ulyses Southward M.D.   On: 08/04/2013 17:03   Ct Maxillofacial Wo Cm  08/04/2013   CLINICAL DATA:  Right frontal soft tissue swelling following a fall. Unable to give history.  EXAM: CT HEAD WITHOUT CONTRAST  CT MAXILLOFACIAL WITHOUT CONTRAST  CT CERVICAL SPINE WITHOUT CONTRAST  TECHNIQUE: Multidetector CT imaging of the head, cervical spine, and maxillofacial structures were performed using the standard protocol without intravenous contrast. Multiplanar CT image reconstructions of the cervical spine and maxillofacial structures were also generated.  COMPARISON:  Previous examinations, the most recent dated 11/12/2009.  FINDINGS: CT HEAD FINDINGS  Bilateral parafalcine subdural hematomas, measuring 6 mm in thickness on the right and 4 mm in thickness on the left. There is also a small focal right frontal subdural hematoma measuring 8 mm in maximum thickness. There is also a small right lateral subdural hematoma with a maximum thickness of 4 mm. There are also small areas of cortical hemorrhage in the right frontal lobe anteriorly. No mass effect. Diffusely enlarged ventricles and subarachnoid spaces. Patchy white matter low density in both cerebral hemispheres. Right frontal  scalp hematoma. No skull fractures or paranasal sinus air-fluid levels. Bilateral posterior ethmoid sinus mucosal thickening.  CT MAXILLOFACIAL FINDINGS  The examination is limited by artifacts produced by uncontrollable mouth movements. No facial fractures or paranasal sinus air-fluid levels are seen.  CT CERVICAL SPINE FINDINGS  Multilevel degenerative changes. Mild dextroconvex scoliosis. No prevertebral soft tissue swelling, fractures or subluxations. Bilateral carotid artery calcifications. Biapical pleural and parenchymal scarring.  IMPRESSION: CT HEAD IMPRESSION  1. Multiple subdural hematomas and areas of right frontal cortical hemorrhage, as described above. 2. Right frontal scalp hematoma. 3. Atrophy and chronic small vessel white matter ischemic changes. 4. Chronic bilateral ethmoid sinusitis.  CT MAXILLOFACIAL IMPRESSION  Limited examination with no visible facial fractures or paranasal sinus air-fluid levels.  CT CERVICAL SPINE IMPRESSION  1. No fracture or subluxation. 2. Multilevel degenerative changes. 3. Bilateral carotid artery atheromatous calcifications. These results were called by telephone at the time of interpretation on 08/04/2013 at 3:03 p.m. to Dr. Vanetta Mulders , who verbally acknowledged these results.   Electronically Signed   By: Gordan Payment M.D.   On: 08/04/2013 15:03    Assessment/Plan: Stable from minor head injury. CT scan of the brain improved. No indication for neurosurgical intervention, nor further neurosurgical followup.   Hewitt Shorts, MD 08/06/2013, 12:02 PM

## 2013-08-07 ENCOUNTER — Telehealth: Payer: Self-pay | Admitting: Family Medicine

## 2013-08-07 MED ORDER — INFLUENZA VAC SPLIT QUAD 0.5 ML IM SUSP
0.5000 mL | INTRAMUSCULAR | Status: AC
  Administered 2013-08-08: 0.5 mL via INTRAMUSCULAR
  Filled 2013-08-07: qty 0.5

## 2013-08-07 NOTE — Progress Notes (Signed)
CSW spoke with patient's daughter [name]- [phone]. She stated that she had received earlier messages from CSW but has been working and unable to return calls.  She states that she wants patient to return to [facility] when stable.  Per notation in chart- MD had requested return to facility yesterday but the facility declined return.  Plan d/c to SNF 08/08/13. Weekday CSW will follow up with facility to complete d/c.  Lorri Frederick. [place]  [phone]

## 2013-08-07 NOTE — Progress Notes (Signed)
[facility] PROGRESS NOTE  [name] ION:[id number] DOB: 09-12-1932 DOA: 08/04/2013 PCP: [name], MD  Assessment/Plan: 1. Subdural hematoma. Patient with history of dementia, having a fall at his nursing home. Initial CT revealed multiple subdural hematomas per radiology report. Repeat CT scan of brain showing improvement. Neurosurgery signed off. 2. Status post fall. Patient with history of advanced dementia having a fall at his nursing home. At baseline, he requires assistance with activities of daily living. 3. Coronary artery disease. Patient with history of established coronary artery disease, antiplatelet therapy has been discontinued due to subdural hematoma. 4. Atrial fibrillation. EKG showed A. fib, patient not candidate for anticoagulation given recurrent falls and subdural hematoma. He remains rate controlled. 5. Advanced dementia. Patient with poor functional status at baseline. On Namenda and exalon. 6. DVT prophylaxis. SCDs  Code Status: Full code Disposition Plan: Plan to discharge patient on Monday morning   Consultants:  Neurosurgery   HPI/Subjective: Repeat a CT scan of brain yesterday showed improvement. Radiology reporting areas of parenchymal hemorrhage show interval decrease inconspicuity and several scattered foci of isolated subdural hematoma also decreased. Discharge orders were written yesterday however nursing home can not accept patient until Monday morning.  Objective: Filed Vitals:   08/07/13 0539  BP: 119/65  Pulse: 76  Temp: 97.8 F (36.6 C)  Resp: 20    Intake/Output Summary (Last 24 hours) at 08/07/13 0910 Last data filed at 08/07/13 0500  Gross per 24 hour  Intake    440 ml  Output    475 ml  Net    -35 ml   Filed Weights   08/04/13 2113  Weight: 56.5 kg (124 lb 9 oz)    Exam:   General:  Patient is sedated however arousable. Demented, confused disoriented, not following commands.  Cardiovascular: Regular rate  rhythm normal S1-S2  Respiratory: Lungs are clear to auscultation bilaterally no wheezing rhonchi or rales  Abdomen: Soft nontender nondistended positive bowel sound  Musculoskeletal: Present range of motion to all extremities  Neurological: Patient moving all extremities, difficult to perform adequate neurologic examination given underlying dementia and sedation.   Data Reviewed: Basic Metabolic Panel:  Recent Labs Lab 08/04/13 1640 08/05/13 0635  NA 145 141  K 4.3 4.5  CL 107 104  CO2 28 26  GLUCOSE 72 84  BUN 18 16  CREATININE 0.81 0.96  CALCIUM 9.4 9.3   Liver Function Tests:  Recent Labs Lab 08/04/13 1640  AST 16  ALT 10  ALKPHOS 238*  BILITOT 0.8  PROT 7.4  ALBUMIN 4.0   No results found for this basename: LIPASE, AMYLASE,  in the last 168 hours No results found for this basename: AMMONIA,  in the last 168 hours CBC:  Recent Labs Lab 08/04/13 1640 08/05/13 0635 08/06/13 0620  WBC 8.9 10.3 8.8  NEUTROABS 6.6  --   --   HGB 13.3 13.5 13.7  HCT 39.9 39.6 38.2*  MCV 86.4 85.3 84.1  PLT 150 165 142*   Cardiac Enzymes: No results found for this basename: CKTOTAL, CKMB, CKMBINDEX, TROPONINI,  in the last 168 hours BNP (last 3 results)  Recent Labs  08/04/13 1640  PROBNP 329.1   CBG: No results found for this basename: GLUCAP,  in the last 168 hours  Recent Results (from the past 240 hour(s))  CLOSTRIDIUM DIFFICILE BY PCR     Status: None   Collection Time    08/05/13 11:37 AM      Result Value Range Status  C difficile by pcr NEGATIVE  NEGATIVE Final     Studies: Ct Head Wo Contrast  08/06/2013   CLINICAL DATA:  Subdural hematoma, laceration right frontal area  EXAM: CT HEAD WITHOUT CONTRAST  TECHNIQUE: Contiguous axial images were obtained from the base of the skull through the vertex without intravenous contrast.  COMPARISON:  08/04/2013  FINDINGS: There is acute subdural hematoma in the posterior falx and posteriorly in the bilateral  occipital regions. Subdural hematoma also layers over the right tentorium.Subdural hematoma over the right parieto-occipital region measures 5 mm. Subdural hematoma over the left occipital region measures 4 mm. Bilateral parafalcine hemorrhages are less apparent than they were previously. Right lateral subdural collection is also less apparent. Right frontal lobe areas of petechial hemorrhage are again identified, less prominent when compared to the prior study. There is no evidence of midline shift. There is diffuse atrophy with proportional dilatation of the ventricles. There is mild ethmoid sinus opacification bilaterally. No skull fracture is identified.  IMPRESSION: Small bilateral subdural hematomas. Areas of parenchymal hemorrhage show interval decrease in conspicuity and several scattered foci of isolated subdural hematoma are also decreased in conspicuity.   Electronically Signed   By: Esperanza Heir M.D.   On: 08/06/2013 09:26    Scheduled Meds: . ARIPiprazole  2 mg Oral Daily  . atorvastatin  10 mg Oral q1800  . feeding supplement (ENSURE COMPLETE)  237 mL Oral BID BM  . memantine  10 mg Oral BID  . Rivastigmine  13.3 mg Transdermal Daily  . senna-docusate  2 tablet Oral QHS  . sertraline  100 mg Oral Daily  . sodium chloride  3 mL Intravenous Q12H  . tamsulosin  0.4 mg Oral Daily   Continuous Infusions:   Principal Problem:   Subdural hematoma, post-traumatic Active Problems:   CORONARY, ARTERIOSCLEROSIS   HYPERTENSION, ESSENTIAL   Dementia   Fall at nursing home   Protein-calorie malnutrition, severe    Time spent: 35 minutes    Jeralyn Bennett  [facility] Pager [phone]. If 7PM-7AM, please contact night-coverage at [email or web address], password [facility] 08/07/2013, 9:10 AM  LOS: 3 days     ADDENDUM:  Patient likely to have Protein Calorie Malnutrition. He has a BMI of 15.6, in setting of advanced dementia. Nutrition consult.

## 2013-08-07 NOTE — Telephone Encounter (Signed)
Pt recently admitted from 10/9 to 08/2013 after sustaining subdural hematoma after fall at nursing home. I have not seen patient since 2012. If has physician who follows him at nursing home, to f/u with him.  If doesn't, to schedule appt with me within 2 wks.

## 2013-08-07 NOTE — Clinical Social Work Psychosocial (Addendum)
    Clinical Social Work Department BRIEF PSYCHOSOCIAL ASSESSMENT 08/07/2013  Patient:  [name], [name]     Account Number:  [id number]     Admit date:  08/04/2013  Clinical Social Worker:  Tiburcio Pea  Date/Time:  08/05/2013 05:00 PM  Referred by:  Physician  Date Referred:  08/05/2013 Referred for  Other - See comment   Other Referral:   Return to SNF   Interview type:  Other - See comment Other interview type:   Unable to reach family.  Numbers for Daine Gravel- daughter has been disconnected    PSYCHOSOCIAL DATA Living Status:  FACILITY Admitted from facility:   Level of care:   Primary support name:  [name]  [phone] Primary support relationship to patient:  CHILD, ADULT Degree of support available:   Unknown  [name] [phone]    CURRENT CONCERNS Current Concerns  Other - See comment   Other Concerns:   Return to SNF    SOCIAL WORK ASSESSMENT / PLAN 79 year old male- resident of [facility].  CSW spoke with Tammy - Admissions at [facility]- she stated that patient is a long term care patient at the facility and they will accept him back when medically stable.  CSW is attempting to reach family as patient has advanced dementia and was unable to talk to CSW. Fl2 will be placed on chart for MD's signature.   Assessment/plan status:  Psychosocial Support/Ongoing Assessment of Needs Other assessment/ plan:   Information/referral to community resources:   None at this time.    PATIENT'S/FAMILY'S RESPONSE TO PLAN OF CARE: Patient is unable to response to plan of care due to advanced dementia.  Numbers for patient's daughter [name] is disconnected;  messages left for other daughter [name] to call CSW.  Plan d/c back to [facility] when medically stable.  Lorri Frederick. Sherl Yzaguirre, LCSWA  [phone]

## 2013-08-08 NOTE — Progress Notes (Signed)
Patient d/c to snf today, assessments remain stable as at now. Being transported by [facility].

## 2013-08-08 NOTE — Progress Notes (Signed)
Patient for d/c today to SNF bed at [facility].  Per Lovette Cliche, MSW,LCSWA, family agreeable to return- will  transfer via EMS. Reece Levy, MSW, Theresia Majors [phone]

## 2013-08-09 NOTE — Telephone Encounter (Signed)
Attempted to call pt's daughter, Wight ([place]).  Cell number message said person was not accepting calls from this number and home number was disconnected.  Home number for pt is same as home number listed for pt's daughter.  No other phone numbers listed in record.

## 2013-08-12 ENCOUNTER — Non-Acute Institutional Stay (SKILLED_NURSING_FACILITY): Payer: Medicare Other | Admitting: Internal Medicine

## 2013-08-12 DIAGNOSIS — S069X9S Unspecified intracranial injury with loss of consciousness of unspecified duration, sequela: Secondary | ICD-10-CM

## 2013-08-12 DIAGNOSIS — E785 Hyperlipidemia, unspecified: Secondary | ICD-10-CM

## 2013-08-12 DIAGNOSIS — N400 Enlarged prostate without lower urinary tract symptoms: Secondary | ICD-10-CM

## 2013-08-12 DIAGNOSIS — S069XAS Unspecified intracranial injury with loss of consciousness status unknown, sequela: Secondary | ICD-10-CM

## 2013-08-12 DIAGNOSIS — F329 Major depressive disorder, single episode, unspecified: Secondary | ICD-10-CM

## 2013-08-12 DIAGNOSIS — F0280 Dementia in other diseases classified elsewhere without behavioral disturbance: Secondary | ICD-10-CM | POA: Insufficient documentation

## 2013-08-12 DIAGNOSIS — F3289 Other specified depressive episodes: Secondary | ICD-10-CM

## 2013-08-12 DIAGNOSIS — F0150 Vascular dementia without behavioral disturbance: Secondary | ICD-10-CM

## 2013-08-12 NOTE — Progress Notes (Signed)
Patient ID: [name], male   DOB: [birth year], 79 y.o.   MRN: [id number]  [facility]  Chief Complaint  Patient presents with  . Hospitalization Follow-up    re-admit    Allergies  Allergen Reactions  . Codeine     REACTION: Rash,itching   Code status- full code  HPI 79 y/o male patient is a long term care resident of the facility and was in the hospital from 08/04/13- 08/06/13 after a fall with subdural hematoma and right frontal cortical hemorrhage. He has advanced dementia, CAD s/p CABG and fall history. Neurology was consulted and repeat ct head showed interval decrease. No surgical intervention was done. He was then sent back to the facility. He was seen in his room today. History taking is limited due to his dementia. As per staff no recent falls. No new  Concern from staff  ROS As per staff No fever or chills No nausea or vomiting Needs to be fed Under total care No headache, seizure like activity  Past Medical History  Diagnosis Date  . Anxiety state, unspecified   . Arthritis     fingers/shoulders  . Unspecified cataract   . Colon polyps     last colonoscopy 2010  . CAD (coronary artery disease) 2006    s/p CABG  . Senile dementia, uncomplicated   . Depression   . HLD (hyperlipidemia)   . Gastric ulcer, unspecified as acute or chronic, without mention of hemorrhage, perforation, or obstruction   . Hypertension   . Insomnia   . TIA (transient ischemic attack)   . Clostridium difficile infection 2012    recurrent-last treatment 10/2010  . BPH (benign prostatic hypertrophy)   . H/O: hypothyroidism   . History of chronic pancreatitis   . Alcohol abuse, in remission     sober x years  . Paroxysmal a-fib     history of  . Hx: UTI (urinary tract infection)   . Diabetes mellitus type II     history of  . ARF (acute renal failure)     history of  . History of chicken pox   . Pulmonary nodule/lesion, solitary 05/2010    4mm L lung base nodule, if high  risk rec f/u with CT 1 yr, if low risk, no f/u needed  . Muscle weakness (generalized)   . Lack of coordination   . Psychosis   . Episodic mood disorder   . Extrapyramidal disorder   . Altered mental status    Past Surgical History  Procedure Laterality Date  . Coronary artery bypass graft  2006  . Injection knee  09/06/2001  . Cardiovascular stress test  05/2008    small regions of perfusion abnormality suggestive of diaphragm artifact, exercise cap 7 METs   Medication reviewed. See facility [facility]  History   Social History  . Marital Status: Widowed    Spouse Name: N/A    Number of Children: N/A  . Years of Education: N/A   Occupational History  . Retired [employer] architect, disability for back pain    Social History Main Topics  . Smoking status: Former Smoker    Quit date: 10/27/2005  . Smokeless tobacco: Not on file  . Alcohol Use: No     Comment: Alcoholism-Sober-Quit x 3 years  . Drug Use: No     Comment: MJ-Quit x3 years  . Sexual Activity: No   Other Topics Concern  . Not on file   Social History Narrative   Lives with  daughter and her fiance      8th grade education   Family History  Problem Relation Age of Onset  . Dementia Father     ? Alzheimers  . Heart attack Father   . Hypertension Brother   . Diabetes Brother     Physical exam  BP 102/62  Pulse 86  Temp(Src) 98.6 F (37 C)  Resp 18  SpO2 95%  Constitutional: thin built, frail, in no acute distress HEENT: no pallor, no LAD, mmm, Neck supple, No thyromegaly present.  Cardiovascular: Normal rate, regular rhythm and intact distal pulses.   Respiratory: Effort normal and breath sounds normal. No respiratory distress. He has no wheezes.  GI: Soft. Bowel sounds are normal. He exhibits no distension. There is no tenderness.  Musculoskeletal: He exhibits no edema.  Is able to move extremities  Neurological: He is alert only to self Skin: Skin is warm and dry. Resolving bruise on right side of the  face   Labs reviewed CBC    Component Value Date/Time   WBC 8.8 08/06/2013 0620   RBC 4.54 08/06/2013 0620   HGB 13.7 08/06/2013 0620   HCT 38.2* 08/06/2013 0620   HCT 46 05/30/2010   PLT 142* 08/06/2013 0620   PLT 177 05/30/2010   MCV 84.1 08/06/2013 0620   MCV 86.1 05/30/2010   MCH 30.2 08/06/2013 0620   MCHC 35.9 08/06/2013 0620   RDW 14.1 08/06/2013 0620   LYMPHSABS 1.5 08/04/2013 1640   MONOABS 0.8 08/04/2013 1640   EOSABS 0.1 08/04/2013 1640   BASOSABS 0.0 08/04/2013 1640   CMP     Component Value Date/Time   NA 141 08/05/2013 0635   NA 141 05/30/2010   K 4.5 08/05/2013 0635   K 3.7 05/30/2010   CL 104 08/05/2013 0635   CO2 26 08/05/2013 0635   GLUCOSE 84 08/05/2013 0635   BUN 16 08/05/2013 0635   BUN 13 05/30/2010   CREATININE 0.96 08/05/2013 0635   CREATININE 1.10 05/30/2010   CALCIUM 9.3 08/05/2013 0635   CALCIUM 9.3 05/30/2010   PROT 7.4 08/04/2013 1640   ALBUMIN 4.0 08/04/2013 1640   AST 16 08/04/2013 1640   AST 135 05/30/2010   ALT 10 08/04/2013 1640   ALKPHOS 238* 08/04/2013 1640   ALKPHOS 113 05/30/2010   BILITOT 0.8 08/04/2013 1640   BILITOT 1.9 05/30/2010   GFRNONAA 77* 08/05/2013 0635   GFRAA 89* 08/05/2013 0635    ASSESSMENT/ PLAN:  Subdural hematoma- in setting of the fall. Fall precautions. Hold all antiplatelet agent. Pt is off aspirin. Monitor cbc  Depression in setting of dementia- mood remains stable. No behavior changes reported. Continue abilify and sertraline  Hyperlipidemia- will continue lipitor 10 mg qhs for now He is presently stable on his current dose of zoloft 100 mg daily will monitor his status   Dementia- stable, continue exelon patch13.3 mg daily and namenda 10 mg bid and will monitor   Low back pain- Pain is being managed with vicodin 5/325 mg twice daily   BPH- continue flomax daily and will monitor  Labs- cbc

## 2013-08-23 NOTE — Progress Notes (Signed)
   August 04, 2013  Chief complaint: Fall, as patient attempted to stand from his backward leaning a wheelchair.  Approximately 11:58 AM, patient fell while trying to stand from his wheelchair. He hit his left elbow and hit his right for head. He had an automatic hematoma formation at the right forehead and a laceration to his elbow. He had independent movement of all 4 extremities. Was able to hold his head up without difficulty. Cognitive status, without any acute changes. Because of patient's significant hematoma development and affect he hit his head in a very abrupt manner, will call 911 for patient to be evaluated. This encounter was created in error - please disregard.

## 2013-09-12 ENCOUNTER — Other Ambulatory Visit: Payer: Self-pay | Admitting: *Deleted

## 2013-09-12 MED ORDER — HYDROCODONE-ACETAMINOPHEN 5-325 MG PO TABS: ORAL_TABLET | ORAL | Status: DC

## 2013-09-15 NOTE — Progress Notes (Signed)
CODE STATUS DO NOT RESUSCITATE [facility] date of visit 08/04/2013  Room 206 A  Patient ID: [name], male   DOB: 08-14-1932, 79 y.o.   MRN: [id number]  August 04, 2013   Allergies  Allergen Reactions  . Codeine     REACTION: Rash,itching    Chief Complaint  Patient presents with  . Acute Visit    HPI:  Patient has a wheelchair that  slants back at the seating position.  However, this morning, the  patient attempted to stand from his backward leaning  Wheelchair hitting his left elbow and right forehead.  and fell.     Review of Systems: Positive for a fall this a.m., hitting his R forehead and L elbow. Pt unable to participate in full ROS.  Current Outpatient Prescriptions on File Prior to Visit  Medication Sig Dispense Refill  . ARIPiprazole (ABILIFY) 2 MG tablet Take 2 mg by mouth daily.      Marland Kitchen atorvastatin (LIPITOR) 10 MG tablet Take 10 mg by mouth daily.      . cholecalciferol (VITAMIN D) 400 UNITS TABS tablet Take 400 Units by mouth daily.      . cyanocobalamin 1000 MCG tablet Take 100 mcg by mouth daily.      . feeding supplement, ENSURE COMPLETE, (ENSURE COMPLETE) LIQD Take 237 mLs by mouth 3 (three) times daily between meals.  20 Bottle  0  . loratadine (CLARITIN) 10 MG tablet Take 10 mg by mouth daily.      . Melatonin 3 MG TABS Take 3 mg by mouth at bedtime.      . memantine (NAMENDA) 10 MG tablet Take 10 mg by mouth 2 (two) times daily.      . Multiple Vitamin (MULTIVITAMIN) tablet Take 1 tablet by mouth daily.        . Rivastigmine (EXELON) 13.3 MG/24HR PT24 Place 13.3 mg onto the skin daily.      . sennosides-docusate sodium (SENOKOT-S) 8.6-50 MG tablet Take 2 tablets by mouth daily.      . sertraline (ZOLOFT) 100 MG tablet Take 100 mg by mouth daily.      . tamsulosin (FLOMAX) 0.4 MG CAPS capsule Take 1 capsule (0.4 mg total) by mouth daily.  30 capsule  0   No current facility-administered medications on file prior to visit.   Past Medical  History  Diagnosis Date  . Anxiety state, unspecified   . Arthritis     fingers/shoulders  . Unspecified cataract   . Colon polyps     last colonoscopy 2010  . CAD (coronary artery disease) 2006    s/p CABG  . Senile dementia, uncomplicated   . Depression   . HLD (hyperlipidemia)   . Gastric ulcer, unspecified as acute or chronic, without mention of hemorrhage, perforation, or obstruction   . Hypertension   . Insomnia   . TIA (transient ischemic attack)   . Clostridium difficile infection 2012    recurrent-last treatment 10/2010  . BPH (benign prostatic hypertrophy)   . H/O: hypothyroidism   . History of chronic pancreatitis   . Alcohol abuse, in remission     sober x years  . Paroxysmal a-fib     history of  . Hx: UTI (urinary tract infection)   . Diabetes mellitus type II     history of  . ARF (acute renal failure)     history of  . History of chicken pox   . Pulmonary nodule/lesion, solitary 05/2010  4mm L lung base nodule, if high risk rec f/u with CT 1 yr, if low risk, no f/u needed  . Muscle weakness (generalized)   . Lack of coordination   . Psychosis   . Episodic mood disorder   . Extrapyramidal disorder   . Altered mental status    Past Surgical History  Procedure Laterality Date  . Coronary artery bypass graft  2006  . Injection knee  09/06/2001  . Cardiovascular stress test  05/2008    small regions of perfusion abnormality suggestive of diaphragm artifact, exercise cap 7 METs     Patient ID: [name], male   DOB: [birth year], 79 y.o.   MRN: [id number]  [facility]  Allergies  Allergen Reactions  . Codeine     REACTION: Rash,itching     Chief Complaint  Patient presents with  . Acute Visit    HPI:  Patient has a backward slanting wheelchair, attempting to stand, fell, hitting his right forehead and left elbow. He sustained a hematoma to the 4 head and a laceration to the elbow.   Review of Systems: Fall this late a.m., sustaining  injury to right  forehead and left elbow. Patient unable to participate in review of systems.  Past Medical History  Diagnosis Date  . Anxiety state, unspecified   . Arthritis     fingers/shoulders  . Unspecified cataract   . Colon polyps     last colonoscopy 2010  . CAD (coronary artery disease) 2006    s/p CABG  . Senile dementia, uncomplicated   . Depression   . HLD (hyperlipidemia)   . Gastric ulcer, unspecified as acute or chronic, without mention of hemorrhage, perforation, or obstruction   . Hypertension   . Insomnia   . TIA (transient ischemic attack)   . Clostridium difficile infection 2012    recurrent-last treatment 10/2010  . BPH (benign prostatic hypertrophy)   . H/O: hypothyroidism   . History of chronic pancreatitis   . Alcohol abuse, in remission     sober x years  . Paroxysmal a-fib     history of  . Hx: UTI (urinary tract infection)   . Diabetes mellitus type II     history of  . ARF (acute renal failure)     history of  . History of chicken pox   . Pulmonary nodule/lesion, solitary 05/2010    4mm L lung base nodule, if high risk rec f/u with CT 1 yr, if low risk, no f/u needed  . Muscle weakness (generalized)   . Lack of coordination   . Psychosis   . Episodic mood disorder   . Extrapyramidal disorder   . Altered mental status     Past Surgical History  Procedure Laterality Date  . Coronary artery bypass graft  2006  . Injection knee  09/06/2001  . Cardiovascular stress test  05/2008    small regions of perfusion abnormality suggestive of diaphragm artifact, exercise cap 7 METs    Patient's Medications  New Prescriptions   No medications on file  Previous Medications   ARIPIPRAZOLE (ABILIFY) 2 MG TABLET    Take 2 mg by mouth daily.   ATORVASTATIN (LIPITOR) 10 MG TABLET    Take 10 mg by mouth daily.   CHOLECALCIFEROL (VITAMIN D) 400 UNITS TABS TABLET    Take 400 Units by mouth daily.   CYANOCOBALAMIN 1000 MCG TABLET    Take 100 mcg by mouth  daily.   FEEDING SUPPLEMENT, ENSURE COMPLETE, (  ENSURE COMPLETE) LIQD    Take 237 mLs by mouth 3 (three) times daily between meals.   LORATADINE (CLARITIN) 10 MG TABLET    Take 10 mg by mouth daily.   MELATONIN 3 MG TABS    Take 3 mg by mouth at bedtime.   MEMANTINE (NAMENDA) 10 MG TABLET    Take 10 mg by mouth 2 (two) times daily.   MULTIPLE VITAMIN (MULTIVITAMIN) TABLET    Take 1 tablet by mouth daily.     RIVASTIGMINE (EXELON) 13.3 MG/24HR PT24    Place 13.3 mg onto the skin daily.   SENNOSIDES-DOCUSATE SODIUM (SENOKOT-S) 8.6-50 MG TABLET    Take 2 tablets by mouth daily.   SERTRALINE (ZOLOFT) 100 MG TABLET    Take 100 mg by mouth daily.   TAMSULOSIN (FLOMAX) 0.4 MG CAPS CAPSULE    Take 1 capsule (0.4 mg total) by mouth daily.  Modified Medications   Modified Medication Previous Medication   HYDROCODONE-ACETAMINOPHEN (NORCO/VICODIN) 5-325 MG PER TABLET HYDROcodone-acetaminophen (NORCO/VICODIN) 5-325 MG per tablet      Take one tablet by mouth twice daily (in the morning and at bedtime) for orthopedic pain    Take one tablet by mouth twice daily (in the morning and at bedtime) for orthopedic pain  Discontinued Medications   No medications on file   LABS REVIEWED:   03-08-13: wbc 21; hgb 15.0; hct 43.0; mcv 84.5; plt 165; glucose 56; bun 23; creat 1.02; k+ 3.9; na++141 liver normal albumin 4.1   BP 99/48  Pulse 78  Temp(Src) 98.2 F (36.8 C)  Resp 16  SpO2 95%  Physical Exam  Constitutional:  thin  Neck: Neck supple. No thyromegaly present.  Cardiovascular: Normal rate, regular rhythm and intact distal pulses.   Respiratory: Effort normal and breath sounds normal. No respiratory distress. He has no wheezes.  GI: Soft. Bowel sounds are normal. He exhibits no distension. There is no tenderness.  Musculoskeletal: He exhibits no edema.  Neurological: He is alert.  Patient is nonconversant as per his baseline. He does respond to simple commands such as opening her eyes, which is typical  for his baseline. Able to move all 4 extremities and hold up his head without difficulty Pupils are equally round and reactive to light, extraocular movements are intact. Examination of the right forehead reveals a very large hematoma, and a laceration with immediate ecchymotic areas to the left elbow.  Skin: Skin is warm and dry.      ASSESSMENT/ PLAN: Because of patient's immediate development of a  significant hematoma, after hitting his head on the floor, 911 was called for transport to hospital.  Pt was left on the floor, in, made as comfortable as possible, staff member present immediately at his side.  Will re-eval plan of care after pt's hospital discharge

## 2013-10-10 ENCOUNTER — Other Ambulatory Visit: Payer: Self-pay | Admitting: *Deleted

## 2013-10-10 MED ORDER — HYDROCODONE-ACETAMINOPHEN 5-325 MG PO TABS: ORAL_TABLET | ORAL | Status: DC

## 2013-11-11 ENCOUNTER — Other Ambulatory Visit: Payer: Self-pay | Admitting: *Deleted

## 2013-11-11 MED ORDER — HYDROCODONE-ACETAMINOPHEN 5-325 MG PO TABS: ORAL_TABLET | ORAL | Status: DC

## 2013-11-14 ENCOUNTER — Encounter: Payer: Self-pay | Admitting: Adult Health

## 2013-11-14 ENCOUNTER — Non-Acute Institutional Stay (SKILLED_NURSING_FACILITY): Payer: Medicare Other | Admitting: Adult Health

## 2013-11-14 DIAGNOSIS — F0150 Vascular dementia without behavioral disturbance: Secondary | ICD-10-CM

## 2013-11-14 DIAGNOSIS — F3289 Other specified depressive episodes: Secondary | ICD-10-CM

## 2013-11-14 DIAGNOSIS — E785 Hyperlipidemia, unspecified: Secondary | ICD-10-CM

## 2013-11-14 DIAGNOSIS — N400 Enlarged prostate without lower urinary tract symptoms: Secondary | ICD-10-CM

## 2013-11-14 DIAGNOSIS — F0280 Dementia in other diseases classified elsewhere without behavioral disturbance: Secondary | ICD-10-CM

## 2013-11-14 DIAGNOSIS — F0393 Unspecified dementia, unspecified severity, with mood disturbance: Secondary | ICD-10-CM

## 2013-11-14 DIAGNOSIS — J309 Allergic rhinitis, unspecified: Secondary | ICD-10-CM

## 2013-11-14 DIAGNOSIS — S065X9A Traumatic subdural hemorrhage with loss of consciousness of unspecified duration, initial encounter: Secondary | ICD-10-CM

## 2013-11-14 DIAGNOSIS — K259 Gastric ulcer, unspecified as acute or chronic, without hemorrhage or perforation: Secondary | ICD-10-CM

## 2013-11-14 DIAGNOSIS — M545 Low back pain, unspecified: Secondary | ICD-10-CM

## 2013-11-14 DIAGNOSIS — S065XAA Traumatic subdural hemorrhage with loss of consciousness status unknown, initial encounter: Secondary | ICD-10-CM

## 2013-11-14 DIAGNOSIS — G309 Alzheimer's disease, unspecified: Secondary | ICD-10-CM

## 2013-11-14 DIAGNOSIS — F329 Major depressive disorder, single episode, unspecified: Secondary | ICD-10-CM

## 2013-11-14 DIAGNOSIS — G459 Transient cerebral ischemic attack, unspecified: Secondary | ICD-10-CM

## 2013-11-14 DIAGNOSIS — G4700 Insomnia, unspecified: Secondary | ICD-10-CM

## 2013-11-14 DIAGNOSIS — I6200 Nontraumatic subdural hemorrhage, unspecified: Secondary | ICD-10-CM

## 2013-11-14 NOTE — Progress Notes (Signed)
Patient ID: [name], male   DOB: 16-Nov-1931, 80 y.o.   MRN: [id number]     [facility]  Allergies  Allergen Reactions  . Codeine     REACTION: Rash,itching     Chief Complaint  Patient presents with  . Medical Managment of Chronic Issues    HPI:  He is being seen for the management of his chronic illnesses. There has not been a recent change in his status. There are no concerns being voiced by the nursing staff at this time. He is unable to participate in the hpi or ros. His family has no concerns at this time.    Past Medical History  Diagnosis Date  . Anxiety state, unspecified   . Arthritis     fingers/shoulders  . Unspecified cataract   . Colon polyps     last colonoscopy 2010  . CAD (coronary artery disease) 2006    s/p CABG  . Senile dementia, uncomplicated   . Depression   . HLD (hyperlipidemia)   . Gastric ulcer, unspecified as acute or chronic, without mention of hemorrhage, perforation, or obstruction   . Hypertension   . Insomnia   . TIA (transient ischemic attack)   . Clostridium difficile infection 2012    recurrent-last treatment 10/2010  . BPH (benign prostatic hypertrophy)   . H/O: hypothyroidism   . History of chronic pancreatitis   . Alcohol abuse, in remission     sober x years  . Paroxysmal a-fib     history of  . Hx: UTI (urinary tract infection)   . Diabetes mellitus type II     history of  . ARF (acute renal failure)     history of  . History of chicken pox   . Pulmonary nodule/lesion, solitary 05/2010    78mm L lung base nodule, if high risk rec f/u with CT 1 yr, if low risk, no f/u needed  . Muscle weakness (generalized)   . Lack of coordination   . Psychosis   . Episodic mood disorder   . Extrapyramidal disorder   . Altered mental status     Past Surgical History  Procedure Laterality Date  . Coronary artery bypass graft  2006  . Injection knee  09/06/2001  . Cardiovascular stress test  05/2008    small regions of  perfusion abnormality suggestive of diaphragm artifact, exercise cap 7 METs    VITAL SIGNS BP 104/64  Pulse 52  Ht 6\' 1"  (1.854 m)  Wt 123 lb 4.8 oz (55.929 kg)  BMI 16.27 kg/m2   Patient's Medications  New Prescriptions   No medications on file  Previous Medications   ARIPIPRAZOLE (ABILIFY) 2 MG TABLET    Take 2 mg by mouth daily.   ATORVASTATIN (LIPITOR) 10 MG TABLET    Take 10 mg by mouth daily.   CHOLECALCIFEROL (VITAMIN D) 400 UNITS TABS TABLET    Take 400 Units by mouth daily.   CYANOCOBALAMIN 1000 MCG TABLET    Take 100 mcg by mouth daily.   FEEDING SUPPLEMENT, ENSURE COMPLETE, (ENSURE COMPLETE) LIQD    Take 237 mLs by mouth 3 (three) times daily between meals.   HYDROCODONE-ACETAMINOPHEN (NORCO/VICODIN) 5-325 MG PER TABLET    Take one tablet by mouth twice daily (in the morning and at bedtime) for orthopedic pain   LORATADINE (CLARITIN) 10 MG TABLET    Take 10 mg by mouth daily.   MELATONIN 3 MG TABS    Take 3 mg by mouth  at bedtime.   MEMANTINE HCL ER (NAMENDA XR) 28 MG CP24    Take 28 mg by mouth daily.   MULTIPLE VITAMIN (MULTIVITAMIN) TABLET    Take 1 tablet by mouth daily.     RIVASTIGMINE (EXELON) 13.3 MG/24HR PT24    Place 13.3 mg onto the skin daily.   SENNOSIDES-DOCUSATE SODIUM (SENOKOT-S) 8.6-50 MG TABLET    Take 2 tablets by mouth daily.   SERTRALINE (ZOLOFT) 100 MG TABLET    Take 100 mg by mouth daily.   TAMSULOSIN (FLOMAX) 0.4 MG CAPS CAPSULE    Take 1 capsule (0.4 mg total) by mouth daily.  Modified Medications   No medications on file  Discontinued Medications   MEMANTINE (NAMENDA) 10 MG TABLET    Take 10 mg by mouth 2 (two) times daily.    SIGNIFICANT DIAGNOSTIC EXAMS   LABS REVIEWED:   03-08-13: wbc 21; hgb 15.0; hct 43.0; mcv 84.5; plt 165; glucose 56; bun 23; creat 1.02; k+ 3.9; na++141 liver normal albumin 4.1  07-13-13: urine culture: no growth 08-09-13: wbc 8.9; hgb 13.6; hct 40.7; mcv 87.7; plt 178; glucose 115; bun 20; creat 0.9; k+4.0;  na++137 08-15-13: wbc 8.0; hgb 12.8; hct 40.2; mcv 90.1.; plt 205 08-17-13: glucose 104; bun 20; creat 0.7; k+4.0; na++ 139 08-29-13: wbc 7.0; hgb 13.7;hct 41.8; mcv 89.1; plt 178; glucose 91; bun 18; creat 0.8; k+4.2; na++ 139 liver normal albumin 4.3   Review of Systems  Unable to perform ROS   Physical Exam  Constitutional:  thin  Neck: Neck supple. No thyromegaly present.  Cardiovascular: Normal rate, regular rhythm and intact distal pulses.   Respiratory: Effort normal and breath sounds normal. No respiratory distress. He has no wheezes.  GI: Soft. Bowel sounds are normal. He exhibits no distension. There is no tenderness.  Musculoskeletal: He exhibits no edema.  Is able to move extremities  Neurological: He is alert.  Skin: Skin is warm and dry.      ASSESSMENT/ PLAN:  DEPRESSION He is presently stable on his current dose of zoloft 100 mg daily will monitor his status   Dementia Is without change in status; will continue exelon patch 13.3 mg daily and namenda xr 28 mg daily and will monitor   GASTRIC ULCER, W/O HEMORRHAGE Is presently stable is currently not on medications will not make changes and will monitor his status    DYSLIPIDEMIA Will continue lipitor 10 mg daily will check a lipid profile; may be able to stop this stop medication in the future.   Allergic rhinitis  Will continue claritin 10 mg daily   TIA Is without change presently not on medications; will not make changes will monitor his status.  His asa was stopped in Oct 2014 due to subdural hematoma.   Insomnia Will continue melatonin 3 mg nightly   Constipation Will continue senna s 2 tabs daily and will monitor  BACK PAIN, LOW Pain is being managed there are no indications or pain present will continue  vicodin 5/325 mg twice daily   BPH (benign prostatic hyperplasia) Will continue flomax daily and will monitor  Psychosis:  No recent reports of behavioral issues present will continue  abilify 2 mg daily and will monitor

## 2013-11-15 LAB — LIPID PANEL
Cholesterol: 131 mg/dL (ref 0–200)
HDL: 45 mg/dL (ref 35–70)
LDL Cholesterol: 67 mg/dL
Triglycerides: 97 mg/dL (ref 40–160)

## 2013-12-08 ENCOUNTER — Other Ambulatory Visit: Payer: Self-pay | Admitting: *Deleted

## 2013-12-08 MED ORDER — HYDROCODONE-ACETAMINOPHEN 5-325 MG PO TABS: ORAL_TABLET | ORAL | Status: DC

## 2013-12-08 NOTE — Telephone Encounter (Signed)
[facility] 

## 2013-12-16 ENCOUNTER — Non-Acute Institutional Stay (SKILLED_NURSING_FACILITY): Payer: Medicare Other | Admitting: Adult Health

## 2013-12-16 DIAGNOSIS — G309 Alzheimer's disease, unspecified: Secondary | ICD-10-CM

## 2013-12-16 DIAGNOSIS — F0150 Vascular dementia without behavioral disturbance: Secondary | ICD-10-CM

## 2013-12-16 DIAGNOSIS — N400 Enlarged prostate without lower urinary tract symptoms: Secondary | ICD-10-CM

## 2013-12-16 DIAGNOSIS — G459 Transient cerebral ischemic attack, unspecified: Secondary | ICD-10-CM

## 2013-12-16 DIAGNOSIS — F0393 Unspecified dementia, unspecified severity, with mood disturbance: Secondary | ICD-10-CM

## 2013-12-16 DIAGNOSIS — F0280 Dementia in other diseases classified elsewhere without behavioral disturbance: Secondary | ICD-10-CM

## 2013-12-16 DIAGNOSIS — F3289 Other specified depressive episodes: Secondary | ICD-10-CM

## 2013-12-16 DIAGNOSIS — E43 Unspecified severe protein-calorie malnutrition: Secondary | ICD-10-CM

## 2013-12-16 DIAGNOSIS — G4700 Insomnia, unspecified: Secondary | ICD-10-CM

## 2013-12-16 DIAGNOSIS — E785 Hyperlipidemia, unspecified: Secondary | ICD-10-CM

## 2013-12-16 DIAGNOSIS — J309 Allergic rhinitis, unspecified: Secondary | ICD-10-CM

## 2013-12-16 DIAGNOSIS — K259 Gastric ulcer, unspecified as acute or chronic, without hemorrhage or perforation: Secondary | ICD-10-CM

## 2013-12-16 DIAGNOSIS — F329 Major depressive disorder, single episode, unspecified: Secondary | ICD-10-CM

## 2013-12-18 ENCOUNTER — Encounter: Payer: Self-pay | Admitting: Adult Health

## 2013-12-18 NOTE — Progress Notes (Signed)
Patient ID: [name], male   DOB: 1932/08/13, 80 y.o.   MRN: [id number]    [facility]   Allergies  Allergen Reactions  . Codeine     REACTION: Rash,itching     Chief Complaint  Patient presents with  . Annual Exam    HPI:  He is being seen for his annual exam. He has lost weight since his admission to this facility. His admission weight was 141.6 pounds his most recent weight was 124.3 pounds.  There is no recent change in his overall status.  There are no concerns being voiced by the nursing staff at this time. He is unable to fully participate in the hpi or ros.    Past Medical History  Diagnosis Date  . Anxiety state, unspecified   . Arthritis     fingers/shoulders  . Unspecified cataract   . Colon polyps     last colonoscopy 2010  . CAD (coronary artery disease) 2006    s/p CABG  . Senile dementia, uncomplicated   . Depression   . HLD (hyperlipidemia)   . Gastric ulcer, unspecified as acute or chronic, without mention of hemorrhage, perforation, or obstruction   . Hypertension   . Insomnia   . TIA (transient ischemic attack)   . Clostridium difficile infection 2012    recurrent-last treatment 10/2010  . BPH (benign prostatic hypertrophy)   . H/O: hypothyroidism   . History of chronic pancreatitis   . Alcohol abuse, in remission     sober x years  . Paroxysmal a-fib     history of  . Hx: UTI (urinary tract infection)   . Diabetes mellitus type II     history of  . ARF (acute renal failure)     history of  . History of chicken pox   . Pulmonary nodule/lesion, solitary 05/2010    50mm L lung base nodule, if high risk rec f/u with CT 1 yr, if low risk, no f/u needed  . Muscle weakness (generalized)   . Lack of coordination   . Psychosis   . Episodic mood disorder   . Extrapyramidal disorder   . Altered mental status   . ALCOHOL ABUSE, HX OF 12/24/2006    Qualifier: Diagnosis of  By: [name]  MD, [name]    . COLON POLYP 12/24/2006    Qualifier:  Diagnosis of  By: [name]    . Oral lesion 02/27/2011  . Subdural hematoma, post-traumatic 08/04/2013  . Protein-calorie malnutrition, severe 08/05/2013    Past Surgical History  Procedure Laterality Date  . Coronary artery bypass graft  2006  . Injection knee  09/06/2001  . Cardiovascular stress test  05/2008    small regions of perfusion abnormality suggestive of diaphragm artifact, exercise cap 7 METs    History   Social History  . Marital Status: Widowed    Spouse Name: N/A    Number of Children: N/A  . Years of Education: N/A   Occupational History  . Retired Administrator, disability for back pain    Social History Main Topics  . Smoking status: Former Smoker    Quit date: 10/27/2005  . Smokeless tobacco: Not on file  . Alcohol Use: No     Comment: Alcoholism-Sober-Quit x 3 years  . Drug Use: No     Comment: MJ-Quit x3 years  . Sexual Activity: No   Other Topics Concern  . Not on file   Social History Narrative   Long term resident  of skilled nursing       8th grade education    Family History  Problem Relation Age of Onset  . Dementia Father     ? Alzheimers  . Heart attack Father   . Hypertension Brother   . Diabetes Brother      VITAL SIGNS BP 128/74  Pulse 68  Ht 6\' 1"  (1.854 m)  Wt 124 lb 4.8 oz (56.382 kg)  BMI 16.40 kg/m2   Patient's Medications  New Prescriptions   No medications on file  Previous Medications   ARIPIPRAZOLE (ABILIFY) 2 MG TABLET    Take 2 mg by mouth daily.   ATORVASTATIN (LIPITOR) 10 MG TABLET    Take 10 mg by mouth daily.   CHOLECALCIFEROL (VITAMIN D) 400 UNITS TABS TABLET    Take 400 Units by mouth daily.   CYANOCOBALAMIN 1000 MCG TABLET    Take 100 mcg by mouth daily.   FEEDING SUPPLEMENT, ENSURE COMPLETE, (ENSURE COMPLETE) LIQD    Take 237 mLs by mouth 3 (three) times daily between meals.   HYDROCODONE-ACETAMINOPHEN (NORCO/VICODIN) 5-325 MG PER TABLET    Take one tablet by mouth twice daily (in the morning  and at bedtime) for orthopedic pain   LORATADINE (CLARITIN) 10 MG TABLET    Take 10 mg by mouth daily.   MELATONIN 3 MG TABS    Take 3 mg by mouth at bedtime.   MEMANTINE HCL ER (NAMENDA XR) 28 MG CP24    Take 28 mg by mouth daily.   MULTIPLE VITAMIN (MULTIVITAMIN) TABLET    Take 1 tablet by mouth daily.     RIVASTIGMINE (EXELON) 13.3 MG/24HR PT24    Place 13.3 mg onto the skin daily.   SENNOSIDES-DOCUSATE SODIUM (SENOKOT-S) 8.6-50 MG TABLET    Take 2 tablets by mouth daily.   SERTRALINE (ZOLOFT) 100 MG TABLET    Take 100 mg by mouth daily.   TAMSULOSIN (FLOMAX) 0.4 MG CAPS CAPSULE    Take 1 capsule (0.4 mg total) by mouth daily.  Modified Medications   No medications on file  Discontinued Medications   No medications on file    SIGNIFICANT DIAGNOSTIC EXAMS  LABS REVIEWED:   03-08-13: wbc 21; hgb 15.0; hct 43.0; mcv 84.5; plt 165; glucose 56; bun 23; creat 1.02; k+ 3.9; na++141 liver normal albumin 4.1  07-13-13: urine culture: no growth 08-09-13: wbc 8.9; hgb 13.6; hct 40.7; mcv 87.7; plt 178; glucose 115; bun 20; creat 0.9; k+4.0; na++137 08-15-13: wbc 8.0; hgb 12.8; hct 40.2; mcv 90.1.; plt 205 08-17-13: glucose 104; bun 20; creat 0.7; k+4.0; na++ 139 08-29-13: wbc 7.0; hgb 13.7;hct 41.8; mcv 89.1; plt 178; glucose 91; bun 18; creat 0.8; k+4.2; na++ 139 liver normal albumin 4.3        Review of Systems  Unable to perform ROS   Physical Exam  Constitutional:  thin  Neck: Neck supple. No thyromegaly present.  Cardiovascular: Normal rate, regular rhythm and intact distal pulses.   Respiratory: Effort normal and breath sounds normal. No respiratory distress. He has no wheezes.  GI: Soft. Bowel sounds are normal. He exhibits no distension. There is no tenderness.  Musculoskeletal: He exhibits no edema.  Is able to move extremities  Neurological: He is alert.  Skin: Skin is warm and dry.      ASSESSMENT/ PLAN:  DEPRESSION He is presently stable on his current dose of  zoloft 100 mg daily will monitor his status   Dementia Is without change in status;  will continue exelon patch 13.3 mg daily and namenda xr 28 mg daily and will monitor   GASTRIC ULCER, W/O HEMORRHAGE Is presently stable is currently not on medications will not make changes and will monitor his status    DYSLIPIDEMIA Will continue lipitor 10 mg daily will check a lipid profile; may be able to stop this stop medication in the future.   Allergic rhinitis  Will continue claritin 10 mg daily   TIA Is without change presently not on medications; will not make changes will monitor his status.  His asa was stopped in Oct 2014 due to subdural hematoma.   Insomnia Will continue melatonin 3 mg nightly   Constipation Will continue senna s 2 tabs daily and will monitor  BACK PAIN, LOW Pain is being managed there are no indications or pain present will continue  vicodin 5/325 mg twice daily   BPH (benign prostatic hyperplasia) Will continue flomax daily and will monitor  Psychosis:  No recent reports of behavioral issues present will continue abilify 2 mg daily and will monitor        Ok Edwards NP [facility]  Contact [phone] Monday through Friday 8am- 5pm  After hours call [phone]

## 2013-12-18 NOTE — Progress Notes (Signed)
Patient ID: [name], male   DOB: May 22, 1932, 80 y.o.   MRN: [id number] His lipitor is to be stopped at this time. This medication is no longer providing him with any benefit.

## 2014-01-09 ENCOUNTER — Other Ambulatory Visit: Payer: Self-pay | Admitting: *Deleted

## 2014-01-09 MED ORDER — HYDROCODONE-ACETAMINOPHEN 5-325 MG PO TABS: ORAL_TABLET | ORAL | Status: DC

## 2014-01-09 NOTE — Telephone Encounter (Signed)
[facility] 

## 2014-01-10 ENCOUNTER — Encounter: Payer: Self-pay | Admitting: Adult Health

## 2014-01-10 ENCOUNTER — Non-Acute Institutional Stay (SKILLED_NURSING_FACILITY): Payer: Medicare Other | Admitting: Adult Health

## 2014-01-10 DIAGNOSIS — M545 Low back pain, unspecified: Secondary | ICD-10-CM

## 2014-01-10 DIAGNOSIS — F3289 Other specified depressive episodes: Secondary | ICD-10-CM

## 2014-01-10 DIAGNOSIS — F0150 Vascular dementia without behavioral disturbance: Secondary | ICD-10-CM

## 2014-01-10 DIAGNOSIS — G309 Alzheimer's disease, unspecified: Secondary | ICD-10-CM

## 2014-01-10 DIAGNOSIS — E785 Hyperlipidemia, unspecified: Secondary | ICD-10-CM

## 2014-01-10 DIAGNOSIS — N400 Enlarged prostate without lower urinary tract symptoms: Secondary | ICD-10-CM

## 2014-01-10 DIAGNOSIS — G4700 Insomnia, unspecified: Secondary | ICD-10-CM

## 2014-01-10 DIAGNOSIS — K5900 Constipation, unspecified: Secondary | ICD-10-CM

## 2014-01-10 DIAGNOSIS — F0280 Dementia in other diseases classified elsewhere without behavioral disturbance: Secondary | ICD-10-CM

## 2014-01-10 DIAGNOSIS — F0393 Unspecified dementia, unspecified severity, with mood disturbance: Secondary | ICD-10-CM

## 2014-01-10 DIAGNOSIS — G459 Transient cerebral ischemic attack, unspecified: Secondary | ICD-10-CM

## 2014-01-10 DIAGNOSIS — K259 Gastric ulcer, unspecified as acute or chronic, without hemorrhage or perforation: Secondary | ICD-10-CM

## 2014-01-10 DIAGNOSIS — F329 Major depressive disorder, single episode, unspecified: Secondary | ICD-10-CM

## 2014-01-10 NOTE — Progress Notes (Signed)
Patient ID: [name], male   DOB: 1932-10-24, 80 y.o.   MRN: [id number]     [facility]  Allergies  Allergen Reactions  . Codeine     REACTION: Rash,itching     Chief Complaint  Patient presents with  . Medical Managment of Chronic Issues    HPI:  He is being seen for the management of his chronic illnesses. There are no concerns being voiced by the nursing staff at this time. There is no significant change in his overall status. He is unable to fully participate in the hpi or ros.    Past Medical History  Diagnosis Date  . Anxiety state, unspecified   . Arthritis     fingers/shoulders  . Unspecified cataract   . Colon polyps     last colonoscopy 2010  . CAD (coronary artery disease) 2006    s/p CABG  . Senile dementia, uncomplicated   . Depression   . HLD (hyperlipidemia)   . Gastric ulcer, unspecified as acute or chronic, without mention of hemorrhage, perforation, or obstruction   . Hypertension   . Insomnia   . TIA (transient ischemic attack)   . Clostridium difficile infection 2012    recurrent-last treatment 10/2010  . BPH (benign prostatic hypertrophy)   . H/O: hypothyroidism   . History of chronic pancreatitis   . Alcohol abuse, in remission     sober x years  . Paroxysmal a-fib     history of  . Hx: UTI (urinary tract infection)   . Diabetes mellitus type II     history of  . ARF (acute renal failure)     history of  . History of chicken pox   . Pulmonary nodule/lesion, solitary 05/2010    76mm L lung base nodule, if high risk rec f/u with CT 1 yr, if low risk, no f/u needed  . Muscle weakness (generalized)   . Lack of coordination   . Psychosis   . Episodic mood disorder   . Extrapyramidal disorder   . Altered mental status   . ALCOHOL ABUSE, HX OF 12/24/2006    Qualifier: Diagnosis of  By: [name]  MD, [name]    . COLON POLYP 12/24/2006    Qualifier: Diagnosis of  By: [name]    . Oral lesion 02/27/2011  . Subdural hematoma,  post-traumatic 08/04/2013  . Protein-calorie malnutrition, severe 08/05/2013    Past Surgical History  Procedure Laterality Date  . Coronary artery bypass graft  2006  . Injection knee  09/06/2001  . Cardiovascular stress test  05/2008    small regions of perfusion abnormality suggestive of diaphragm artifact, exercise cap 7 METs    VITAL SIGNS BP 109/64  Pulse 54  Ht 6\' 1"  (1.854 m)  Wt 141 lb 9.6 oz (64.229 kg)  BMI 18.69 kg/m2   Patient's Medications  New Prescriptions   No medications on file  Previous Medications   ARIPIPRAZOLE (ABILIFY) 2 MG TABLET    Take 2 mg by mouth daily.   CHOLECALCIFEROL (VITAMIN D) 400 UNITS TABS TABLET    Take 400 Units by mouth daily.   CYANOCOBALAMIN 1000 MCG TABLET    Take 100 mcg by mouth daily.   FEEDING SUPPLEMENT, ENSURE COMPLETE, (ENSURE COMPLETE) LIQD    Take 237 mLs by mouth 3 (three) times daily between meals.   HYDROCODONE-ACETAMINOPHEN (NORCO/VICODIN) 5-325 MG PER TABLET    Take one tablet by mouth twice daily (in the morning and at bedtime) for orthopedic  pain   LORATADINE (CLARITIN) 10 MG TABLET    Take 10 mg by mouth daily.   MELATONIN 3 MG TABS    Take 3 mg by mouth at bedtime.   MEMANTINE HCL ER (NAMENDA XR) 28 MG CP24    Take 28 mg by mouth daily.   MULTIPLE VITAMIN (MULTIVITAMIN) TABLET    Take 1 tablet by mouth daily.     RIVASTIGMINE (EXELON) 13.3 MG/24HR PT24    Place 13.3 mg onto the skin daily.   SENNOSIDES-DOCUSATE SODIUM (SENOKOT-S) 8.6-50 MG TABLET    Take 2 tablets by mouth daily.   SERTRALINE (ZOLOFT) 100 MG TABLET    Take 100 mg by mouth daily.   TAMSULOSIN (FLOMAX) 0.4 MG CAPS CAPSULE    Take 1 capsule (0.4 mg total) by mouth daily.  Modified Medications   No medications on file  Discontinued Medications   ATORVASTATIN (LIPITOR) 10 MG TABLET    Take 10 mg by mouth daily.    SIGNIFICANT DIAGNOSTIC EXAMS   LABS REVIEWED:   03-08-13: wbc 21; hgb 15.0; hct 43.0; mcv 84.5; plt 165; glucose 56; bun 23; creat 1.02;  k+ 3.9; na++141 liver normal albumin 4.1  07-13-13: urine culture: no growth 08-09-13: wbc 8.9; hgb 13.6; hct 40.7; mcv 87.7; plt 178; glucose 115; bun 20; creat 0.9; k+4.0; na++137 08-15-13: wbc 8.0; hgb 12.8; hct 40.2; mcv 90.1.; plt 205 08-17-13: glucose 104; bun 20; creat 0.7; k+4.0; na++ 139 08-29-13: wbc 7.0; hgb 13.7;hct 41.8; mcv 89.1; plt 178; glucose 91; bun 18; creat 0.8; k+4.2; na++ 139 liver normal albumin 4.3        Review of Systems  Unable to perform ROS   Physical Exam  Constitutional:  thin  Neck: Neck supple. No thyromegaly present.  Cardiovascular: Normal rate, regular rhythm and intact distal pulses.   Respiratory: Effort normal and breath sounds normal. No respiratory distress. He has no wheezes.  GI: Soft. Bowel sounds are normal. He exhibits no distension. There is no tenderness.  Musculoskeletal: He exhibits no edema.  Is able to move extremities  Neurological: He is alert.  Skin: Skin is warm and dry.      ASSESSMENT/ PLAN:  DEPRESSION He is presently stable on his current dose of zoloft 100 mg daily will monitor his status   Dementia Is without change in status; will continue exelon patch 13.3 mg daily and namenda xr 28 mg daily and will monitor   GASTRIC ULCER, W/O HEMORRHAGE Is presently stable is currently not on medications will not make changes and will monitor his status    DYSLIPIDEMIA His lipitor was stopped will not make changes at this time.   Allergic rhinitis  Will continue claritin 10 mg daily   TIA Is without change presently not on medications; will not make changes will monitor his status.  His asa was stopped in Oct 2014 due to subdural hematoma.   Insomnia Will continue melatonin 3 mg nightly   Constipation Will continue senna s 2 tabs daily and will monitor  BACK PAIN, LOW Pain is being managed there are no indications or pain present will continue  vicodin 5/325 mg twice daily   BPH (benign prostatic  hyperplasia) Will continue flomax daily and will monitor  Psychosis:  No recent reports of behavioral issues present will continue abilify 2 mg daily and will monitor            Ok Edwards NP [facility]  Contact [phone] Monday through Friday 8am- 5pm  After hours call [phone]

## 2014-02-08 ENCOUNTER — Other Ambulatory Visit: Payer: Self-pay | Admitting: *Deleted

## 2014-02-08 MED ORDER — HYDROCODONE-ACETAMINOPHEN 5-325 MG PO TABS: ORAL_TABLET | ORAL | Status: DC

## 2014-02-08 NOTE — Telephone Encounter (Signed)
[facility] 

## 2014-02-15 ENCOUNTER — Encounter: Payer: Self-pay | Admitting: Adult Health

## 2014-02-15 ENCOUNTER — Non-Acute Institutional Stay (SKILLED_NURSING_FACILITY): Payer: Medicare Other | Admitting: Adult Health

## 2014-02-15 DIAGNOSIS — F3289 Other specified depressive episodes: Secondary | ICD-10-CM

## 2014-02-15 DIAGNOSIS — F0280 Dementia in other diseases classified elsewhere without behavioral disturbance: Secondary | ICD-10-CM

## 2014-02-15 DIAGNOSIS — K259 Gastric ulcer, unspecified as acute or chronic, without hemorrhage or perforation: Secondary | ICD-10-CM

## 2014-02-15 DIAGNOSIS — E785 Hyperlipidemia, unspecified: Secondary | ICD-10-CM

## 2014-02-15 DIAGNOSIS — N400 Enlarged prostate without lower urinary tract symptoms: Secondary | ICD-10-CM

## 2014-02-15 DIAGNOSIS — J309 Allergic rhinitis, unspecified: Secondary | ICD-10-CM

## 2014-02-15 DIAGNOSIS — F329 Major depressive disorder, single episode, unspecified: Secondary | ICD-10-CM

## 2014-02-15 DIAGNOSIS — F0393 Unspecified dementia, unspecified severity, with mood disturbance: Secondary | ICD-10-CM

## 2014-02-15 DIAGNOSIS — M545 Low back pain, unspecified: Secondary | ICD-10-CM

## 2014-02-15 DIAGNOSIS — F0150 Vascular dementia without behavioral disturbance: Secondary | ICD-10-CM

## 2014-02-15 DIAGNOSIS — G459 Transient cerebral ischemic attack, unspecified: Secondary | ICD-10-CM

## 2014-02-15 DIAGNOSIS — K5900 Constipation, unspecified: Secondary | ICD-10-CM

## 2014-02-15 DIAGNOSIS — G309 Alzheimer's disease, unspecified: Secondary | ICD-10-CM

## 2014-02-15 DIAGNOSIS — I10 Essential (primary) hypertension: Secondary | ICD-10-CM

## 2014-02-15 NOTE — Progress Notes (Signed)
Patient ID: [name], male   DOB: 20-Aug-1932, 80 y.o.   MRN: [id number]     [facility]  Allergies  Allergen Reactions  . Codeine     REACTION: Rash,itching     Chief Complaint  Patient presents with  . Medical Management of Chronic Issues    HPI:  He is being seen for the management of his chronic illnesses. [facility] There have been no further issues for him. There are no concerns being voiced by the nursing staff at this time. He is unable to fully participate in the hpi or ros.    Past Medical History  Diagnosis Date  . Anxiety state, unspecified   . Arthritis     fingers/shoulders  . Unspecified cataract   . Colon polyps     last colonoscopy 2010  . CAD (coronary artery disease) 2006    s/p CABG  . Senile dementia, uncomplicated   . Depression   . HLD (hyperlipidemia)   . Gastric ulcer, unspecified as acute or chronic, without mention of hemorrhage, perforation, or obstruction   . Hypertension   . Insomnia   . TIA (transient ischemic attack)   . Clostridium difficile infection 2012    recurrent-last treatment 10/2010  . BPH (benign prostatic hypertrophy)   . H/O: hypothyroidism   . History of chronic pancreatitis   . Alcohol abuse, in remission     sober x years  . Paroxysmal a-fib     history of  . Hx: UTI (urinary tract infection)   . Diabetes mellitus type II     history of  . ARF (acute renal failure)     history of  . History of chicken pox   . Pulmonary nodule/lesion, solitary 05/2010    15mm L lung base nodule, if high risk rec f/u with CT 1 yr, if low risk, no f/u needed  . Muscle weakness (generalized)   . Lack of coordination   . Psychosis   . Episodic mood disorder   . Extrapyramidal disorder   . Altered mental status   . ALCOHOL ABUSE, HX OF 12/24/2006    Qualifier: Diagnosis of  By: [name]  MD, [name]    . COLON POLYP 12/24/2006    Qualifier: Diagnosis of  By: [name]    . Oral  lesion 02/27/2011  . Subdural hematoma, post-traumatic 08/04/2013  . Protein-calorie malnutrition, severe 08/05/2013    Past Surgical History  Procedure Laterality Date  . Coronary artery bypass graft  2006  . Injection knee  09/06/2001  . Cardiovascular stress test  05/2008    small regions of perfusion abnormality suggestive of diaphragm artifact, exercise cap 7 METs    VITAL SIGNS BP 109/70  Pulse 58  Ht 6\' 1"  (1.854 m)  Wt 146 lb (66.225 kg)  BMI 19.27 kg/m2   Patient's Medications  New Prescriptions   No medications on file  Previous Medications   ARIPIPRAZOLE (ABILIFY) 2 MG TABLET    Take 2 mg by mouth daily.   CHOLECALCIFEROL (VITAMIN D) 400 UNITS TABS TABLET    Take 400 Units by mouth daily.   CYANOCOBALAMIN 1000 MCG TABLET    Take 100 mcg by mouth daily.   FEEDING SUPPLEMENT, ENSURE COMPLETE, (ENSURE COMPLETE) LIQD    Take 237 mLs by mouth 3 (three) times daily between meals.   HYDROCODONE-ACETAMINOPHEN (NORCO/VICODIN) 5-325 MG PER TABLET    Take one tablet by mouth twice daily (  in morning and at bedtime) for orthopedic pain   LORATADINE (CLARITIN) 10 MG TABLET    Take 10 mg by mouth daily.   MELATONIN 3 MG TABS    Take 3 mg by mouth at bedtime.   MEMANTINE HCL ER (NAMENDA XR) 28 MG CP24    Take 28 mg by mouth daily.   MULTIPLE VITAMIN (MULTIVITAMIN) TABLET    Take 1 tablet by mouth daily.     RIVASTIGMINE (EXELON) 13.3 MG/24HR PT24    Place 13.3 mg onto the skin daily.   SENNOSIDES-DOCUSATE SODIUM (SENOKOT-S) 8.6-50 MG TABLET    Take 2 tablets by mouth daily.   SERTRALINE (ZOLOFT) 100 MG TABLET    Take 100 mg by mouth daily.  Modified Medications   No medications on file  Discontinued Medications   TAMSULOSIN (FLOMAX) 0.4 MG CAPS CAPSULE    Take 1 capsule (0.4 mg total) by mouth daily.    SIGNIFICANT DIAGNOSTIC EXAMS  LABS REVIEWED:   03-08-13: wbc 21; hgb 15.0; hct 43.0; mcv 84.5; plt 165; glucose 56; bun 23; creat 1.02; k+ 3.9; na++141 liver normal albumin 4.1    07-13-13: urine culture: no growth 08-09-13: wbc 8.9; hgb 13.6; hct 40.7; mcv 87.7; plt 178; glucose 115; bun 20; creat 0.9; k+4.0; na++137 08-15-13: wbc 8.0; hgb 12.8; hct 40.2; mcv 90.1.; plt 205 08-17-13: glucose 104; bun 20; creat 0.7; k+4.0; na++ 139 08-29-13: wbc 7.0; hgb 13.7;hct 41.8; mcv 89.1; plt 178; glucose 91; bun 18; creat 0.8; k+4.2; na++ 139 liver normal albumin 4.3  11-15-13: chol 131; ldl 67; trig 97 01-18-14: urine culture: klebsiella pneumoniae: levaquin        Review of Systems  Unable to perform ROS   Physical Exam  Constitutional:  thin  Neck: Neck supple. No thyromegaly present.  Cardiovascular: Normal rate, regular rhythm and intact distal pulses.   Respiratory: Effort normal and breath sounds normal. No respiratory distress. He has no wheezes.  GI: Soft. Bowel sounds are normal. He exhibits no distension. There is no tenderness.  Musculoskeletal: He exhibits no edema.  Is able to move extremities  Neurological: He is alert.  Skin: Skin is warm and dry.      ASSESSMENT/ PLAN:  DEPRESSION He is presently stable on his current dose of zoloft 100 mg daily will monitor his status   Dementia Is without change in status; will continue exelon patch 13.3 mg daily and namenda xr 28 mg daily and will monitor   GASTRIC ULCER, W/O HEMORRHAGE Is presently stable is currently not on medications will not make changes and will monitor his status    DYSLIPIDEMIA He remains stable off medications.    Allergic rhinitis  Will continue claritin 10 mg daily   TIA Is without change presently not on medications; will not make changes will monitor his status.  His asa was stopped in Oct 2014 due to subdural hematoma.   Insomnia Will continue melatonin 3 mg nightly   Constipation Will continue senna s 2 tabs daily and will monitor  BACK PAIN, LOW Pain is being managed there are no indications or pain present will continue  vicodin 5/325 mg twice daily   BPH  (benign prostatic hyperplasia) He is presently off medications as he cannot take the flomax whole; he remains stable will monitor   Psychosis:  No recent reports of behavioral issues present will continue abilify 2 mg daily and will monitor          Ok Edwards NP [facility]  Contact [phone] Monday through Friday 8am- 5pm  After hours call [phone]

## 2014-03-09 ENCOUNTER — Other Ambulatory Visit: Payer: Self-pay

## 2014-03-09 MED ORDER — HYDROCODONE-ACETAMINOPHEN 5-325 MG PO TABS: ORAL_TABLET | ORAL | Status: DC

## 2014-03-09 NOTE — Telephone Encounter (Signed)
Rx faxed to [facility] @ [phone], phone number [phone]  

## 2014-03-15 ENCOUNTER — Non-Acute Institutional Stay (SKILLED_NURSING_FACILITY): Payer: Medicare Other | Admitting: Adult Health

## 2014-03-15 DIAGNOSIS — G309 Alzheimer's disease, unspecified: Secondary | ICD-10-CM

## 2014-03-15 DIAGNOSIS — E785 Hyperlipidemia, unspecified: Secondary | ICD-10-CM

## 2014-03-15 DIAGNOSIS — G459 Transient cerebral ischemic attack, unspecified: Secondary | ICD-10-CM

## 2014-03-15 DIAGNOSIS — F329 Major depressive disorder, single episode, unspecified: Secondary | ICD-10-CM

## 2014-03-15 DIAGNOSIS — J309 Allergic rhinitis, unspecified: Secondary | ICD-10-CM

## 2014-03-15 DIAGNOSIS — I10 Essential (primary) hypertension: Secondary | ICD-10-CM

## 2014-03-15 DIAGNOSIS — G4700 Insomnia, unspecified: Secondary | ICD-10-CM

## 2014-03-15 DIAGNOSIS — F411 Generalized anxiety disorder: Secondary | ICD-10-CM

## 2014-03-15 DIAGNOSIS — F0280 Dementia in other diseases classified elsewhere without behavioral disturbance: Secondary | ICD-10-CM

## 2014-03-15 DIAGNOSIS — F3289 Other specified depressive episodes: Secondary | ICD-10-CM

## 2014-03-15 DIAGNOSIS — F0150 Vascular dementia without behavioral disturbance: Secondary | ICD-10-CM

## 2014-03-15 DIAGNOSIS — K259 Gastric ulcer, unspecified as acute or chronic, without hemorrhage or perforation: Secondary | ICD-10-CM

## 2014-03-15 DIAGNOSIS — K5900 Constipation, unspecified: Secondary | ICD-10-CM

## 2014-03-23 ENCOUNTER — Encounter: Payer: Self-pay | Admitting: Adult Health

## 2014-03-23 NOTE — Progress Notes (Signed)
Patient ID: [name], male   DOB: 1932-03-10, 80 y.o.   MRN: [id number]     [facility]  Allergies  Allergen Reactions  . Codeine     REACTION: Rash,itching     Chief Complaint  Patient presents with  . Medical Management of Chronic Issues    HPI:  He is being seen for the management of his chronic illnesses. There are no concerns being voiced by the nursing staff at this time. Overall his status is stable. He is unable to participate in the hpi or ros.    Past Medical History  Diagnosis Date  . Anxiety state, unspecified   . Arthritis     fingers/shoulders  . Unspecified cataract   . Colon polyps     last colonoscopy 2010  . CAD (coronary artery disease) 2006    s/p CABG  . Senile dementia, uncomplicated   . Depression   . HLD (hyperlipidemia)   . Gastric ulcer, unspecified as acute or chronic, without mention of hemorrhage, perforation, or obstruction   . Hypertension   . Insomnia   . TIA (transient ischemic attack)   . Clostridium difficile infection 2012    recurrent-last treatment 10/2010  . BPH (benign prostatic hypertrophy)   . H/O: hypothyroidism   . History of chronic pancreatitis   . Alcohol abuse, in remission     sober x years  . Paroxysmal a-fib     history of  . Hx: UTI (urinary tract infection)   . Diabetes mellitus type II     history of  . ARF (acute renal failure)     history of  . History of chicken pox   . Pulmonary nodule/lesion, solitary 05/2010    70mm L lung base nodule, if high risk rec f/u with CT 1 yr, if low risk, no f/u needed  . Muscle weakness (generalized)   . Lack of coordination   . Psychosis   . Episodic mood disorder   . Extrapyramidal disorder   . Altered mental status   . ALCOHOL ABUSE, HX OF 12/24/2006    Qualifier: Diagnosis of  By: [name]  MD, [name]    . COLON POLYP 12/24/2006    Qualifier: Diagnosis of  By: [name]    . Oral lesion 02/27/2011  . Subdural hematoma, post-traumatic 08/04/2013  .  Protein-calorie malnutrition, severe 08/05/2013    Past Surgical History  Procedure Laterality Date  . Coronary artery bypass graft  2006  . Injection knee  09/06/2001  . Cardiovascular stress test  05/2008    small regions of perfusion abnormality suggestive of diaphragm artifact, exercise cap 7 METs    VITAL SIGNS BP 100/68  Pulse 66  Ht 6\' 1"  (1.854 m)  Wt 148 lb (67.132 kg)  BMI 19.53 kg/m2   Patient's Medications  New Prescriptions   No medications on file  Previous Medications   ARIPIPRAZOLE (ABILIFY) 2 MG TABLET    Take 2 mg by mouth daily.   CHOLECALCIFEROL (VITAMIN D) 400 UNITS TABS TABLET    Take 400 Units by mouth daily.   CYANOCOBALAMIN 1000 MCG TABLET    Take 100 mcg by mouth daily.   FEEDING SUPPLEMENT, ENSURE COMPLETE, (ENSURE COMPLETE) LIQD    Take 237 mLs by mouth 3 (three) times daily between meals.   HYDROCODONE-ACETAMINOPHEN (NORCO/VICODIN) 5-325 MG PER TABLET    Take one tablet by mouth twice daily (in morning and at bedtime) for orthopedic pain. DO NOT EXCEED 4 GM OF TYLENOL  IN 24 HOURS   LORATADINE (CLARITIN) 10 MG TABLET    Take 10 mg by mouth daily.   MELATONIN 3 MG TABS    Take 3 mg by mouth at bedtime.   MEMANTINE HCL ER (NAMENDA XR) 28 MG CP24    Take 28 mg by mouth daily.   MULTIPLE VITAMIN (MULTIVITAMIN) TABLET    Take 1 tablet by mouth daily.     RIVASTIGMINE (EXELON) 13.3 MG/24HR PT24    Place 13.3 mg onto the skin daily.   SENNOSIDES-DOCUSATE SODIUM (SENOKOT-S) 8.6-50 MG TABLET    Take 2 tablets by mouth daily.   SERTRALINE (ZOLOFT) 100 MG TABLET    Take 100 mg by mouth daily.  Modified Medications   No medications on file  Discontinued Medications   No medications on file    SIGNIFICANT DIAGNOSTIC EXAMS   LABS REVIEWED:    07-13-13: urine culture: no growth 08-09-13: wbc 8.9; hgb 13.6; hct 40.7; mcv 87.7; plt 178; glucose 115; bun 20; creat 0.9; k+4.0; na++137 08-15-13: wbc 8.0; hgb 12.8; hct 40.2; mcv 90.1.; plt 205 08-17-13: glucose  104; bun 20; creat 0.7; k+4.0; na++ 139 08-29-13: wbc 7.0; hgb 13.7;hct 41.8; mcv 89.1; plt 178; glucose 91; bun 18; creat 0.8; k+4.2; na++ 139 liver normal albumin 4.3  11-15-13: chol 131; ldl 67; trig 97 01-18-14: urine culture: klebsiella pneumoniae: levaquin        Review of Systems  Unable to perform ROS   Physical Exam  Constitutional:  thin  Neck: Neck supple. No thyromegaly present.  Cardiovascular: Normal rate, regular rhythm and intact distal pulses.   Respiratory: Effort normal and breath sounds normal. No respiratory distress. He has no wheezes.  GI: Soft. Bowel sounds are normal. He exhibits no distension. There is no tenderness.  Musculoskeletal: He exhibits no edema.  Is able to move extremities  Neurological: He is alert.  Skin: Skin is warm and dry.      ASSESSMENT/ PLAN:  DEPRESSION He is presently stable on his current dose of zoloft 100 mg daily will monitor his status   Dementia Is without change in status; will continue exelon patch 13.3 mg daily and namenda xr 28 mg daily and will monitor   GASTRIC ULCER, W/O HEMORRHAGE Is presently stable is currently not on medications will not make changes and will monitor his status    DYSLIPIDEMIA He remains stable off medications.    Allergic rhinitis  Will continue claritin 10 mg daily   TIA Is without change presently not on medications; will not make changes will monitor his status.  His asa was stopped in Oct 2014 due to subdural hematoma.   Insomnia Will continue melatonin 3 mg nightly   Constipation Will continue senna s 2 tabs daily and will monitor  BACK PAIN, LOW Pain is being managed there are no indications or pain present will continue  vicodin 5/325 mg twice daily   BPH (benign prostatic hyperplasia) He is presently off medications as he cannot take the flomax whole; he remains stable will monitor   Psychosis:  No recent reports of behavioral issues present will continue abilify 2 mg  daily and will monitor is followed by nceps.    Will check cbc; cmp; lipids; vit d; vit b12; folate; hgb a1c       Ok Edwards NP [facility]  Contact [phone] Monday through Friday 8am- 5pm  After hours call [phone]

## 2014-04-11 ENCOUNTER — Other Ambulatory Visit: Payer: Self-pay | Admitting: *Deleted

## 2014-04-11 MED ORDER — HYDROCODONE-ACETAMINOPHEN 5-325 MG PO TABS: ORAL_TABLET | ORAL | Status: DC

## 2014-04-11 NOTE — Telephone Encounter (Signed)
[facility] 

## 2014-04-18 ENCOUNTER — Non-Acute Institutional Stay (SKILLED_NURSING_FACILITY): Payer: Medicare Other | Admitting: Adult Health

## 2014-04-18 DIAGNOSIS — G459 Transient cerebral ischemic attack, unspecified: Secondary | ICD-10-CM

## 2014-04-18 DIAGNOSIS — F3289 Other specified depressive episodes: Secondary | ICD-10-CM

## 2014-04-18 DIAGNOSIS — M545 Low back pain, unspecified: Secondary | ICD-10-CM

## 2014-04-18 DIAGNOSIS — E785 Hyperlipidemia, unspecified: Secondary | ICD-10-CM

## 2014-04-18 DIAGNOSIS — G309 Alzheimer's disease, unspecified: Secondary | ICD-10-CM

## 2014-04-18 DIAGNOSIS — F329 Major depressive disorder, single episode, unspecified: Secondary | ICD-10-CM

## 2014-04-18 DIAGNOSIS — K259 Gastric ulcer, unspecified as acute or chronic, without hemorrhage or perforation: Secondary | ICD-10-CM

## 2014-04-18 DIAGNOSIS — J309 Allergic rhinitis, unspecified: Secondary | ICD-10-CM

## 2014-04-18 DIAGNOSIS — F0150 Vascular dementia without behavioral disturbance: Secondary | ICD-10-CM

## 2014-04-18 DIAGNOSIS — F0280 Dementia in other diseases classified elsewhere without behavioral disturbance: Secondary | ICD-10-CM

## 2014-04-18 DIAGNOSIS — K5909 Other constipation: Secondary | ICD-10-CM

## 2014-04-18 DIAGNOSIS — F0393 Unspecified dementia, unspecified severity, with mood disturbance: Secondary | ICD-10-CM

## 2014-04-18 DIAGNOSIS — G4700 Insomnia, unspecified: Secondary | ICD-10-CM

## 2014-04-26 ENCOUNTER — Encounter: Payer: Self-pay | Admitting: Adult Health

## 2014-04-26 NOTE — Progress Notes (Signed)
Patient ID: [name], male   DOB: 07-24-1932, 80 y.o.   MRN: [id number]     [facility]  Allergies  Allergen Reactions  . Codeine     REACTION: Rash,itching     Chief Complaint  Patient presents with  . Medical Management of Chronic Issues    HPI:  He is being seen for the management of his chronic illnesses. Overall he remains without change in his status. There are no concerns being voiced by the nursing staff at this time. He is unable to fully participate in the hpi or ros; he states he feels fine.    Past Medical History  Diagnosis Date  . Anxiety state, unspecified   . Arthritis     fingers/shoulders  . Unspecified cataract   . Colon polyps     last colonoscopy 2010  . CAD (coronary artery disease) 2006    s/p CABG  . Senile dementia, uncomplicated   . Depression   . HLD (hyperlipidemia)   . Gastric ulcer, unspecified as acute or chronic, without mention of hemorrhage, perforation, or obstruction   . Hypertension   . Insomnia   . TIA (transient ischemic attack)   . Clostridium difficile infection 2012    recurrent-last treatment 10/2010  . BPH (benign prostatic hypertrophy)   . H/O: hypothyroidism   . History of chronic pancreatitis   . Alcohol abuse, in remission     sober x years  . Paroxysmal a-fib     history of  . Hx: UTI (urinary tract infection)   . Diabetes mellitus type II     history of  . ARF (acute renal failure)     history of  . History of chicken pox   . Pulmonary nodule/lesion, solitary 05/2010    32mm L lung base nodule, if high risk rec f/u with CT 1 yr, if low risk, no f/u needed  . Muscle weakness (generalized)   . Lack of coordination   . Psychosis   . Episodic mood disorder   . Extrapyramidal disorder   . Altered mental status   . ALCOHOL ABUSE, HX OF 12/24/2006    Qualifier: Diagnosis of  By: [name]  MD, [name]    . COLON POLYP 12/24/2006    Qualifier: Diagnosis of  By: [name]    . Oral lesion 02/27/2011  .  Subdural hematoma, post-traumatic 08/04/2013  . Protein-calorie malnutrition, severe 08/05/2013    Past Surgical History  Procedure Laterality Date  . Coronary artery bypass graft  2006  . Injection knee  09/06/2001  . Cardiovascular stress test  05/2008    small regions of perfusion abnormality suggestive of diaphragm artifact, exercise cap 7 METs    VITAL SIGNS BP 120/89  Pulse 67  Ht 6\' 1"  (1.854 m)  Wt 149 lb 3.2 oz (67.677 kg)  BMI 19.69 kg/m2  SpO2 96%   Patient's Medications  New Prescriptions   No medications on file  Previous Medications   ARIPIPRAZOLE (ABILIFY) 2 MG TABLET    Take 2 mg by mouth daily.   CHOLECALCIFEROL (VITAMIN D) 400 UNITS TABS TABLET    Take 400 Units by mouth daily.   CYANOCOBALAMIN 1000 MCG TABLET    Take 100 mcg by mouth daily.   FEEDING SUPPLEMENT, ENSURE COMPLETE, (ENSURE COMPLETE) LIQD    Take 237 mLs by mouth 3 (three) times daily between meals.   HYDROCODONE-ACETAMINOPHEN (NORCO/VICODIN) 5-325 MG PER TABLET    Take one tablet by mouth twice daily (in  morning and at bedtime) for orthopedic pain. DO NOT EXCEED 4 GM OF TYLENOL IN 24 HOURS   LORATADINE (CLARITIN) 10 MG TABLET    Take 10 mg by mouth daily.   MELATONIN 3 MG TABS    Take 3 mg by mouth at bedtime.   MEMANTINE HCL ER (NAMENDA XR) 28 MG CP24    Take 28 mg by mouth daily.   MULTIPLE VITAMIN (MULTIVITAMIN) TABLET    Take 1 tablet by mouth daily.     RIVASTIGMINE (EXELON) 13.3 MG/24HR PT24    Place 13.3 mg onto the skin daily.   SENNOSIDES-DOCUSATE SODIUM (SENOKOT-S) 8.6-50 MG TABLET    Take 2 tablets by mouth daily.   SERTRALINE (ZOLOFT) 100 MG TABLET    Take 100 mg by mouth daily.  Modified Medications   No medications on file  Discontinued Medications   No medications on file    SIGNIFICANT DIAGNOSTIC EXAMS  LABS REVIEWED:    07-13-13: urine culture: no growth 08-09-13: wbc 8.9; hgb 13.6; hct 40.7; mcv 87.7; plt 178; glucose 115; bun 20; creat 0.9; k+4.0; na++137 08-15-13: wbc  8.0; hgb 12.8; hct 40.2; mcv 90.1.; plt 205 08-17-13: glucose 104; bun 20; creat 0.7; k+4.0; na++ 139 08-29-13: wbc 7.0; hgb 13.7;hct 41.8; mcv 89.1; plt 178; glucose 91; bun 18; creat 0.8; k+4.2; na++ 139 liver normal albumin 4.3  11-15-13: chol 131; ldl 67; trig 97 01-18-14: urine culture: klebsiella pneumoniae: levaquin 03-16-14: wbc 7.5; hgb 15.4; hct 49.1; mcv 91.6; plt 195; glucose 71; bun 26; creat 1.0; k+4.4; na++142; liver normal albumin 4.3; chol 200; ldl 132; trig 110; folate >25; vit b12: 1338; vit d 28.67;  hgb a1c 5.9         Review of Systems  Unable to perform ROS   Physical Exam  Constitutional:  thin  Neck: Neck supple. No thyromegaly present.  Cardiovascular: Normal rate, regular rhythm and intact distal pulses.   Respiratory: Effort normal and breath sounds normal. No respiratory distress. He has no wheezes.  GI: Soft. Bowel sounds are normal. He exhibits no distension. There is no tenderness.  Musculoskeletal: He exhibits no edema.  Is able to move extremities  Neurological: He is alert.  Skin: Skin is warm and dry.      ASSESSMENT/ PLAN:  1. DEPRESSION He is presently stable on his current dose of zoloft 100 mg daily will monitor his status   2. Dementia Is without change in status; will continue exelon patch 13.3 mg daily and namenda xr 28 mg daily and will monitor   3. GASTRIC ULCER, W/O HEMORRHAGE Is presently stable is currently not on medications will not make changes and will monitor his status    4. DYSLIPIDEMIA His ldl is 132 off medications will monitor his status     5. Allergic rhinitis  Will continue claritin 10 mg daily   6. TIA Is without change presently not on medications; will not make changes will monitor his status.  His asa was stopped in Oct 2014 due to subdural hematoma.   7. Insomnia Will continue melatonin 3 mg nightly   8. Constipation Will continue senna s 2 tabs daily and will monitor  9. BACK PAIN, LOW Pain is  being managed there are no indications or pain present will continue  vicodin 5/325 mg twice daily   10. BPH (benign prostatic hyperplasia) He is presently off medications as he cannot take the flomax whole; he remains stable will monitor   11. Psychosis:  No  recent reports of behavioral issues present will continue abilify 2 mg daily and will monitor is followed by nceps.       Ok Edwards NP [facility]  Contact [phone] Monday through Friday 8am- 5pm  After hours call [phone]

## 2014-05-05 ENCOUNTER — Other Ambulatory Visit: Payer: Self-pay | Admitting: *Deleted

## 2014-05-05 MED ORDER — HYDROCODONE-ACETAMINOPHEN 5-325 MG PO TABS: ORAL_TABLET | ORAL | Status: DC

## 2014-05-05 NOTE — Telephone Encounter (Signed)
[facility] 

## 2014-05-17 ENCOUNTER — Non-Acute Institutional Stay (SKILLED_NURSING_FACILITY): Payer: Medicare Other | Admitting: Adult Health

## 2014-05-17 ENCOUNTER — Encounter: Payer: Self-pay | Admitting: Adult Health

## 2014-05-17 DIAGNOSIS — F0150 Vascular dementia without behavioral disturbance: Secondary | ICD-10-CM

## 2014-05-17 DIAGNOSIS — K5909 Other constipation: Secondary | ICD-10-CM

## 2014-05-17 DIAGNOSIS — F0280 Dementia in other diseases classified elsewhere without behavioral disturbance: Secondary | ICD-10-CM

## 2014-05-17 DIAGNOSIS — E559 Vitamin D deficiency, unspecified: Secondary | ICD-10-CM | POA: Insufficient documentation

## 2014-05-17 DIAGNOSIS — F29 Unspecified psychosis not due to a substance or known physiological condition: Secondary | ICD-10-CM

## 2014-05-17 DIAGNOSIS — M545 Low back pain, unspecified: Secondary | ICD-10-CM

## 2014-05-17 DIAGNOSIS — G309 Alzheimer's disease, unspecified: Secondary | ICD-10-CM

## 2014-05-17 DIAGNOSIS — E785 Hyperlipidemia, unspecified: Secondary | ICD-10-CM

## 2014-05-17 NOTE — Progress Notes (Signed)
Patient ID: [name], male   DOB: 03-11-1932, 80 y.o.   MRN: [id number]   [facility] (31)  Code Status:  Full Code  Chief Complaint  Patient presents with  . Medical Management of Chronic Issues    HPI: This is an 80 y.o. Male resident living at [facility] with advanced dementia. I am here to review his chronic illnesses. He is incontinent and needs assistance with ADL's. He spends most of the day in a wheelchair. There are no complaints per the staff. His weight is stable at 150.8 lbs.     Allergies  Allergen Reactions  . Codeine     REACTION: Rash,itching    MEDICATIONS -  Reviewed     Medication List       This list is accurate as of: 05/17/14  3:16 PM.  Always use your most recent med list.               ARIPiprazole 2 MG tablet  Commonly known as:  ABILIFY  Take 2 mg by mouth daily.     cholecalciferol 400 UNITS Tabs tablet  Commonly known as:  VITAMIN D  Take 1,000 Units by mouth daily.     cyanocobalamin 1000 MCG tablet  Take 100 mcg by mouth daily.     EXELON 13.3 MG/24HR Pt24  Generic drug:  Rivastigmine  Place 13.3 mg onto the skin daily.     feeding supplement (ENSURE COMPLETE) Liqd  Take 237 mLs by mouth 3 (three) times daily between meals.     HYDROcodone-acetaminophen 5-325 MG per tablet  Commonly known as:  NORCO/VICODIN  Take one tablet by mouth every morning; Take one tablet by mouth every night at bedtime for orthopedic pain     loratadine 10 MG tablet  Commonly known as:  CLARITIN  Take 10 mg by mouth daily.     Melatonin 3 MG Tabs  Take 3 mg by mouth at bedtime.     multivitamin tablet  Take 1 tablet by mouth daily.     NAMENDA XR 28 MG Cp24  Generic drug:  Memantine HCl ER  Take 28 mg by mouth daily.     sennosides-docusate sodium 8.6-50 MG tablet  Commonly known as:  SENOKOT-S  Take 2 tablets by mouth daily.     sertraline 100 MG tablet  Commonly known as:  ZOLOFT  Take 100 mg by mouth  daily.       LABS    07-13-13: urine culture: no growth 08-09-13: wbc 8.9; hgb 13.6; hct 40.7; mcv 87.7; plt 178; glucose 115; bun 20; creat 0.9; k+4.0; na++137 08-15-13: wbc 8.0; hgb 12.8; hct 40.2; mcv 90.1.; plt 205 08-17-13: glucose 104; bun 20; creat 0.7; k+4.0; na++ 139 08-29-13: wbc 7.0; hgb 13.7;hct 41.8; mcv 89.1; plt 178; glucose 91; bun 18; creat 0.8; k+4.2; na++ 139 liver normal albumin 4.3  11-15-13: chol 131; ldl 67; trig 97 01-18-14: urine culture: klebsiella pneumoniae: levaquin 03-16-14: wbc 7.5; hgb 15.4; hct 49.1; mcv 91.6; plt 195; glucose 71; bun 26; creat 1.0; k+4.4; na++142; liver normal albumin 4.3; chol 200; ldl 132; trig 110; folate >25; vit b12: 1338; vit d 28.67;  hgb a1c 5.9         Diagnostic imaging  Lumbar thoracic spine xray: mod to severe diffuse osteopenia, mld to mod OA with mod degenerative spondylosis see diffusely; no acute fracture, lytic lesion, or compression deformity  REVIEW OF SYSTEMS  Unable to obtain due to  dementia  PHYSICAL EXAM Filed Vitals:   05/17/14 1507  BP: 133/73  Pulse: 68  Temp: 98.4 F (36.9 C)  Resp: 20  Weight: 150 lb 12.8 oz (68.402 kg)  SpO2: 99%   Body mass index is 19.9 kg/(m^2). GENERAL APPEARANCE: No acute distress, appropriately groomed, frail body habitus SKIN: No diaphoresis, rash, wound HEAD: Normocephalic, atraumatic EYES: Conjunctiva/lids clear  RESPIRATORY: Breathing is even, unlabored  Lung sounds are clear and full  CARDIOVASCULAR: Heart RRR   No murmur or extra heart sounds   EDEMA: No peripheral edema   ABD: soft, non tender, non distended BS x4 MUSCULOSKELETAL.  Decreased ROM to both elbows, shoulders, and knees. Unable to test strength since pt can not f/c. Rigidity to the left elbow worse than right. PSYCHIATRIC:  Alert, confused, intermittently followed commmands   ASSESSMENT/PLAN  Mixed Alzheimer's and vascular dementia Advanced. On Exelon and Namenda and tolerating well. He is not on  ASA as he has a hx of GIB, as well as falls with subdural hematomas. Continue to monitor and provide support care.   BACK PAIN, LOW He has hx of this with arthritic pain relieved by Norco BID. Continue and monitor for side effects.   Constipation Stable on Senna S two tabs daily. He will need this as long as he is using opiates. Continue to monitor.   Unspecified vitamin D deficiency Started on supplementation in June for a level of 28.67. Will check another level in two months.  DYSLIPIDEMIA His LDL is 132 and this would not be ideal but given his frailty I would not treat this with statins.  Psychosis I believe this is related to his hx of dementia. The consultant notes indicate visual hallucinations. He is on Abilify. He has some rigidity noted to his arms. The notes indicate that there is concern that his symptoms would relapse if the Abilify was discontinued. Will continue to monitor closely.   Bard Herbert NP/Christina Wert RN, MSN  05/17/2014

## 2014-05-17 NOTE — Assessment & Plan Note (Addendum)
Advanced. On Exelon and Namenda and tolerating well. He is not on ASA as he has a hx of GIB, as well as falls with subdural hematomas. Continue to monitor and provide support care.

## 2014-05-17 NOTE — Assessment & Plan Note (Signed)
Stable on Senna S two tabs daily. He will need this as long as he is using opiates. Continue to monitor.

## 2014-05-17 NOTE — Assessment & Plan Note (Signed)
Started on supplementation in June for a level of 28.67. Will check another level in two months.

## 2014-05-17 NOTE — Assessment & Plan Note (Signed)
His LDL is 132 and this would not be ideal but given his frailty I would not treat this with statins.

## 2014-05-17 NOTE — Assessment & Plan Note (Signed)
I believe this is related to his hx of dementia. The consultant notes indicate visual hallucinations. He is on Abilify. He has some rigidity noted to his arms. The notes indicate that there is concern that his symptoms would relapse if the Abilify was discontinued. Will continue to monitor closely.

## 2014-05-17 NOTE — Assessment & Plan Note (Signed)
He has hx of this with arthritic pain relieved by Norco BID. Continue and monitor for side effects.

## 2014-06-09 ENCOUNTER — Other Ambulatory Visit: Payer: Self-pay | Admitting: *Deleted

## 2014-06-09 MED ORDER — HYDROCODONE-ACETAMINOPHEN 5-325 MG PO TABS: ORAL_TABLET | ORAL | Status: DC

## 2014-06-09 NOTE — Telephone Encounter (Signed)
[facility] 

## 2014-06-12 ENCOUNTER — Encounter: Payer: Self-pay | Admitting: Internal Medicine

## 2014-06-12 ENCOUNTER — Non-Acute Institutional Stay (SKILLED_NURSING_FACILITY): Payer: Medicare Other | Admitting: Internal Medicine

## 2014-06-12 DIAGNOSIS — F0280 Dementia in other diseases classified elsewhere without behavioral disturbance: Secondary | ICD-10-CM

## 2014-06-12 DIAGNOSIS — G309 Alzheimer's disease, unspecified: Principal | ICD-10-CM

## 2014-06-12 DIAGNOSIS — G4700 Insomnia, unspecified: Secondary | ICD-10-CM

## 2014-06-12 DIAGNOSIS — G8929 Other chronic pain: Secondary | ICD-10-CM

## 2014-06-12 DIAGNOSIS — F0150 Vascular dementia without behavioral disturbance: Secondary | ICD-10-CM

## 2014-06-12 DIAGNOSIS — K5900 Constipation, unspecified: Secondary | ICD-10-CM

## 2014-06-12 DIAGNOSIS — E559 Vitamin D deficiency, unspecified: Secondary | ICD-10-CM

## 2014-06-12 NOTE — Progress Notes (Signed)
Patient ID: [name], male   DOB: 09-10-1932, 80 y.o.   MRN: [id number]    Facility: [facility]  Chief Complaint  Patient presents with  . Medical Management of Chronic Issues   Allergies  Allergen Reactions  . Codeine     REACTION: Rash,itching   Full code  HPI 80 y/o male pt is seen today for routine visit. He has history of dementia, HTN, chronic low back pain and constipation. He is seen in his room today. He has trouble falling asleep at night. No other complaints from patient. No concerns from staff. He is on wheelchair and needs assistance with his ADLs  ROS difficult to obtain  Past Medical History  Diagnosis Date  . Anxiety state, unspecified   . Arthritis     fingers/shoulders  . Unspecified cataract   . Colon polyps     last colonoscopy 2010  . CAD (coronary artery disease) 2006    s/p CABG  . Senile dementia, uncomplicated   . Depression   . HLD (hyperlipidemia)   . Gastric ulcer, unspecified as acute or chronic, without mention of hemorrhage, perforation, or obstruction   . Hypertension   . Insomnia   . TIA (transient ischemic attack)   . Clostridium difficile infection 2012    recurrent-last treatment 10/2010  . BPH (benign prostatic hypertrophy)   . H/O: hypothyroidism   . History of chronic pancreatitis   . Alcohol abuse, in remission     sober x years  . Paroxysmal a-fib     history of  . Hx: UTI (urinary tract infection)   . Diabetes mellitus type II     history of  . ARF (acute renal failure)     history of  . History of chicken pox   . Pulmonary nodule/lesion, solitary 05/2010    53mm L lung base nodule, if high risk rec f/u with CT 1 yr, if low risk, no f/u needed  . Muscle weakness (generalized)   . Lack of coordination   . Psychosis   . Episodic mood disorder   . Extrapyramidal disorder   . Altered mental status   . ALCOHOL ABUSE, HX OF 12/24/2006    Qualifier: Diagnosis of  By: [name]  MD, [name]      . COLON POLYP 12/24/2006    Qualifier: Diagnosis of  By: [name]    . Oral lesion 02/27/2011  . Subdural hematoma, post-traumatic 08/04/2013  . Protein-calorie malnutrition, severe 08/05/2013   Medication reviewed. See MAR  Physical exam Vss, afebrile  General- elderly male in no acute distress, frail Head- atraumatic, normocephalic Eyes- PERRLA, EOMI, no pallor, no icterus Neck- no lymphadenopathy Cardiovascular- normal s1,s2, no murmurs Respiratory- bilateral clear to auscultation, no wheeze, no rhonchi, no crackles Abdomen- bowel sounds present, soft, non tender Musculoskeletal- limited ROM and on wheelchair  Neurological- alert but confused  Labs 07-13-13: urine culture: no growth 08-09-13: wbc 8.9; hgb 13.6; hct 40.7; mcv 87.7; plt 178; glucose 115; bun 20; creat 0.9; k+4.0; na++137 08-15-13: wbc 8.0; hgb 12.8; hct 40.2; mcv 90.1.; plt 205 08-17-13: glucose 104; bun 20; creat 0.7; k+4.0; na++ 139 08-29-13: wbc 7.0; hgb 13.7;hct 41.8; mcv 89.1; plt 178; glucose 91; bun 18; creat 0.8; k+4.2; na++ 139 liver normal albumin 4.3   11-15-13: chol 131; ldl 67; trig 97 01-18-14: urine culture: klebsiella pneumoniae: levaquin 03-16-14: wbc 7.5; hgb 15.4; hct 49.1; mcv 91.6; plt 195; glucose 71; bun 26; creat 1.0; k+4.4; na++142; liver  normal albumin 4.3; chol 200; ldl 132; trig 110; folate >25; vit b12: 1338; vit d 28.67;   hgb a1c 5.9    Assessment/plan  Dementia Stable, decline anticipated. Continue assistance with ADLs. Falls precautions. Continue exelon and namenda. Continue his psychosis meds  vitd def Continue vit d supplement  Insomnia Started on melatonin 3 mg qhs , reassess  Chronic pain  Continue his norco bid and monitor  Constipation With him on norco, continue senna s current regimen

## 2014-07-14 ENCOUNTER — Other Ambulatory Visit: Payer: Self-pay | Admitting: *Deleted

## 2014-07-14 MED ORDER — HYDROCODONE-ACETAMINOPHEN 5-325 MG PO TABS: ORAL_TABLET | ORAL | Status: DC

## 2014-07-14 NOTE — Telephone Encounter (Signed)
[facility] 

## 2014-07-25 ENCOUNTER — Non-Acute Institutional Stay (SKILLED_NURSING_FACILITY): Payer: Medicare Other | Admitting: Adult Health

## 2014-07-25 ENCOUNTER — Encounter: Payer: Self-pay | Admitting: Adult Health

## 2014-07-25 DIAGNOSIS — F0150 Vascular dementia without behavioral disturbance: Secondary | ICD-10-CM

## 2014-07-25 DIAGNOSIS — M545 Low back pain, unspecified: Secondary | ICD-10-CM

## 2014-07-25 DIAGNOSIS — G309 Alzheimer's disease, unspecified: Secondary | ICD-10-CM

## 2014-07-25 DIAGNOSIS — E785 Hyperlipidemia, unspecified: Secondary | ICD-10-CM

## 2014-07-25 DIAGNOSIS — F29 Unspecified psychosis not due to a substance or known physiological condition: Secondary | ICD-10-CM

## 2014-07-25 DIAGNOSIS — F0280 Dementia in other diseases classified elsewhere without behavioral disturbance: Secondary | ICD-10-CM

## 2014-07-25 DIAGNOSIS — K5900 Constipation, unspecified: Secondary | ICD-10-CM

## 2014-07-25 NOTE — Progress Notes (Signed)
[facility]  Code Status: Full  Allergies  Allergen Reactions  . Codeine     REACTION: Rash,itching    Chief Complaint: Medical Management of Chronic Health Issues  HPI:  Mr. Erway is an 80 yr old male being seen today for a routine visit.  Overall patient is doing well. Only staff concern is patient does have occasional agitation with outbursts, however he is redirected and currently not requiring medications. He cannot participate in ROS or HPI. He does not appear SOB or in any distress. There are no concerns being voiced by the nursing staff.    Review of Systems:  Unable to assess Psychiatric/Behavioral: Positive for memory loss.  The patient is not nervous/anxious.     Past Medical History  Diagnosis Date  . Anxiety state, unspecified   . Arthritis     fingers/shoulders  . Unspecified cataract   . Colon polyps     last colonoscopy 2010  . CAD (coronary artery disease) 2006    s/p CABG  . Senile dementia, uncomplicated   . Depression   . HLD (hyperlipidemia)   . Gastric ulcer, unspecified as acute or chronic, without mention of hemorrhage, perforation, or obstruction   . Hypertension   . Insomnia   . TIA (transient ischemic attack)   . Clostridium difficile infection 2012    recurrent-last treatment 10/2010  . BPH (benign prostatic hypertrophy)   . H/O: hypothyroidism   . History of chronic pancreatitis   . Alcohol abuse, in remission     sober x years  . Paroxysmal a-fib     history of  . Hx: UTI (urinary tract infection)   . Diabetes mellitus type II     history of  . ARF (acute renal failure)     history of  . History of chicken pox   . Pulmonary nodule/lesion, solitary 05/2010    71mm L lung base nodule, if high risk rec f/u with CT 1 yr, if low risk, no f/u needed  . Muscle weakness (generalized)   . Lack of coordination   . Psychosis   . Episodic mood disorder   . Extrapyramidal disorder   . Altered mental status   . ALCOHOL ABUSE, HX OF  12/24/2006    Qualifier: Diagnosis of  By: [name]  MD, [name]    . COLON POLYP 12/24/2006    Qualifier: Diagnosis of  By: [name]    . Oral lesion 02/27/2011  . Subdural hematoma, post-traumatic 08/04/2013  . Protein-calorie malnutrition, severe 08/05/2013   Past Surgical History  Procedure Laterality Date  . Coronary artery bypass graft  2006  . Injection knee  09/06/2001  . Cardiovascular stress test  05/2008    small regions of perfusion abnormality suggestive of diaphragm artifact, exercise cap 7 METs   Social History:   reports that he quit smoking about 8 years ago. He does not have any smokeless tobacco history on file. He reports that he does not drink alcohol or use illicit drugs.  Family History  Problem Relation Age of Onset  . Dementia Father     ? Alzheimers  . Heart attack Father   . Hypertension Brother   . Diabetes Brother     Medications:   Medication List       This list is accurate as of: 07/25/14  9:54 AM.  Always use your most recent med list.               ARIPiprazole 2  MG tablet  Commonly known as:  ABILIFY  Take 2 mg by mouth daily.     cholecalciferol 400 UNITS Tabs tablet  Commonly known as:  VITAMIN D  Take 1,000 Units by mouth daily.     cyanocobalamin 1000 MCG tablet  Take 100 mcg by mouth daily.     EXELON 13.3 MG/24HR Pt24  Generic drug:  Rivastigmine  Place 13.3 mg onto the skin daily.     feeding supplement (ENSURE COMPLETE) Liqd  Take 237 mLs by mouth 3 (three) times daily between meals.     HYDROcodone-acetaminophen 5-325 MG per tablet  Commonly known as:  NORCO/VICODIN  Take one tablet by mouth every morning for orthopedic pain; Take one tablet by mouth every night at bedtime for orthopedic pain     loratadine 10 MG tablet  Commonly known as:  CLARITIN  Take 10 mg by mouth daily.     Melatonin 3 MG Tabs  Take 3 mg by mouth at bedtime.     multivitamin tablet  Take 1 tablet by mouth daily.     NAMENDA XR  28 MG Cp24  Generic drug:  Memantine HCl ER  Take 28 mg by mouth daily.     sennosides-docusate sodium 8.6-50 MG tablet  Commonly known as:  SENOKOT-S  Take 2 tablets by mouth daily.     sertraline 100 MG tablet  Commonly known as:  ZOLOFT  Take 100 mg by mouth daily.          Physical Exam:  Filed Vitals:   07/25/14 0950  BP: 110/66  Pulse: 81  Temp: 98.6 F (37 C)  Resp: 21    General- elderly male in no acute distress; frail Neck- no lymphadenopathy, no thyromegaly, no jugular vein distension, no carotid bruit Cardiovascular- normal s1,s2, no murmurs/ rubs/ gallops Respiratory- bilateral clear to auscultation, no wheeze, no rhonchi, no crackles, no use of accessory muscles Abdomen- bowel sounds present, soft, non tender, no organomegaly, no abdominal bruits, no guarding or rigidity Musculoskeletal- able to move all 4 extremities, wheelchair/chair bound, max assist, decreased range of motion, no leg edema Neurological- focal deficit; 3/5 strength to extremities Skin- warm and dry Psychiatry- alert; pleasantly confused   LABS REVIEWED   08-09-13: wbc 8.9; hgb 13.6; hct 40.7; mcv 87.7; plt 178; glucose 115; bun 20; creat 0.9; k+4.0; na++137 08-15-13: wbc 8.0; hgb 12.8; hct 40.2; mcv 90.1.; plt 205 08-17-13: glucose 104; bun 20; creat 0.7; k+4.0; na++ 139 08-29-13: wbc 7.0; hgb 13.7;hct 41.8; mcv 89.1; plt 178; glucose 91; bun 18; creat 0.8; k+4.2; na++ 139 liver normal albumin 4.3   11-15-13: chol 131; ldl 67; trig 97 01-18-14: urine culture: klebsiella pneumoniae: levaquin 03-16-14: wbc 7.5; hgb 15.4; hct 49.1; mcv 91.6; plt 195; glucose 71; bun 26; creat 1.0; k+4.4; na++142; liver normal albumin 4.3; chol 200; ldl 132; trig 110; folate >25; vit b12: 1338; vit d 28.67;   hgb a1c 5.9      Assessment/Plan  1. Mixed Alzheimer's and vascular dementia: Advanced. On Exelon and Namenda and tolerating well. He is not on ASA as he has a hx of GI bleed, as well as falls with  subdural hematomas. Continue to monitor.   2. Low back pain: No issues;  pain relieved by Norco BID. Continue to monitor  3. Constipation: Stable on Senna S two tabs daily. Continue to monitor.   4. Unspecified vitamin D deficiency: Started on supplementation in June for a level of 28.67. Will check another level  and monitor  5. Dyslipidemia: stable; no on treatment at this time; last LDL was 132.  6. Psychosis:  He is currently taking Abilify 2mg  daily and Zoloft 100mg  daily, which seems to be working. He does have occasional outbursts, however staff is not concerned at this time that he needs any extra medications; Will continue to monitor. He is followed by [facility].   Westley Foots, [place] NP [facility]  Contact [phone] Monday through Friday 8am- 5pm  After hours call [phone]

## 2014-07-29 ENCOUNTER — Inpatient Hospital Stay (HOSPITAL_COMMUNITY)
Admission: EM | Admit: 2014-07-29 | Discharge: 2014-08-02 | DRG: 470 | Disposition: A | Discharge status: Skilled Nursing Facility | Payer: Medicare Other | Attending: Internal Medicine | Admitting: Internal Medicine

## 2014-07-29 ENCOUNTER — Emergency Department (HOSPITAL_COMMUNITY): Payer: Medicare Other

## 2014-07-29 ENCOUNTER — Encounter (HOSPITAL_COMMUNITY): Payer: Self-pay | Admitting: Emergency Medicine

## 2014-07-29 DIAGNOSIS — E785 Hyperlipidemia, unspecified: Secondary | ICD-10-CM | POA: Diagnosis present

## 2014-07-29 DIAGNOSIS — S51012A Laceration without foreign body of left elbow, initial encounter: Secondary | ICD-10-CM | POA: Diagnosis present

## 2014-07-29 DIAGNOSIS — F329 Major depressive disorder, single episode, unspecified: Secondary | ICD-10-CM | POA: Diagnosis present

## 2014-07-29 DIAGNOSIS — S72002D Fracture of unspecified part of neck of left femur, subsequent encounter for closed fracture with routine healing: Secondary | ICD-10-CM

## 2014-07-29 DIAGNOSIS — S72012A Unspecified intracapsular fracture of left femur, initial encounter for closed fracture: Principal | ICD-10-CM | POA: Diagnosis present

## 2014-07-29 DIAGNOSIS — F0280 Dementia in other diseases classified elsewhere without behavioral disturbance: Secondary | ICD-10-CM | POA: Diagnosis present

## 2014-07-29 DIAGNOSIS — M1990 Unspecified osteoarthritis, unspecified site: Secondary | ICD-10-CM | POA: Diagnosis present

## 2014-07-29 DIAGNOSIS — F0150 Vascular dementia without behavioral disturbance: Secondary | ICD-10-CM | POA: Diagnosis present

## 2014-07-29 DIAGNOSIS — Z6820 Body mass index (BMI) 20.0-20.9, adult: Secondary | ICD-10-CM

## 2014-07-29 DIAGNOSIS — D696 Thrombocytopenia, unspecified: Secondary | ICD-10-CM | POA: Diagnosis present

## 2014-07-29 DIAGNOSIS — I10 Essential (primary) hypertension: Secondary | ICD-10-CM | POA: Diagnosis present

## 2014-07-29 DIAGNOSIS — Z79899 Other long term (current) drug therapy: Secondary | ICD-10-CM

## 2014-07-29 DIAGNOSIS — N400 Enlarged prostate without lower urinary tract symptoms: Secondary | ICD-10-CM

## 2014-07-29 DIAGNOSIS — E46 Unspecified protein-calorie malnutrition: Secondary | ICD-10-CM | POA: Diagnosis present

## 2014-07-29 DIAGNOSIS — E119 Type 2 diabetes mellitus without complications: Secondary | ICD-10-CM | POA: Diagnosis present

## 2014-07-29 DIAGNOSIS — G4700 Insomnia, unspecified: Secondary | ICD-10-CM | POA: Diagnosis present

## 2014-07-29 DIAGNOSIS — F0393 Unspecified dementia, unspecified severity, with mood disturbance: Secondary | ICD-10-CM

## 2014-07-29 DIAGNOSIS — E43 Unspecified severe protein-calorie malnutrition: Secondary | ICD-10-CM

## 2014-07-29 DIAGNOSIS — M25552 Pain in left hip: Secondary | ICD-10-CM | POA: Diagnosis present

## 2014-07-29 DIAGNOSIS — S72002A Fracture of unspecified part of neck of left femur, initial encounter for closed fracture: Secondary | ICD-10-CM

## 2014-07-29 DIAGNOSIS — W19XXXA Unspecified fall, initial encounter: Secondary | ICD-10-CM | POA: Diagnosis present

## 2014-07-29 DIAGNOSIS — Z8673 Personal history of transient ischemic attack (TIA), and cerebral infarction without residual deficits: Secondary | ICD-10-CM | POA: Diagnosis not present

## 2014-07-29 DIAGNOSIS — Y92129 Unspecified place in nursing home as the place of occurrence of the external cause: Secondary | ICD-10-CM

## 2014-07-29 DIAGNOSIS — D500 Iron deficiency anemia secondary to blood loss (chronic): Secondary | ICD-10-CM | POA: Diagnosis not present

## 2014-07-29 DIAGNOSIS — S72009A Fracture of unspecified part of neck of unspecified femur, initial encounter for closed fracture: Secondary | ICD-10-CM | POA: Diagnosis present

## 2014-07-29 DIAGNOSIS — G309 Alzheimer's disease, unspecified: Secondary | ICD-10-CM | POA: Diagnosis present

## 2014-07-29 DIAGNOSIS — R911 Solitary pulmonary nodule: Secondary | ICD-10-CM

## 2014-07-29 DIAGNOSIS — I2510 Atherosclerotic heart disease of native coronary artery without angina pectoris: Secondary | ICD-10-CM | POA: Diagnosis present

## 2014-07-29 DIAGNOSIS — W1830XA Fall on same level, unspecified, initial encounter: Secondary | ICD-10-CM | POA: Diagnosis present

## 2014-07-29 DIAGNOSIS — Z87891 Personal history of nicotine dependence: Secondary | ICD-10-CM

## 2014-07-29 DIAGNOSIS — Z951 Presence of aortocoronary bypass graft: Secondary | ICD-10-CM | POA: Diagnosis not present

## 2014-07-29 LAB — I-STAT CHEM 8, ED
BUN: 29 mg/dL — ABNORMAL HIGH (ref 6–23)
CALCIUM ION: 1.12 mmol/L — AB (ref 1.13–1.30)
CREATININE: 1.3 mg/dL (ref 0.50–1.35)
Chloride: 107 mEq/L (ref 96–112)
Glucose, Bld: 134 mg/dL — ABNORMAL HIGH (ref 70–99)
HCT: 48 % (ref 39.0–52.0)
Hemoglobin: 16.3 g/dL (ref 13.0–17.0)
Potassium: 4.1 mEq/L (ref 3.7–5.3)
Sodium: 143 mEq/L (ref 137–147)
TCO2: 23 mmol/L (ref 0–100)

## 2014-07-29 LAB — CBC WITH DIFFERENTIAL/PLATELET
Basophils Absolute: 0 10*3/uL (ref 0.0–0.1)
Basophils Relative: 0 % (ref 0–1)
EOS PCT: 0 % (ref 0–5)
Eosinophils Absolute: 0 10*3/uL (ref 0.0–0.7)
HCT: 44.1 % (ref 39.0–52.0)
HEMOGLOBIN: 15.2 g/dL (ref 13.0–17.0)
LYMPHS ABS: 0.7 10*3/uL (ref 0.7–4.0)
Lymphocytes Relative: 4 % — ABNORMAL LOW (ref 12–46)
MCH: 29.8 pg (ref 26.0–34.0)
MCHC: 34.5 g/dL (ref 30.0–36.0)
MCV: 86.5 fL (ref 78.0–100.0)
MONO ABS: 1.7 10*3/uL — AB (ref 0.1–1.0)
Monocytes Relative: 11 % (ref 3–12)
NEUTROS ABS: 13.1 10*3/uL — AB (ref 1.7–7.7)
Neutrophils Relative %: 85 % — ABNORMAL HIGH (ref 43–77)
Platelets: 141 10*3/uL — ABNORMAL LOW (ref 150–400)
RBC: 5.1 MIL/uL (ref 4.22–5.81)
RDW: 13.4 % (ref 11.5–15.5)
WBC: 15.5 10*3/uL — AB (ref 4.0–10.5)

## 2014-07-29 LAB — CREATININE, SERUM
Creatinine, Ser: 1.17 mg/dL (ref 0.50–1.35)
GFR calc non Af Amer: 57 mL/min — ABNORMAL LOW (ref >90)
GFR, EST AFRICAN AMERICAN: 66 mL/min — AB (ref >90)

## 2014-07-29 LAB — GLUCOSE, CAPILLARY: Glucose-Capillary: 148 mg/dL — ABNORMAL HIGH (ref 70–99)

## 2014-07-29 MED ORDER — MEMANTINE HCL ER 28 MG PO CP24
28.0000 mg | ORAL_CAPSULE | Freq: Every day | ORAL | Status: DC
  Administered 2014-07-29 – 2014-08-01 (×4): 28 mg via ORAL
  Filled 2014-07-29 (×6): qty 28

## 2014-07-29 MED ORDER — LIDOCAINE-EPINEPHRINE (PF) 2 %-1:200000 IJ SOLN
10.0000 mL | Freq: Once | INTRAMUSCULAR | Status: AC
  Administered 2014-07-29: 10 mL
  Filled 2014-07-29: qty 20

## 2014-07-29 MED ORDER — HYDROCODONE-ACETAMINOPHEN 5-325 MG PO TABS
1.0000 | ORAL_TABLET | Freq: Four times a day (QID) | ORAL | Status: DC | PRN
  Administered 2014-07-29: 2 via ORAL
  Filled 2014-07-29: qty 2

## 2014-07-29 MED ORDER — RIVASTIGMINE 13.3 MG/24HR TD PT24
13.3000 mg | MEDICATED_PATCH | Freq: Every day | TRANSDERMAL | Status: DC
  Administered 2014-07-30 – 2014-08-01 (×3): 13.3 mg via TRANSDERMAL
  Filled 2014-07-29 (×6): qty 1

## 2014-07-29 MED ORDER — LORATADINE 10 MG PO TABS
10.0000 mg | ORAL_TABLET | Freq: Every day | ORAL | Status: DC
  Administered 2014-07-31 – 2014-08-02 (×3): 10 mg via ORAL
  Filled 2014-07-29 (×4): qty 1

## 2014-07-29 MED ORDER — HEPARIN SODIUM (PORCINE) 5000 UNIT/ML IJ SOLN
5000.0000 [IU] | Freq: Three times a day (TID) | INTRAMUSCULAR | Status: DC
  Administered 2014-07-30: 5000 [IU] via SUBCUTANEOUS
  Filled 2014-07-29 (×3): qty 1

## 2014-07-29 MED ORDER — MELATONIN 3 MG PO TABS: 3.0000 mg | ORAL_TABLET | Freq: Every day | ORAL | Status: DC

## 2014-07-29 MED ORDER — SODIUM CHLORIDE 0.9 % IV SOLN
INTRAVENOUS | Status: DC
  Administered 2014-07-29: via INTRAVENOUS

## 2014-07-29 MED ORDER — SERTRALINE HCL 100 MG PO TABS
100.0000 mg | ORAL_TABLET | Freq: Every day | ORAL | Status: DC
  Administered 2014-07-29 – 2014-08-02 (×4): 100 mg via ORAL
  Filled 2014-07-29 (×5): qty 1

## 2014-07-29 MED ORDER — MORPHINE SULFATE 2 MG/ML IJ SOLN
0.5000 mg | INTRAMUSCULAR | Status: DC | PRN
  Administered 2014-07-30: 0.5 mg via INTRAVENOUS
  Filled 2014-07-29: qty 1

## 2014-07-29 MED ORDER — ARIPIPRAZOLE 2 MG PO TABS
2.0000 mg | ORAL_TABLET | Freq: Every day | ORAL | Status: DC
  Administered 2014-07-29 – 2014-08-02 (×4): 2 mg via ORAL
  Filled 2014-07-29 (×5): qty 1

## 2014-07-29 MED ORDER — CEFAZOLIN SODIUM-DEXTROSE 2-3 GM-% IV SOLR
2.0000 g | INTRAVENOUS | Status: AC
  Administered 2014-07-30: 2 g via INTRAVENOUS
  Filled 2014-07-29: qty 50

## 2014-07-29 MED ORDER — CHLORHEXIDINE GLUCONATE 4 % EX LIQD
60.0000 mL | Freq: Once | CUTANEOUS | Status: AC
  Administered 2014-07-29: 4 via TOPICAL
  Filled 2014-07-29: qty 60

## 2014-07-29 NOTE — Progress Notes (Signed)
Called several family members to obtain consent. Step daughter, [name], has been his contact for [employer]. Pt's daughter is no longer involved with his care and has refused to acknowledge him. His son is in prison. Requested that his step daughter speak with the social worker to get paper work for his health care power of attorney. Step daughter also will speak with MD about his DNR status.

## 2014-07-29 NOTE — Consult Note (Signed)
Reason for Consult:  Left hip fracture Referring Physician:  Dr. Kathryne Sharper is an 80 y.o. male.  HPI:  80 y/o male with severe dementia fell today at [facility].  He has been unable to bear weight on the L LE.  He localizes pain to the L LE.  His history is by report since he is unable to provide any history.  He takes no blood thinners.  Past Medical History  Diagnosis Date  . Anxiety state, unspecified   . Arthritis     fingers/shoulders  . Unspecified cataract   . Colon polyps     last colonoscopy 2010  . CAD (coronary artery disease) 2006    s/p CABG  . Senile dementia, uncomplicated   . Depression   . HLD (hyperlipidemia)   . Gastric ulcer, unspecified as acute or chronic, without mention of hemorrhage, perforation, or obstruction   . Hypertension   . Insomnia   . TIA (transient ischemic attack)   . Clostridium difficile infection 2012    recurrent-last treatment 10/2010  . BPH (benign prostatic hypertrophy)   . H/O: hypothyroidism   . History of chronic pancreatitis   . Alcohol abuse, in remission     sober x years  . Paroxysmal a-fib     history of  . Hx: UTI (urinary tract infection)   . Diabetes mellitus type II     history of  . ARF (acute renal failure)     history of  . History of chicken pox   . Pulmonary nodule/lesion, solitary 05/2010    25mm L lung base nodule, if high risk rec f/u with CT 1 yr, if low risk, no f/u needed  . Muscle weakness (generalized)   . Lack of coordination   . Psychosis   . Episodic mood disorder   . Extrapyramidal disorder   . Altered mental status   . ALCOHOL ABUSE, HX OF 12/24/2006    Qualifier: Diagnosis of  By: [name]  MD, [name]    . COLON POLYP 12/24/2006    Qualifier: Diagnosis of  By: [name]    . Oral lesion 02/27/2011  . Subdural hematoma, post-traumatic 08/04/2013  . Protein-calorie malnutrition, severe 08/05/2013    Past Surgical History  Procedure Laterality Date  . Coronary artery  bypass graft  2006  . Injection knee  09/06/2001  . Cardiovascular stress test  05/2008    small regions of perfusion abnormality suggestive of diaphragm artifact, exercise cap 7 METs    Family History  Problem Relation Age of Onset  . Dementia Father     ? Alzheimers  . Heart attack Father   . Hypertension Brother   . Diabetes Brother     Social History:  reports that he quit smoking about 8 years ago. He does not have any smokeless tobacco history on file. He reports that he does not drink alcohol or use illicit drugs.  Allergies:  Allergies  Allergen Reactions  . Codeine     REACTION: Rash,itching    Medications: I have reviewed the patient's current medications.  Results for orders placed during the hospital encounter of 07/29/14 (from the past 48 hour(s))  CBC WITH DIFFERENTIAL     Status: Abnormal   Collection Time    07/29/14  5:00 PM      Result Value Ref Range   WBC 15.5 (*) 4.0 - 10.5 K/uL   RBC 5.10  4.22 - 5.81 MIL/uL   Hemoglobin 15.2  13.0 - 17.0 g/dL   HCT 44.1  39.0 - 52.0 %   MCV 86.5  78.0 - 100.0 fL   MCH 29.8  26.0 - 34.0 pg   MCHC 34.5  30.0 - 36.0 g/dL   RDW 13.4  11.5 - 15.5 %   Platelets 141 (*) 150 - 400 K/uL   Neutrophils Relative % 85 (*) 43 - 77 %   Neutro Abs 13.1 (*) 1.7 - 7.7 K/uL   Lymphocytes Relative 4 (*) 12 - 46 %   Lymphs Abs 0.7  0.7 - 4.0 K/uL   Monocytes Relative 11  3 - 12 %   Monocytes Absolute 1.7 (*) 0.1 - 1.0 K/uL   Eosinophils Relative 0  0 - 5 %   Eosinophils Absolute 0.0  0.0 - 0.7 K/uL   Basophils Relative 0  0 - 1 %   Basophils Absolute 0.0  0.0 - 0.1 K/uL  I-STAT CHEM 8, ED     Status: Abnormal   Collection Time    07/29/14  5:16 PM      Result Value Ref Range   Sodium 143  137 - 147 mEq/L   Potassium 4.1  3.7 - 5.3 mEq/L   Chloride 107  96 - 112 mEq/L   BUN 29 (*) 6 - 23 mg/dL   Creatinine, Ser 1.30  0.50 - 1.35 mg/dL   Glucose, Bld 134 (*) 70 - 99 mg/dL   Calcium, Ion 1.12 (*) 1.13 - 1.30 mmol/L   TCO2 23   0 - 100 mmol/L   Hemoglobin 16.3  13.0 - 17.0 g/dL   HCT 48.0  39.0 - 52.0 %    Dg Elbow Complete Left  07/29/2014   CLINICAL DATA:  fall today laceration and bleeding posterior and lateral left elbow laterally, patient uncooperative  EXAM: LEFT ELBOW - COMPLETE 3+ VIEW  COMPARISON:  None.  FINDINGS: Single lateral view submitted for interpretation. This study is not a true lateral and is therefore limited as well. No obvious fracture or dislocation. No obvious joint effusion. Mild olecranon enthesopathy.  IMPRESSION: Limited single image with no obvious acute abnormalities. Given that the study consists of only a single suboptimal image however the possibility of fracture is not excluded.   Electronically Signed   By: Skipper Cliche M.D.   On: 07/29/2014 16:36   Dg Hip Complete Left  07/29/2014   CLINICAL DATA:  Fall with left hip pain.  EXAM: LEFT HIP - COMPLETE 2+ VIEW  COMPARISON:  11/03/2010  FINDINGS: There is diffuse decreased bone mineralization. There are mild symmetric degenerative changes of the hips. There is a minimally displaced subcapital left femoral neck fracture. Left pubic rami difficult to evaluate due to patient positioning. There are degenerative changes of the spine. There is moderate fecal retention over the rectum.  IMPRESSION: Minimally displaced subcapital fracture of the left femoral neck.   Electronically Signed   By: Marin Olp M.D.   On: 07/29/2014 16:34   Dg Chest Port 1 View  07/29/2014   CLINICAL DATA:  Exam for medical clearance ; the patient is unable to provide any history; history of coronary artery disease  EXAM: PORTABLE CHEST - 1 VIEW  COMPARISON:  Portable chest x-ray of August 04, 2013  FINDINGS: The right lung is well-expanded. There is an azygos lobe anatomy. On the left there is chronic deformity of the lateral aspect of the bony thorax. There is no focal infiltrate. The heart and pulmonary vascularity within the limits  of normal. The patient has  undergone previous CABG.  IMPRESSION: There is no acute cardiopulmonary abnormality. There is chronic deformity of the lateral aspect of the bony thorax on the left.   Electronically Signed   By: David  Martinique   On: 07/29/2014 17:42    ROS:  By report no recent f/c/n/v. PE:  Blood pressure 136/94, pulse 110, temperature 98.5 F (36.9 C), temperature source Axillary, resp. rate 18, height 6' (1.829 m), weight 72.576 kg (160 lb), SpO2 93.00%. Elderly male in nad.  Alert.  Not oriented.  Not verbally communicative.  EOMI.  Resp unlabored.  L LE shortened.  Skin healthy and intact.  TTP at left hip.  5/5 strength in PF and DF of the ankle.  Sens to LT intact at the foot.  1+ dp and pt pulses.  No lymphadenopathy.  Assessment/Plan: Left femoral neck fracture - to OR in AM for left hip hemiarthroplasty.  NPO after midnight.  Attempted to phone patient's family contact listed in chart but was unable to reach them.  NWB on L LE for now.  Wylene Simmer 07/29/2014, 7:43 PM

## 2014-07-29 NOTE — ED Notes (Signed)
witnessed fall from standing position onto left side in hall of nursing home no loc hx severe dementia per medic report pt with pain to left hip/femur when area touched

## 2014-07-29 NOTE — ED Provider Notes (Signed)
CSN: [id number]     Arrival date & time 07/29/14  1244 History   First MD Initiated Contact with Patient 07/29/14 1248     Chief Complaint  Patient presents with  . Fall     (Consider location/radiation/quality/duration/timing/severity/associated sxs/prior Treatment) HPI Comments: At nursing home pt was a witnessed fall from a standing position.  No LOC or head injury.  Pt is at his baseline per facility  Patient is a 80 y.o. male presenting with fall. The history is provided by the nursing home. The history is limited by the absence of a caregiver.  Fall This is a new problem. The current episode started 1 to 2 hours ago. The problem occurs constantly. The problem has not changed since onset.   Past Medical History  Diagnosis Date  . Anxiety state, unspecified   . Arthritis     fingers/shoulders  . Unspecified cataract   . Colon polyps     last colonoscopy 2010  . CAD (coronary artery disease) 2006    s/p CABG  . Senile dementia, uncomplicated   . Depression   . HLD (hyperlipidemia)   . Gastric ulcer, unspecified as acute or chronic, without mention of hemorrhage, perforation, or obstruction   . Hypertension   . Insomnia   . TIA (transient ischemic attack)   . Clostridium difficile infection 2012    recurrent-last treatment 10/2010  . BPH (benign prostatic hypertrophy)   . H/O: hypothyroidism   . History of chronic pancreatitis   . Alcohol abuse, in remission     sober x years  . Paroxysmal a-fib     history of  . Hx: UTI (urinary tract infection)   . Diabetes mellitus type II     history of  . ARF (acute renal failure)     history of  . History of chicken pox   . Pulmonary nodule/lesion, solitary 05/2010    46mm L lung base nodule, if high risk rec f/u with CT 1 yr, if low risk, no f/u needed  . Muscle weakness (generalized)   . Lack of coordination   . Psychosis   . Episodic mood disorder   . Extrapyramidal disorder   . Altered mental status   . ALCOHOL ABUSE,  HX OF 12/24/2006    Qualifier: Diagnosis of  By: [name]  MD, [name]    . COLON POLYP 12/24/2006    Qualifier: Diagnosis of  By: [name]    . Oral lesion 02/27/2011  . Subdural hematoma, post-traumatic 08/04/2013  . Protein-calorie malnutrition, severe 08/05/2013   Past Surgical History  Procedure Laterality Date  . Coronary artery bypass graft  2006  . Injection knee  09/06/2001  . Cardiovascular stress test  05/2008    small regions of perfusion abnormality suggestive of diaphragm artifact, exercise cap 7 METs   Family History  Problem Relation Age of Onset  . Dementia Father     ? Alzheimers  . Heart attack Father   . Hypertension Brother   . Diabetes Brother    History  Substance Use Topics  . Smoking status: Former Smoker    Quit date: 10/27/2005  . Smokeless tobacco: Not on file  . Alcohol Use: No     Comment: Alcoholism-Sober-Quit x 3 years    Review of Systems  Unable to perform ROS     Allergies  Codeine  Home Medications   Prior to Admission medications   Medication Sig Start Date End Date Taking? Authorizing Provider  ARIPiprazole (ABILIFY) 2  MG tablet Take 2 mg by mouth daily.   Yes Historical Provider, MD  cholecalciferol (VITAMIN D) 1000 UNITS tablet Take 1,000 Units by mouth daily.   Yes Historical Provider, MD  HYDROcodone-acetaminophen (NORCO/VICODIN) 5-325 MG per tablet Take 1 tablet by mouth daily.   Yes Historical Provider, MD  loratadine (CLARITIN) 10 MG tablet Take 10 mg by mouth daily.   Yes Historical Provider, MD  Melatonin 3 MG TABS Take 3 mg by mouth at bedtime.   Yes Historical Provider, MD  Memantine HCl ER (NAMENDA XR) 28 MG CP24 Take 28 mg by mouth daily.   Yes Historical Provider, MD  Multiple Vitamins-Minerals (CERTAGEN PO) Take 1 tablet by mouth daily.   Yes Historical Provider, MD  Rivastigmine (EXELON) 13.3 MG/24HR PT24 Place 13.3 mg onto the skin daily.   Yes Historical Provider, MD  sennosides-docusate sodium (SENOKOT-S)  8.6-50 MG tablet Take 2 tablets by mouth daily.   Yes Historical Provider, MD  sertraline (ZOLOFT) 100 MG tablet Take 100 mg by mouth daily.   Yes Historical Provider, MD   BP 111/72  Temp(Src) 98.5 F (36.9 C) (Axillary)  Resp 16  Ht 6' (1.829 m)  Wt 160 lb (72.576 kg)  BMI 21.70 kg/m2  SpO2 95% Physical Exam  Nursing note and vitals reviewed. Constitutional: He appears well-developed and well-nourished. No distress.  HENT:  Head: Normocephalic and atraumatic.  Mouth/Throat: Oropharynx is clear and moist.  Eyes: Conjunctivae and EOM are normal. Pupils are equal, round, and reactive to light.  Neck: Normal range of motion. Neck supple. No spinous process tenderness and no muscular tenderness present.  Cardiovascular: Normal rate, regular rhythm and intact distal pulses.   No murmur heard. Pulmonary/Chest: Effort normal and breath sounds normal. No respiratory distress. He has no wheezes. He has no rales.  Abdominal: Soft. He exhibits no distension. There is no tenderness. There is no rebound and no guarding.  Musculoskeletal: He exhibits no edema.       Left elbow: He exhibits decreased range of motion and laceration. He exhibits no swelling.       Left hip: He exhibits decreased range of motion, tenderness and bony tenderness.       Arms: Slight shortening of the left hip.  No rotation  Neurological: He is alert.  Unable to tell me name, date or year  Skin: Skin is warm and dry. No rash noted. No erythema.  Psychiatric: He has a normal mood and affect. His behavior is normal.    ED Course  Procedures (including critical care time) Labs Review Labs Reviewed - No data to display  Imaging Review Dg Elbow Complete Left  07/29/2014   CLINICAL DATA:  fall today laceration and bleeding posterior and lateral left elbow laterally, patient uncooperative  EXAM: LEFT ELBOW - COMPLETE 3+ VIEW  COMPARISON:  None.  FINDINGS: Single lateral view submitted for interpretation. This study is  not a true lateral and is therefore limited as well. No obvious fracture or dislocation. No obvious joint effusion. Mild olecranon enthesopathy.  IMPRESSION: Limited single image with no obvious acute abnormalities. Given that the study consists of only a single suboptimal image however the possibility of fracture is not excluded.   Electronically Signed   By: Skipper Cliche M.D.   On: 07/29/2014 16:36   Dg Hip Complete Left  07/29/2014   CLINICAL DATA:  Fall with left hip pain.  EXAM: LEFT HIP - COMPLETE 2+ VIEW  COMPARISON:  11/03/2010  FINDINGS: There is diffuse  decreased bone mineralization. There are mild symmetric degenerative changes of the hips. There is a minimally displaced subcapital left femoral neck fracture. Left pubic rami difficult to evaluate due to patient positioning. There are degenerative changes of the spine. There is moderate fecal retention over the rectum.  IMPRESSION: Minimally displaced subcapital fracture of the left femoral neck.   Electronically Signed   By: Marin Olp M.D.   On: 07/29/2014 16:34     EKG Interpretation   Date/Time:  Saturday July 29 2014 12:59:30 EDT Ventricular Rate:  94 PR Interval:  186 QRS Duration: 96 QT Interval:  359 QTC Calculation: 449 R Axis:   57 Text Interpretation:  Sinus tachycardia Ventricular trigeminy Borderline  low voltage, extremity leads Anteroseptal infarct, old No significant  change since last tracing Confirmed by Maryan Rued  MD, Loree Fee ([id number]) on  07/29/2014 1:11:57 PM     LACERATION REPAIR Performed by: Blanchie Dessert Authorized byBlanchie Dessert Consent: Verbal consent obtained. Risks and benefits: risks, benefits and alternatives were discussed Consent given by: patient Patient identity confirmed: provided demographic data Prepped and Draped in normal sterile fashion Wound explored  Laceration Location: left elbow  Laceration Length: 4cm  No Foreign Bodies seen or palpated  Anesthesia: local  infiltration  Local anesthetic: lidocaine 2% with epinephrine  Anesthetic total: 3 ml  Irrigation method: syringe Amount of cleaning: standard  Skin closure: 4.0 vicryl  Number of sutures: 8  Technique: running  Patient tolerance: Patient tolerated the procedure well with no immediate complications.   MDM   Final diagnoses:  Hip fracture, left, closed, initial encounter    Patient with a witnessed from the nursing home from a standing position falling on his left side without LOC or syncope. On exam patient is severely demented and cannot give a history. However he does appear to have pain with movement of the left hip with slight shortening. Also patient has a laceration and pain to the left elbow. No signs of head trauma head or neck tenderness. Plain films of the left elbow and hip pending and the wound repaired as above. Tetanus Shots up to date  4:44 PM Pt found to have subcapital femur fracture and elbow film neg.  Clearance labs ordered and will discuss with ortho.  Dr. Doran Durand to see the pt.  Blanchie Dessert, MD 07/30/14 [phone]

## 2014-07-29 NOTE — H&P (Signed)
[facility] History and Physical  [name] KZS:[id number] DOB: 17-Dec-1931 DOA: 07/29/2014  Referring physician: Dr. Tomi Bamberger PCP: Blanchie Serve, MD   Chief Complaint: witnessed fall with left hip pain  HPI: [name] is a 80 y.o. male  With history of mixed Alzheimer's and vascular dementia, arthritis, h/o depression, insomnia who presented to the ED after a witnessed fall at nursing home. After the fall patient developed subsequent left hip discomfort and presented to the ED for further evaluation. X-rays of left hip would show subcapital fracture the left femoral neck. History is obtained from EMR as patient has advanced dementia and is unable to provide significant history.  We were subsequently consulted for medical recommendations for hip fracture and ED physician has consulted orthopedic surgery.  Review of Systems:  Unable to obtain secondary to advanced dementia  Past Medical History  Diagnosis Date  . Anxiety state, unspecified   . Arthritis     fingers/shoulders  . Unspecified cataract   . Colon polyps     last colonoscopy 2010  . CAD (coronary artery disease) 2006    s/p CABG  . Senile dementia, uncomplicated   . Depression   . HLD (hyperlipidemia)   . Gastric ulcer, unspecified as acute or chronic, without mention of hemorrhage, perforation, or obstruction   . Hypertension   . Insomnia   . TIA (transient ischemic attack)   . Clostridium difficile infection 2012    recurrent-last treatment 10/2010  . BPH (benign prostatic hypertrophy)   . H/O: hypothyroidism   . History of chronic pancreatitis   . Alcohol abuse, in remission     sober x years  . Paroxysmal a-fib     history of  . Hx: UTI (urinary tract infection)   . Diabetes mellitus type II     history of  . ARF (acute renal failure)     history of  . History of chicken pox   . Pulmonary nodule/lesion, solitary 05/2010    18mm L lung base nodule, if high risk rec f/u with CT 1 yr, if low  risk, no f/u needed  . Muscle weakness (generalized)   . Lack of coordination   . Psychosis   . Episodic mood disorder   . Extrapyramidal disorder   . Altered mental status   . ALCOHOL ABUSE, HX OF 12/24/2006    Qualifier: Diagnosis of  By: [name]  MD, [name]    . COLON POLYP 12/24/2006    Qualifier: Diagnosis of  By: [name]    . Oral lesion 02/27/2011  . Subdural hematoma, post-traumatic 08/04/2013  . Protein-calorie malnutrition, severe 08/05/2013   Past Surgical History  Procedure Laterality Date  . Coronary artery bypass graft  2006  . Injection knee  09/06/2001  . Cardiovascular stress test  05/2008    small regions of perfusion abnormality suggestive of diaphragm artifact, exercise cap 7 METs   Social History:  reports that he quit smoking about 8 years ago. He does not have any smokeless tobacco history on file. He reports that he does not drink alcohol or use illicit drugs.  Allergies  Allergen Reactions  . Codeine     REACTION: Rash,itching    Family History  Problem Relation Age of Onset  . Dementia Father     ? Alzheimers  . Heart attack Father   . Hypertension Brother   . Diabetes Brother      Prior to Admission medications   Medication Sig Start Date End Date  Taking? Authorizing Provider  ARIPiprazole (ABILIFY) 2 MG tablet Take 2 mg by mouth daily.   Yes Historical Provider, MD  cholecalciferol (VITAMIN D) 1000 UNITS tablet Take 1,000 Units by mouth daily.   Yes Historical Provider, MD  HYDROcodone-acetaminophen (NORCO/VICODIN) 5-325 MG per tablet Take 1 tablet by mouth daily.   Yes Historical Provider, MD  loratadine (CLARITIN) 10 MG tablet Take 10 mg by mouth daily.   Yes Historical Provider, MD  Melatonin 3 MG TABS Take 3 mg by mouth at bedtime.   Yes Historical Provider, MD  Memantine HCl ER (NAMENDA XR) 28 MG CP24 Take 28 mg by mouth daily.   Yes Historical Provider, MD  Multiple Vitamins-Minerals (CERTAGEN PO) Take 1 tablet by mouth daily.    Yes Historical Provider, MD  Rivastigmine (EXELON) 13.3 MG/24HR PT24 Place 13.3 mg onto the skin daily.   Yes Historical Provider, MD  sennosides-docusate sodium (SENOKOT-S) 8.6-50 MG tablet Take 2 tablets by mouth daily.   Yes Historical Provider, MD  sertraline (ZOLOFT) 100 MG tablet Take 100 mg by mouth daily.   Yes Historical Provider, MD   Physical Exam: Filed Vitals:   07/29/14 1700 07/29/14 1715 07/29/14 1730 07/29/14 1745  BP: 112/81 89/72 114/60 138/92  Pulse: 60 89 116 120  Temp:      TempSrc:      Resp: 17 23 20 24   Height:      Weight:      SpO2: 94% 94% 93% 94%    Wt Readings from Last 3 Encounters:  07/29/14 72.576 kg (160 lb)  05/17/14 68.402 kg (150 lb 12.8 oz)  04/18/14 67.677 kg (149 lb 3.2 oz)    General:  Appears calm and comfortable, in nad Eyes: PERRL, normal lids, irises & conjunctiva ENT: grossly normal hearing, lips & tongue Neck: no LAD, masses or thyromegaly Cardiovascular: normal s1 and s2, no murmurs Respiratory: CTA bilaterally, no w/r/r. Normal respiratory effort. Abdomen: soft, nt, nd Skin: no rash or induration seen on limited exam Musculoskeletal: discomfort on palpation of left hip Psychiatric: Unable to accurately assess secondary to dementia Neurologic: No facial asymmetry          Labs on Admission:  Basic Metabolic Panel:  Recent Labs Lab 07/29/14 1716  NA 143  K 4.1  CL 107  GLUCOSE 134*  BUN 29*  CREATININE 1.30   Liver Function Tests: No results found for this basename: AST, ALT, ALKPHOS, BILITOT, PROT, ALBUMIN,  in the last 168 hours No results found for this basename: LIPASE, AMYLASE,  in the last 168 hours No results found for this basename: AMMONIA,  in the last 168 hours CBC:  Recent Labs Lab 07/29/14 1700 07/29/14 1716  WBC 15.5*  --   NEUTROABS 13.1*  --   HGB 15.2 16.3  HCT 44.1 48.0  MCV 86.5  --   PLT 141*  --    Cardiac Enzymes: No results found for this basename: CKTOTAL, CKMB, CKMBINDEX,  TROPONINI,  in the last 168 hours  BNP (last 3 results)  Recent Labs  08/04/13 1640  PROBNP 329.1   CBG: No results found for this basename: GLUCAP,  in the last 168 hours  Radiological Exams on Admission: Dg Elbow Complete Left  07/29/2014   CLINICAL DATA:  fall today laceration and bleeding posterior and lateral left elbow laterally, patient uncooperative  EXAM: LEFT ELBOW - COMPLETE 3+ VIEW  COMPARISON:  None.  FINDINGS: Single lateral view submitted for interpretation. This study is not a true  lateral and is therefore limited as well. No obvious fracture or dislocation. No obvious joint effusion. Mild olecranon enthesopathy.  IMPRESSION: Limited single image with no obvious acute abnormalities. Given that the study consists of only a single suboptimal image however the possibility of fracture is not excluded.   Electronically Signed   By: Skipper Cliche M.D.   On: 07/29/2014 16:36   Dg Hip Complete Left  07/29/2014   CLINICAL DATA:  Fall with left hip pain.  EXAM: LEFT HIP - COMPLETE 2+ VIEW  COMPARISON:  11/03/2010  FINDINGS: There is diffuse decreased bone mineralization. There are mild symmetric degenerative changes of the hips. There is a minimally displaced subcapital left femoral neck fracture. Left pubic rami difficult to evaluate due to patient positioning. There are degenerative changes of the spine. There is moderate fecal retention over the rectum.  IMPRESSION: Minimally displaced subcapital fracture of the left femoral neck.   Electronically Signed   By: Marin Olp M.D.   On: 07/29/2014 16:34   Dg Chest Port 1 View  07/29/2014   CLINICAL DATA:  Exam for medical clearance ; the patient is unable to provide any history; history of coronary artery disease  EXAM: PORTABLE CHEST - 1 VIEW  COMPARISON:  Portable chest x-ray of August 04, 2013  FINDINGS: The right lung is well-expanded. There is an azygos lobe anatomy. On the left there is chronic deformity of the lateral aspect of  the bony thorax. There is no focal infiltrate. The heart and pulmonary vascularity within the limits of normal. The patient has undergone previous CABG.  IMPRESSION: There is no acute cardiopulmonary abnormality. There is chronic deformity of the lateral aspect of the bony thorax on the left.   Electronically Signed   By: David  Martinique   On: 07/29/2014 17:42    EKG: Independently reviewed. Sinus rhythm with no ST elevations or depressions  Assessment/Plan   Hip fracture requiring operative repair/Subcapital fracture of left hip - Orthopedic surgeons consulted by ED physicians who will evaluate patient further make further recommendations. - Will place routine hip fracture order set - Cleared for operation at moderate to high risk  Active Problems:   Mixed Alzheimer's and vascular dementia - Continue home regimen   Code Status: Full code based on prior CODE STATUS DVT Prophylaxis: Heparin subcutaneous Family Communication: None at bedside  Disposition Plan: MedSurg  Time spent: > 40 minutes  Velvet Bathe [facility] Pager [phone]

## 2014-07-30 ENCOUNTER — Inpatient Hospital Stay (HOSPITAL_COMMUNITY): Payer: Medicare Other

## 2014-07-30 ENCOUNTER — Inpatient Hospital Stay (HOSPITAL_COMMUNITY): Payer: Medicare Other | Admitting: Anesthesiology

## 2014-07-30 ENCOUNTER — Encounter (HOSPITAL_COMMUNITY): Payer: Self-pay | Admitting: Anesthesiology

## 2014-07-30 ENCOUNTER — Encounter (HOSPITAL_COMMUNITY): Payer: Medicare Other | Admitting: Anesthesiology

## 2014-07-30 ENCOUNTER — Encounter (HOSPITAL_COMMUNITY)
Admission: EM | Disposition: A | Discharge status: Skilled Nursing Facility | Payer: Self-pay | Source: Home / Self Care | Attending: Internal Medicine

## 2014-07-30 HISTORY — PX: HIP ARTHROPLASTY: SHX981

## 2014-07-30 LAB — GLUCOSE, CAPILLARY
GLUCOSE-CAPILLARY: 135 mg/dL — AB (ref 70–99)
Glucose-Capillary: 138 mg/dL — ABNORMAL HIGH (ref 70–99)
Glucose-Capillary: 124 mg/dL — ABNORMAL HIGH (ref 70–99)
Glucose-Capillary: 172 mg/dL — ABNORMAL HIGH (ref 70–99)

## 2014-07-30 LAB — SURGICAL PCR SCREEN
MRSA, PCR: NEGATIVE
Staphylococcus aureus: NEGATIVE

## 2014-07-30 LAB — CBC
HCT: 35.6 % — ABNORMAL LOW (ref 39.0–52.0)
HEMOGLOBIN: 12.1 g/dL — AB (ref 13.0–17.0)
MCH: 29.7 pg (ref 26.0–34.0)
MCHC: 34 g/dL (ref 30.0–36.0)
MCV: 87.5 fL (ref 78.0–100.0)
Platelets: 105 10*3/uL — ABNORMAL LOW (ref 150–400)
RBC: 4.07 MIL/uL — AB (ref 4.22–5.81)
RDW: 13.8 % (ref 11.5–15.5)
WBC: 13.3 10*3/uL — AB (ref 4.0–10.5)

## 2014-07-30 SURGERY — HEMIARTHROPLASTY, HIP, DIRECT ANTERIOR APPROACH, FOR FRACTURE
Anesthesia: General | Site: Hip | Laterality: Left

## 2014-07-30 MED ORDER — EPHEDRINE SULFATE 50 MG/ML IJ SOLN
INTRAMUSCULAR | Status: AC
  Filled 2014-07-30: qty 1

## 2014-07-30 MED ORDER — FENTANYL CITRATE 0.05 MG/ML IJ SOLN
INTRAMUSCULAR | Status: DC | PRN
  Administered 2014-07-30 (×2): 50 ug via INTRAVENOUS

## 2014-07-30 MED ORDER — DOCUSATE SODIUM 100 MG PO CAPS: 100.0000 mg | ORAL_CAPSULE | Freq: Two times a day (BID) | ORAL | Status: DC | PRN

## 2014-07-30 MED ORDER — PHENOL 1.4 % MT LIQD: 1.0000 | OROMUCOSAL | Status: DC | PRN

## 2014-07-30 MED ORDER — HYDROCODONE-ACETAMINOPHEN 5-325 MG PO TABS
1.0000 | ORAL_TABLET | Freq: Four times a day (QID) | ORAL | Status: DC | PRN
  Administered 2014-07-30 – 2014-07-31 (×3): 2 via ORAL
  Filled 2014-07-30 (×2): qty 2

## 2014-07-30 MED ORDER — GLYCOPYRROLATE 0.2 MG/ML IJ SOLN
INTRAMUSCULAR | Status: DC | PRN
  Administered 2014-07-30: 0.4 mg via INTRAVENOUS

## 2014-07-30 MED ORDER — KCL IN DEXTROSE-NACL 20-5-0.45 MEQ/L-%-% IV SOLN
INTRAVENOUS | Status: AC
  Administered 2014-07-30: 15:00:00 via INTRAVENOUS
  Filled 2014-07-30 (×2): qty 1000

## 2014-07-30 MED ORDER — MORPHINE SULFATE 2 MG/ML IJ SOLN: 0.5000 mg | INTRAMUSCULAR | Status: DC | PRN

## 2014-07-30 MED ORDER — CEFAZOLIN SODIUM 1-5 GM-% IV SOLN
1.0000 g | Freq: Four times a day (QID) | INTRAVENOUS | Status: AC
  Administered 2014-07-30 – 2014-07-31 (×3): 1 g via INTRAVENOUS
  Filled 2014-07-30 (×3): qty 50

## 2014-07-30 MED ORDER — FENTANYL CITRATE 0.05 MG/ML IJ SOLN
INTRAMUSCULAR | Status: AC
  Filled 2014-07-30: qty 5

## 2014-07-30 MED ORDER — ARTIFICIAL TEARS OP OINT
TOPICAL_OINTMENT | OPHTHALMIC | Status: DC | PRN
  Administered 2014-07-30: 1 via OPHTHALMIC

## 2014-07-30 MED ORDER — SODIUM CHLORIDE 0.9 % IR SOLN
Status: DC | PRN
  Administered 2014-07-30: 1000 mL

## 2014-07-30 MED ORDER — METOCLOPRAMIDE HCL 5 MG/ML IJ SOLN: 5.0000 mg | Freq: Three times a day (TID) | INTRAMUSCULAR | Status: DC | PRN

## 2014-07-30 MED ORDER — NEOSTIGMINE METHYLSULFATE 10 MG/10ML IV SOLN
INTRAVENOUS | Status: DC | PRN
  Administered 2014-07-30: 3 mg via INTRAVENOUS

## 2014-07-30 MED ORDER — PROPOFOL 10 MG/ML IV BOLUS
INTRAVENOUS | Status: DC | PRN
Start: 2014-07-30 — End: 2014-07-30
  Administered 2014-07-30: 100 mg via INTRAVENOUS

## 2014-07-30 MED ORDER — LIDOCAINE HCL (CARDIAC) 20 MG/ML IV SOLN
INTRAVENOUS | Status: AC
Start: 2014-07-30 — End: 2014-07-30
  Filled 2014-07-30: qty 5

## 2014-07-30 MED ORDER — HYDROCODONE-ACETAMINOPHEN 5-325 MG PO TABS: 1.0000 | ORAL_TABLET | ORAL | Status: DC | PRN

## 2014-07-30 MED ORDER — HYDROCODONE-ACETAMINOPHEN 5-325 MG PO TABS
ORAL_TABLET | ORAL | Status: AC
  Administered 2014-07-30: 2 via ORAL
  Filled 2014-07-30: qty 2

## 2014-07-30 MED ORDER — SODIUM CHLORIDE 0.9 % IJ SOLN
INTRAMUSCULAR | Status: AC
  Filled 2014-07-30: qty 10

## 2014-07-30 MED ORDER — FENTANYL CITRATE 0.05 MG/ML IJ SOLN: 25.0000 ug | INTRAMUSCULAR | Status: DC | PRN

## 2014-07-30 MED ORDER — ONDANSETRON HCL 4 MG PO TABS: 4.0000 mg | ORAL_TABLET | Freq: Four times a day (QID) | ORAL | Status: DC | PRN

## 2014-07-30 MED ORDER — LIDOCAINE HCL (CARDIAC) 20 MG/ML IV SOLN
INTRAVENOUS | Status: DC | PRN
  Administered 2014-07-30: 20 mg via INTRAVENOUS

## 2014-07-30 MED ORDER — METOCLOPRAMIDE HCL 10 MG PO TABS: 5.0000 mg | ORAL_TABLET | Freq: Three times a day (TID) | ORAL | Status: DC | PRN

## 2014-07-30 MED ORDER — PHENYLEPHRINE HCL 10 MG/ML IJ SOLN
INTRAMUSCULAR | Status: DC | PRN
  Administered 2014-07-30 (×5): 80 ug via INTRAVENOUS

## 2014-07-30 MED ORDER — ALBUMIN HUMAN 5 % IV SOLN
INTRAVENOUS | Status: DC | PRN
  Administered 2014-07-30 (×2): via INTRAVENOUS

## 2014-07-30 MED ORDER — ACETAMINOPHEN 325 MG PO TABS: 650.0000 mg | ORAL_TABLET | Freq: Four times a day (QID) | ORAL | Status: DC | PRN

## 2014-07-30 MED ORDER — ENOXAPARIN SODIUM 30 MG/0.3ML ~~LOC~~ SOLN: 30.0000 mg | SUBCUTANEOUS | Status: DC

## 2014-07-30 MED ORDER — ENOXAPARIN SODIUM 30 MG/0.3ML ~~LOC~~ SOLN
30.0000 mg | SUBCUTANEOUS | Status: DC
  Administered 2014-07-31 – 2014-08-02 (×3): 30 mg via SUBCUTANEOUS
  Filled 2014-07-30 (×4): qty 0.3

## 2014-07-30 MED ORDER — ACETAMINOPHEN 650 MG RE SUPP: 650.0000 mg | Freq: Four times a day (QID) | RECTAL | Status: DC | PRN

## 2014-07-30 MED ORDER — SODIUM CHLORIDE 0.9 % IV SOLN
10.0000 mg | INTRAVENOUS | Status: DC | PRN
  Administered 2014-07-30: 20 ug/min via INTRAVENOUS

## 2014-07-30 MED ORDER — ONDANSETRON HCL 4 MG/2ML IJ SOLN
INTRAMUSCULAR | Status: AC
  Filled 2014-07-30: qty 2

## 2014-07-30 MED ORDER — SUCCINYLCHOLINE CHLORIDE 20 MG/ML IJ SOLN
INTRAMUSCULAR | Status: DC | PRN
  Administered 2014-07-30: 80 mg via INTRAVENOUS

## 2014-07-30 MED ORDER — MENTHOL 3 MG MT LOZG: 1.0000 | LOZENGE | OROMUCOSAL | Status: DC | PRN

## 2014-07-30 MED ORDER — ONDANSETRON HCL 4 MG/2ML IJ SOLN: 4.0000 mg | Freq: Four times a day (QID) | INTRAMUSCULAR | Status: DC | PRN

## 2014-07-30 MED ORDER — INSULIN ASPART 100 UNIT/ML ~~LOC~~ SOLN
0.0000 [IU] | Freq: Three times a day (TID) | SUBCUTANEOUS | Status: DC
  Administered 2014-07-30: 1 [IU] via SUBCUTANEOUS
  Administered 2014-07-31 – 2014-08-01 (×2): 2 [IU] via SUBCUTANEOUS
  Administered 2014-08-01: 3 [IU] via SUBCUTANEOUS

## 2014-07-30 MED ORDER — MORPHINE SULFATE 2 MG/ML IJ SOLN
0.5000 mg | INTRAMUSCULAR | Status: DC | PRN
  Administered 2014-07-30: 0.5 mg via INTRAVENOUS
  Filled 2014-07-30: qty 1

## 2014-07-30 MED ORDER — ONDANSETRON HCL 4 MG/2ML IJ SOLN
INTRAMUSCULAR | Status: DC | PRN
  Administered 2014-07-30: 4 mg via INTRAVENOUS

## 2014-07-30 MED ORDER — INFLUENZA VAC SPLIT QUAD 0.5 ML IM SUSY
0.5000 mL | PREFILLED_SYRINGE | INTRAMUSCULAR | Status: AC
  Administered 2014-07-31: 0.5 mL via INTRAMUSCULAR
  Filled 2014-07-30: qty 0.5

## 2014-07-30 MED ORDER — ROCURONIUM BROMIDE 100 MG/10ML IV SOLN
INTRAVENOUS | Status: DC | PRN
  Administered 2014-07-30: 5 mg via INTRAVENOUS
  Administered 2014-07-30: 30 mg via INTRAVENOUS

## 2014-07-30 MED ORDER — LACTATED RINGERS IV SOLN
INTRAVENOUS | Status: DC | PRN
  Administered 2014-07-30: 10:00:00 via INTRAVENOUS

## 2014-07-30 MED ORDER — PROPOFOL 10 MG/ML IV BOLUS
INTRAVENOUS | Status: AC
  Filled 2014-07-30: qty 20

## 2014-07-30 SURGICAL SUPPLY — 64 items
APL SKNCLS STERI-STRIP NONHPOA (GAUZE/BANDAGES/DRESSINGS) ×1
BENZOIN TINCTURE PRP APPL 2/3 (GAUZE/BANDAGES/DRESSINGS) ×3 IMPLANT
BLADE SAW SAG 73X25 THK (BLADE) ×2
BLADE SAW SGTL 73X25 THK (BLADE) ×1 IMPLANT
BNDG ADH 5X4 AIR PERM ELC (GAUZE/BANDAGES/DRESSINGS) ×1
BNDG COHESIVE 4X5 WHT NS (GAUZE/BANDAGES/DRESSINGS) ×3 IMPLANT
BRUSH FEMORAL CANAL (MISCELLANEOUS) IMPLANT
CAPT HIP HD POR BIPOL/UNIPOL ×2 IMPLANT
CEMENT BONE SIMPLEX SPEEDSET (Cement) ×4 IMPLANT
COVER BACK TABLE 24X17X13 BIG (DRAPES) ×2 IMPLANT
DRAPE INCISE IOBAN 66X45 STRL (DRAPES) ×2 IMPLANT
DRAPE ORTHO SPLIT 77X108 STRL (DRAPES) ×6
DRAPE SURG ORHT 6 SPLT 77X108 (DRAPES) ×2 IMPLANT
DRAPE U-SHAPE 47X51 STRL (DRAPES) ×3 IMPLANT
DRSG ADAPTIC 3X8 NADH LF (GAUZE/BANDAGES/DRESSINGS) ×3 IMPLANT
DRSG PAD ABDOMINAL 8X10 ST (GAUZE/BANDAGES/DRESSINGS) ×3 IMPLANT
DURAPREP 26ML APPLICATOR (WOUND CARE) ×3 IMPLANT
ELECT BLADE 6.5 EXT (BLADE) ×2 IMPLANT
ELECT CAUTERY BLADE 6.4 (BLADE) ×3 IMPLANT
ELECT REM PT RETURN 9FT ADLT (ELECTROSURGICAL) ×3
ELECTRODE REM PT RTRN 9FT ADLT (ELECTROSURGICAL) ×1 IMPLANT
EVACUATOR 1/8 PVC DRAIN (DRAIN) IMPLANT
FILTER STRAW FLUID ASPIR (MISCELLANEOUS) ×3 IMPLANT
GAUZE SPONGE 4X4 12PLY STRL (GAUZE/BANDAGES/DRESSINGS) ×3 IMPLANT
GLOVE BIO SURGEON STRL SZ 6.5 (GLOVE) ×2 IMPLANT
GLOVE BIO SURGEONS STRL SZ 6.5 (GLOVE) ×1
GLOVE BIOGEL PI IND STRL 6.5 (GLOVE) ×1 IMPLANT
GLOVE BIOGEL PI INDICATOR 6.5 (GLOVE) ×2
GLOVE SURG SS PI 8.0 STRL IVOR (GLOVE) ×6 IMPLANT
GOWN EXTRA PROTECTION XL (GOWNS) ×3 IMPLANT
GOWN STRL REUS W/ TWL LRG LVL3 (GOWN DISPOSABLE) ×2 IMPLANT
GOWN STRL REUS W/ TWL XL LVL3 (GOWN DISPOSABLE) IMPLANT
GOWN STRL REUS W/TWL LRG LVL3 (GOWN DISPOSABLE) ×6
GOWN STRL REUS W/TWL XL LVL3 (GOWN DISPOSABLE)
HANDPIECE INTERPULSE COAX TIP (DISPOSABLE)
IMMOBILIZER KNEE 22 UNIV (SOFTGOODS) ×3 IMPLANT
KIT BASIN OR (CUSTOM PROCEDURE TRAY) ×3 IMPLANT
KIT ROOM TURNOVER OR (KITS) ×3 IMPLANT
MANIFOLD NEPTUNE II (INSTRUMENTS) ×3 IMPLANT
NDL MAYO TROCAR (NEEDLE) ×1 IMPLANT
NEEDLE MAYO TROCAR (NEEDLE) ×3 IMPLANT
NS IRRIG 1000ML POUR BTL (IV SOLUTION) ×3 IMPLANT
PACK TOTAL JOINT (CUSTOM PROCEDURE TRAY) ×3 IMPLANT
PAD ARMBOARD 7.5X6 YLW CONV (MISCELLANEOUS) ×6 IMPLANT
SET HNDPC FAN SPRY TIP SCT (DISPOSABLE) IMPLANT
SPONGE GAUZE 4X4 12PLY STER LF (GAUZE/BANDAGES/DRESSINGS) ×2 IMPLANT
SPONGE LAP 4X18 X RAY DECT (DISPOSABLE) ×6 IMPLANT
STAPLER VISISTAT (STAPLE) ×2 IMPLANT
SUCTION FRAZIER TIP 10 FR DISP (SUCTIONS) ×3 IMPLANT
SUT ETHIBOND NAB CT1 #1 30IN (SUTURE) ×12 IMPLANT
SUT VIC AB 0 CTXB 36 (SUTURE) ×6 IMPLANT
SUT VIC AB 1 CTX 36 (SUTURE) ×6
SUT VIC AB 1 CTX36XBRD ANBCTR (SUTURE) ×2 IMPLANT
SUT VIC AB 2-0 CT1 27 (SUTURE) ×3
SUT VIC AB 2-0 CT1 TAPERPNT 27 (SUTURE) IMPLANT
SUT VIC AB 2-0 CTB1 (SUTURE) ×12 IMPLANT
TAPE CLOTH 4X10 WHT NS (GAUZE/BANDAGES/DRESSINGS) ×2 IMPLANT
TOWEL OR 17X24 6PK STRL BLUE (TOWEL DISPOSABLE) ×3 IMPLANT
TOWEL OR 17X26 10 PK STRL BLUE (TOWEL DISPOSABLE) ×3 IMPLANT
TOWER CARTRIDGE SMART MIX (DISPOSABLE) IMPLANT
TRAY FOLEY CATH 16FRSI W/METER (SET/KITS/TRAYS/PACK) IMPLANT
TUBE CONNECTING 12'X1/4 (SUCTIONS) ×1
TUBE CONNECTING 12X1/4 (SUCTIONS) ×2 IMPLANT
WATER STERILE IRR 1000ML POUR (IV SOLUTION) ×9 IMPLANT

## 2014-07-30 NOTE — Anesthesia Preprocedure Evaluation (Signed)
Anesthesia Evaluation  Patient identified by MRN, date of birth, ID band Patient confused and Patient unresponsive    Reviewed: Allergy & Precautions, Patient's Chart, lab work & pertinent test results  Airway Mallampati: II      Dental  (+) Teeth Intact   Pulmonary former smoker,    + decreased breath sounds      Cardiovascular hypertension, Rhythm:Irregular     Neuro/Psych    GI/Hepatic   Endo/Other  diabetes  Renal/GU      Musculoskeletal   Abdominal   Peds  Hematology   Anesthesia Other Findings   Reproductive/Obstetrics                           Anesthesia Physical Anesthesia Plan  ASA: III  Anesthesia Plan: General   Post-op Pain Management:    Induction: Intravenous  Airway Management Planned: Oral ETT  Additional Equipment:   Intra-op Plan:   Post-operative Plan:   Informed Consent: I have reviewed the patients History and Physical, chart, labs and discussed the procedure including the risks, benefits and alternatives for the proposed anesthesia with the patient or authorized representative who has indicated his/her understanding and acceptance.     Plan Discussed with: CRNA and Anesthesiologist  Anesthesia Plan Comments: (L. Femoral neck fracture Severe dementia in NH H/O small bilateral subdural hematomas 07/2013 CAD S/P CABG 2006 Htn H/O paroxysmal AFib,  H/O alcohol abuse  Plan GA with oral ETT  Roberts Gaudy )        Anesthesia Quick Evaluation

## 2014-07-30 NOTE — Brief Op Note (Signed)
07/29/2014 - 07/30/2014  11:39 AM  PATIENT:  [name]  80 y.o. male  PRE-OPERATIVE DIAGNOSIS:  left femoral neck fracture  POST-OPERATIVE DIAGNOSIS:  left femoral neck fracture  PROCEDURE:  Procedure(s): ARTHROPLASTY BIPOLAR HIP (Left)  SURGEON:  Surgeon(s) and Role:    * Johnn Hai, MD - Primary  PHYSICIAN ASSISTANT:   ASSISTANTS: Bissell   ANESTHESIA:   general  EBL:  Total I/O In: 850 [I.V.:600; IV Piggyback:250] Out: 250 [Blood:250]  BLOOD ADMINISTERED:none  DRAINS: none   LOCAL MEDICATIONS USED:  NONE  SPECIMEN:  No Specimen  DISPOSITION OF SPECIMEN:  N/A  COUNTS:  YES  TOURNIQUET:  * No tourniquets in log *  DICTATION: .Other Dictation: Dictation Number [id number]  PLAN OF CARE: Admit to inpatient   PATIENT DISPOSITION:  PACU - hemodynamically stable.   Delay start of Pharmacological VTE agent (>24hrs) due to surgical blood loss or risk of bleeding: no

## 2014-07-30 NOTE — Op Note (Signed)
NAMEALECSANDER, HATTABAUGH NO.:  [id number]  MEDICAL RECORD NO.:  [id number]  LOCATION:  5N15C                        FACILITY:  [facility]  PHYSICIAN:  Susa Day, M.D.    DATE OF BIRTH:  [birth year]  DATE OF PROCEDURE:  07/30/2014 DATE OF DISCHARGE:                              OPERATIVE REPORT   PREOPERATIVE DIAGNOSIS:  Left displaced femoral neck fracture.  POSTOPERATIVE DIAGNOSIS:  Left displaced femoral neck fracture.  PROCEDURE PERFORMED:  Left hip hemiarthroplasty DePuy 18 femur 54 monopolar.  ANESTHESIA:  General.  ASSISTANT:  Cleophas Dunker, PA.  HISTORY:  This is an 80 year old fractured, unable walk, pain, indicated for replacement.  The patient nursing home ambulator, demented.  After the consent was obtained from his family, the preceding risks and benefits were discussed including bleeding, infection, DVT, PE, anesthetic complications, etc.  TECHNIQUE:  With the patient in supine position, after induction of adequate general anesthesia, 2 g Kefzol, left lower extremity was prepped and draped in usual sterile fashion.  Prior to this, he was placed in a right lateral decubitus position.  Hip holder was utilized. Standard posterolateral approach to the hip was made with incision based on the trochanter, subcutaneous tissue was dissected.  Electrocautery was utilized to achieve hemostasis.  Fascia lata was identified and divided in line with the skin incision.  Adductor tenotomy performed. External rotators identified.  Piriformis tagged, reflected, detached, reflected posteriorly protecting the sciatic nerve throughout the case. T-shaped capsulotomy was performed.  The patient had some generalized bleeding throughout the case.  We used the meticulous hemostasis though throughout the case electrocautery, compression, irrigation.  Fractured hematoma was expressed.  The patient had a dose of heparin yesterday.  T- shaped capsulotomy was performed.   Fracture identified.  Again hematoma expressed.  Hip was dislocated gently, taking care not to injure the femur.  The head detached from the remainder of the external rotators. We tagged the capsule, oscillating saw performed an osteotomy one fingerbreadth above the lesser trochanter.  We entered the canal with a hand reamer and a box chisel and I hand reamed to 16.5 in diameter.  The patient had a very capacious canal noted on the x-ray.  I gently broached from 10.5 to 16.5, still had some rotational instability, therefore used an 18 and very gently after maximum relaxation was able to seat the prosthesis.  Then, removed the head, it was measured at 54. I removed and debrided the acetabulum, cauterized any vessels.  Then in the appropriate version after irrigation impacted the AML prosthesis 18 narrow without any difficulty at all.  I then put a +1.5 head and the monopolar assembly and impacted it, reduced it and found to be stable throughout a full range.  The femur was stable, good leg lengths.  No dislocation.  Again, the capsule was repaired with 1 Ethibond and cautery was utilized.  We irrigated throughout, closed the tenotomy with 1 Ethibond in the fascia with 1 Ethibond and running V lock subcu with 2- 0 and skin with staples.  Wound was dressed sterilely, placed supine on the hospital bed, leg lengths were equivalent.  Good pulses.  The knee immobilizer placed, extubated, and transported  to the recovery room in satisfactory condition.  The patient tolerated the procedure well.  No complications.  Blood loss was at 500 mL.  Again, he was coagulated at the conclusion of the case.     Susa Day, M.D.     Geralynn Rile  D:  07/30/2014  T:  07/30/2014  Job:  [id number]

## 2014-07-30 NOTE — Interval H&P Note (Signed)
History and Physical Interval Note:  07/30/2014 9:00 AM  [name]  has presented today for surgery, with the diagnosis of left femoral neck fracture  The various methods of treatment have been discussed with the patient and family. After consideration of risks, benefits and other options for treatment, the patient has consented to  Procedure(s): ARTHROPLASTY BIPOLAR HIP (Left) as a surgical intervention .  The patient's history has been reviewed, patient examined, no change in status, stable for surgery.  I have reviewed the patient's chart and labs.  Questions were answered to the patient's satisfaction.     [name]

## 2014-07-30 NOTE — Progress Notes (Signed)
OT Cancellation Note  Patient Details Name: [name] MRN: [id number] DOB: July 16, 1932   Cancelled Treatment:    Reason Eval/Treat Not Completed: Patient not medically ready. Pt admitted to OR today for hip surgery. OT will follow up as appropriate to complete evaluation.   Villa Herb M  Cyndie Chime, OTR/L Occupational Therapist [phone] (pager)  07/30/2014, 5:12 PM

## 2014-07-30 NOTE — Discharge Instructions (Signed)
Weight bear as tolerated left leg Left knee immobilizer in place while in bed to prevent dislocation Posterior hip precautions to prevent dislocation Keep incision clean and dry, change dressing daily Follow up with Dr. Tonita Cong 10-14 days post-op for staple removal and xrays

## 2014-07-30 NOTE — Anesthesia Postprocedure Evaluation (Signed)
  Anesthesia Post-op Note  Patient: [name]  Procedure(s) Performed: Procedure(s): ARTHROPLASTY BIPOLAR HIP (Left)  Patient Location: PACU  Anesthesia Type:General  Level of Consciousness: unresponsive and confused  Airway and Oxygen Therapy: Patient Spontanous Breathing and Patient connected to nasal cannula oxygen  Post-op Pain: none  Post-op Assessment: Post-op Vital signs reviewed, Patient's Cardiovascular Status Stable, Respiratory Function Stable, Patent Airway, No signs of Nausea or vomiting and Pain level controlled  Post-op Vital Signs: stable  Last Vitals:  Filed Vitals:   07/30/14 1600  BP:   Pulse:   Temp:   Resp: 18    Complications: No apparent anesthesia complications

## 2014-07-30 NOTE — Transfer of Care (Signed)
Immediate Anesthesia Transfer of Care Note  Patient: [name]  Procedure(s) Performed: Procedure(s): ARTHROPLASTY BIPOLAR HIP (Left)  Patient Location: PACU  Anesthesia Type:General  Level of Consciousness: awake  Airway & Oxygen Therapy: Patient Spontanous Breathing and Patient connected to nasal cannula oxygen  Post-op Assessment: Report given to PACU RN, Post -op Vital signs reviewed and stable and Patient moving all extremities  Post vital signs: Reviewed and stable  Complications: No apparent anesthesia complications

## 2014-07-30 NOTE — Progress Notes (Signed)
Patient ID: [name], male   DOB: 09/17/1932, 80 y.o.   MRN: [id number]  Pt with dementia, unable to obtain hx from pt, hx obtained from medical records.  Left femoral neck fx Per medicine cleared at moderate to high risk for surgery today NPO for surgery today To OR for Left hip hemiarthroplasty with Dr. Tonita Cong this AM Step-daughter has been contacted regarding consent

## 2014-07-30 NOTE — Anesthesia Procedure Notes (Signed)
Procedure Name: Intubation Date/Time: 07/30/2014 10:18 AM Performed by: Scheryl Darter Pre-anesthesia Checklist: Patient identified, Emergency Drugs available, Suction available, Patient being monitored and Timeout performed Patient Re-evaluated:Patient Re-evaluated prior to inductionOxygen Delivery Method: Circle system utilized Preoxygenation: Pre-oxygenation with 100% oxygen Intubation Type: IV induction Ventilation: Mask ventilation without difficulty Laryngoscope Size: Miller and 2 Grade View: Grade I Tube type: Oral Tube size: 7.5 mm Number of attempts: 1 Airway Equipment and Method: Stylet Placement Confirmation: ETT inserted through vocal cords under direct vision,  positive ETCO2 and breath sounds checked- equal and bilateral Secured at: 22 cm Tube secured with: Tape Dental Injury: Teeth and Oropharynx as per pre-operative assessment

## 2014-07-30 NOTE — Progress Notes (Signed)
[facility] PROGRESS NOTE  [name] SFK:[id number] DOB: Nov 28, 1931 DOA: Aug 11, 2014 PCP: Blanchie Serve, MD  Assessment/Plan: Subcapital fracture of left hip: Plan for OR today. Pain control and physical therapy.    Vascular dementia: Resume home meds.    Code Status: full code.  Family Communication: none at bedside Disposition Plan: plan for OR Today   Consultants:  Orthopedics.  Procedures:  Left hip surgery scheduled for today  Antibiotics:  none  HPI/Subjective: No new complaints.   Objective: Filed Vitals:   07/30/14 1325  BP: 87/43  Pulse: 108  Temp: 98.5 F (36.9 C)  Resp: 18    Intake/Output Summary (Last 24 hours) at 07/30/14 1347 Last data filed at 07/30/14 1115  Gross per 24 hour  Intake   1625 ml  Output    250 ml  Net   1375 ml   Filed Weights   08/11/14 1253 2014-08-11 2042  Weight: 72.576 kg (160 lb) 69.8 kg (153 lb 14.1 oz)    Exam:   General:  Alert afebrile comfortable  Cardiovascular: s1s2  Respiratory: ctab  Abdomen: soft NT ND BS+  Musculoskeletal : painful ROM of the left leg.  Data Reviewed: Basic Metabolic Panel:  Recent Labs Lab 08-11-14 1700 08-11-14 1716  NA  --  143  K  --  4.1  CL  --  107  GLUCOSE  --  134*  BUN  --  29*  CREATININE 1.17 1.30   Liver Function Tests: No results found for this basename: AST, ALT, ALKPHOS, BILITOT, PROT, ALBUMIN,  in the last 168 hours No results found for this basename: LIPASE, AMYLASE,  in the last 168 hours No results found for this basename: AMMONIA,  in the last 168 hours CBC:  Recent Labs Lab 08/11/2014 1700 08-11-2014 1716  WBC 15.5*  --   NEUTROABS 13.1*  --   HGB 15.2 16.3  HCT 44.1 48.0  MCV 86.5  --   PLT 141*  --    Cardiac Enzymes: No results found for this basename: CKTOTAL, CKMB, CKMBINDEX, TROPONINI,  in the last 168 hours BNP (last 3 results)  Recent Labs  08/04/13 1640  PROBNP 329.1   CBG:  Recent Labs Lab 11-Aug-2014 2056  07/30/14 0454 07/30/14 1223  GLUCAP 148* 138* 124*    Recent Results (from the past 240 hour(s))  SURGICAL PCR SCREEN     Status: None   Collection Time    07/30/14 12:23 AM      Result Value Ref Range Status   MRSA, PCR NEGATIVE  NEGATIVE Final   Staphylococcus aureus NEGATIVE  NEGATIVE Final   Comment:            The Xpert SA Assay (FDA     approved for NASAL specimens     in patients over 52 years of age),     is one component of     a comprehensive surveillance     program.  Test performance has     been validated by [employer] for patients greater     than or equal to 85 year old.     It is not intended     to diagnose infection nor to     guide or monitor treatment.     Studies: Dg Elbow Complete Left  08-11-2014   CLINICAL DATA:  fall today laceration and bleeding posterior and lateral left elbow laterally, patient uncooperative  EXAM: LEFT ELBOW - COMPLETE 3+ VIEW  COMPARISON:  None.  FINDINGS: Single lateral view submitted for interpretation. This study is not a true lateral and is therefore limited as well. No obvious fracture or dislocation. No obvious joint effusion. Mild olecranon enthesopathy.  IMPRESSION: Limited single image with no obvious acute abnormalities. Given that the study consists of only a single suboptimal image however the possibility of fracture is not excluded.   Electronically Signed   By: Skipper Cliche M.D.   On: 07/29/2014 16:36   Dg Hip Complete Left  07/29/2014   CLINICAL DATA:  Fall with left hip pain.  EXAM: LEFT HIP - COMPLETE 2+ VIEW  COMPARISON:  11/03/2010  FINDINGS: There is diffuse decreased bone mineralization. There are mild symmetric degenerative changes of the hips. There is a minimally displaced subcapital left femoral neck fracture. Left pubic rami difficult to evaluate due to patient positioning. There are degenerative changes of the spine. There is moderate fecal retention over the rectum.  IMPRESSION: Minimally displaced  subcapital fracture of the left femoral neck.   Electronically Signed   By: Marin Olp M.D.   On: 07/29/2014 16:34   Dg Chest Port 1 View  07/29/2014   CLINICAL DATA:  Exam for medical clearance ; the patient is unable to provide any history; history of coronary artery disease  EXAM: PORTABLE CHEST - 1 VIEW  COMPARISON:  Portable chest x-ray of August 04, 2013  FINDINGS: The right lung is well-expanded. There is an azygos lobe anatomy. On the left there is chronic deformity of the lateral aspect of the bony thorax. There is no focal infiltrate. The heart and pulmonary vascularity within the limits of normal. The patient has undergone previous CABG.  IMPRESSION: There is no acute cardiopulmonary abnormality. There is chronic deformity of the lateral aspect of the bony thorax on the left.   Electronically Signed   By: David  Martinique   On: 07/29/2014 17:42   Dg Femur Left Port  07/30/2014   CLINICAL DATA:  Left hip fracture requiring operative repair. Initial encounter.  EXAM: PORTABLE LEFT FEMUR - 2 VIEW  COMPARISON:  Left hip radiographs - 07/29/2014  FINDINGS: The attempted cross-table lateral radiograph of the left hip is degraded due to patient positioning and overlying osseous and soft tissue structures.  Grossly unchanged subcapital left femoral neck fracture with associated foreshortening and accentuated varus angulation. No definite intra-articular extension. Expected adjacent soft tissue swelling. Scattered vascular calcifications. No radiopaque foreign body.  No additional fractures identified. Limited visualization of the knee is normal given obliquity.  IMPRESSION: Grossly unchanged appearance of minimally displaced subcapital left femoral neck fracture. No additional fractures identified.   Electronically Signed   By: Sandi Mariscal M.D.   On: 07/30/2014 09:03    Scheduled Meds: . ARIPiprazole  2 mg Oral Daily  .  ceFAZolin (ANCEF) IV  1 g Intravenous Q6H  . [START ON 07/31/2014] enoxaparin  (LOVENOX) injection  30 mg Subcutaneous Q24H  . loratadine  10 mg Oral Daily  . Memantine HCl ER  28 mg Oral QHS  . Rivastigmine  13.3 mg Transdermal QHS  . sertraline  100 mg Oral Daily   Continuous Infusions: . dextrose 5 % and 0.45 % NaCl with KCl 20 mEq/L      Active Problems:   Fall at nursing home   Mixed Alzheimer's and vascular dementia   Hip fracture requiring operative repair   Subcapital fracture of left hip    Time spent: 35 minutes    [name]  [facility] Pager [phone] If 7PM-7AM, please contact night-coverage at [email or web address], password [facility] 07/30/2014, 1:47 PM  LOS: 1 day

## 2014-07-30 NOTE — H&P (View-Only) (Signed)
Patient ID: [name], male   DOB: 11-Apr-1932, 80 y.o.   MRN: [id number]  Pt with dementia, unable to obtain hx from pt, hx obtained from medical records.  Left femoral neck fx Per medicine cleared at moderate to high risk for surgery today NPO for surgery today To OR for Left hip hemiarthroplasty with Dr. Tonita Cong this AM Step-daughter has been contacted regarding consent

## 2014-07-31 ENCOUNTER — Encounter (HOSPITAL_COMMUNITY): Payer: Self-pay | Admitting: Specialist

## 2014-07-31 DIAGNOSIS — F329 Major depressive disorder, single episode, unspecified: Secondary | ICD-10-CM

## 2014-07-31 DIAGNOSIS — F0280 Dementia in other diseases classified elsewhere without behavioral disturbance: Secondary | ICD-10-CM

## 2014-07-31 DIAGNOSIS — S72012A Unspecified intracapsular fracture of left femur, initial encounter for closed fracture: Secondary | ICD-10-CM | POA: Diagnosis not present

## 2014-07-31 LAB — CBC
HCT: 32 % — ABNORMAL LOW (ref 39.0–52.0)
HEMOGLOBIN: 10.5 g/dL — AB (ref 13.0–17.0)
MCH: 28.8 pg (ref 26.0–34.0)
MCHC: 32.8 g/dL (ref 30.0–36.0)
MCV: 87.9 fL (ref 78.0–100.0)
Platelets: 88 10*3/uL — ABNORMAL LOW (ref 150–400)
RBC: 3.64 MIL/uL — AB (ref 4.22–5.81)
RDW: 14 % (ref 11.5–15.5)
WBC: 10 10*3/uL (ref 4.0–10.5)

## 2014-07-31 LAB — BASIC METABOLIC PANEL
Anion gap: 11 (ref 5–15)
BUN: 25 mg/dL — ABNORMAL HIGH (ref 6–23)
CALCIUM: 7.7 mg/dL — AB (ref 8.4–10.5)
CHLORIDE: 104 meq/L (ref 96–112)
CO2: 22 meq/L (ref 19–32)
Creatinine, Ser: 1.15 mg/dL (ref 0.50–1.35)
GFR calc Af Amer: 67 mL/min — ABNORMAL LOW (ref >90)
GFR calc non Af Amer: 58 mL/min — ABNORMAL LOW (ref >90)
GLUCOSE: 182 mg/dL — AB (ref 70–99)
Potassium: 4.2 mEq/L (ref 3.7–5.3)
SODIUM: 137 meq/L (ref 137–147)

## 2014-07-31 LAB — HEMOGLOBIN A1C
Hgb A1c MFr Bld: 5.8 % — ABNORMAL HIGH (ref <5.7)
Mean Plasma Glucose: 120 mg/dL — ABNORMAL HIGH (ref <117)

## 2014-07-31 LAB — GLUCOSE, CAPILLARY
GLUCOSE-CAPILLARY: 133 mg/dL — AB (ref 70–99)
GLUCOSE-CAPILLARY: 142 mg/dL — AB (ref 70–99)
GLUCOSE-CAPILLARY: 160 mg/dL — AB (ref 70–99)
Glucose-Capillary: 163 mg/dL — ABNORMAL HIGH (ref 70–99)

## 2014-07-31 MED ORDER — ENSURE COMPLETE PO LIQD
237.0000 mL | Freq: Two times a day (BID) | ORAL | Status: DC
  Administered 2014-07-31 – 2014-08-02 (×3): 237 mL via ORAL

## 2014-07-31 NOTE — Progress Notes (Signed)
Subjective: 1 Day Post-Op Procedure(s) (LRB): ARTHROPLASTY BIPOLAR HIP (Left) Patient resting comfortably this AM. No complaints.   Objective: Vital signs in last 24 hours: Temp:  [98.1 F (36.7 C)-100.6 F (38.1 C)] 98.4 F (36.9 C) (10/05 0358) Pulse Rate:  [49-115] 101 (10/05 0358) Resp:  [16-20] 16 (10/05 0400) BP: (87-110)/(43-85) 110/63 mmHg (10/05 0358) SpO2:  [92 %-100 %] 97 % (10/05 0400)  Intake/Output from previous day: 10/04 0701 - 10/05 0700 In: 2688.8 [P.O.:800; I.V.:1638.8; IV Piggyback:250] Out: 244 [Urine:525; Blood:250] Intake/Output this shift:     Recent Labs  07/29/14 1700 07/29/14 1716 07/30/14 1500 07/31/14 0557  HGB 15.2 16.3 12.1* 10.5*    Recent Labs  07/30/14 1500 07/31/14 0557  WBC 13.3* 10.0  RBC 4.07* 3.64*  HCT 35.6* 32.0*  PLT 105* 88*    Recent Labs  07/29/14 1716 07/31/14 0557  NA 143 137  K 4.1 4.2  CL 107 104  CO2  --  22  BUN 29* 25*  CREATININE 1.30 1.15  GLUCOSE 134* 182*  CALCIUM  --  7.7*   No results found for this basename: LABPT, INR,  in the last 72 hours  Neurologically intact ABD soft Neurovascular intact Sensation intact distally Intact pulses distally Dorsiflexion/Plantar flexion intact Incision: dressing C/D/I and no drainage No cellulitis present Compartment soft no calf pain or sign of DVT Knee immobilizer in place  Assessment/Plan: 1 Day Post-Op Procedure(s) (LRB): ARTHROPLASTY BIPOLAR HIP (Left) Advance diet Up with therapy D/C IV fluids Knee immobilizer L leg while in bed to prevent dislocation Posterior hip precautions WBAT with PT Return to SNF when medically stable  [name] M. 07/31/2014, 7:21 AM

## 2014-07-31 NOTE — Progress Notes (Signed)
PT Cancellation Note  Patient Details Name: [name] MRN: [id number] DOB: 01/08/1932   Cancelled Treatment:    Reason Eval/Treat Not Completed: Fatigue/lethargy limiting ability to participate. Attempted to perform PT evaluation per orders, but pt not alert enough to participate. Pt maintaining eyes closed despite verbal and tactile stimulus. At this time unable to perform appropriate evaluation. Some gross movement noted in RLE and some grimacing with ROM to LLE. Discussed with RN who reports pt has been like this throughout the day. Will continue to attempt for evaluation if able and appropriate.  Canary Brim [facility] 07/31/2014, 3:10 PM

## 2014-07-31 NOTE — Progress Notes (Addendum)
[facility] PROGRESS NOTE  [name] QAS:[id number] DOB: 1932/08/28 DOA: 07/29/2014 PCP: Blanchie Serve, MD  Assessment/Plan: Subcapital fracture of left hip: S/P Repair. Pain control and physical therapy.    Vascular dementia: Resume home meds.   Anemia and thrombocytopenia: Hemoglobin dropped to 10, possibly secondary to anemia of blood loss.  Thrombocytopenia: on lovenox. If it drops further will d./c lovenox.    Increased lethargy probably secondary to narcotic pain meds.    Code Status: full code.  Family Communication: none at bedside Disposition Plan: plan for SNF.    Consultants:  Orthopedics.  Procedures: Left hip surgery  Antibiotics:  none  HPI/Subjective: SLEEPING.   Objective: Filed Vitals:   07/31/14 1430  BP: 102/50  Pulse: 79  Temp: 99.5 F (37.5 C)  Resp: 20    Intake/Output Summary (Last 24 hours) at 07/31/14 1712 Last data filed at 07/31/14 0800  Gross per 24 hour  Intake 1718.75 ml  Output     75 ml  Net 1643.75 ml   Filed Weights   07/29/14 1253 07/29/14 2042  Weight: 72.576 kg (160 lb) 69.8 kg (153 lb 14.1 oz)    Exam:   General:  Alert afebrile comfortable  Cardiovascular: s1s2  Respiratory: ctab  Abdomen: soft NT ND BS+  Musculoskeletal : painful ROM of the left leg.  Data Reviewed: Basic Metabolic Panel:  Recent Labs Lab 07/29/14 1700 07/29/14 1716 07/31/14 0557  NA  --  143 137  K  --  4.1 4.2  CL  --  107 104  CO2  --   --  22  GLUCOSE  --  134* 182*  BUN  --  29* 25*  CREATININE 1.17 1.30 1.15  CALCIUM  --   --  7.7*   Liver Function Tests: No results found for this basename: AST, ALT, ALKPHOS, BILITOT, PROT, ALBUMIN,  in the last 168 hours No results found for this basename: LIPASE, AMYLASE,  in the last 168 hours No results found for this basename: AMMONIA,  in the last 168 hours CBC:  Recent Labs Lab 07/29/14 1700 07/29/14 1716 07/30/14 1500 07/31/14 0557  WBC 15.5*  --   13.3* 10.0  NEUTROABS 13.1*  --   --   --   HGB 15.2 16.3 12.1* 10.5*  HCT 44.1 48.0 35.6* 32.0*  MCV 86.5  --  87.5 87.9  PLT 141*  --  105* 88*   Cardiac Enzymes: No results found for this basename: CKTOTAL, CKMB, CKMBINDEX, TROPONINI,  in the last 168 hours BNP (last 3 results)  Recent Labs  08/04/13 1640  PROBNP 329.1   CBG:  Recent Labs Lab 07/30/14 1711 07/30/14 2210 07/31/14 0653 07/31/14 1108 07/31/14 1630  GLUCAP 135* 172* 163* 133* 142*    Recent Results (from the past 240 hour(s))  SURGICAL PCR SCREEN     Status: None   Collection Time    07/30/14 12:23 AM      Result Value Ref Range Status   MRSA, PCR NEGATIVE  NEGATIVE Final   Staphylococcus aureus NEGATIVE  NEGATIVE Final   Comment:            The Xpert SA Assay (FDA     approved for NASAL specimens     in patients over 45 years of age),     is one component of     a comprehensive surveillance     program.  Test performance has     been validated by [employer]  [facility] for patients greater     than or equal to 76 year old.     It is not intended     to diagnose infection nor to     guide or monitor treatment.     Studies: Dg Hip Complete Left  07/30/2014   CLINICAL DATA:  Postoperative left hip pain after left hip arthroplasty.  EXAM: LEFT HIP - COMPLETE 2+ VIEW  COMPARISON:  07/29/2014  FINDINGS: Skin staples are present over the lateral soft tissues. There is evidence of patient's left hip arthroplasty normally located and intact. Remainder of the exam is unchanged.  IMPRESSION: Postop change compatible recent left hip arthroplasty. No acute findings.   Electronically Signed   By: Marin Olp M.D.   On: 07/30/2014 14:08   Dg Femur Left Port  07/30/2014   CLINICAL DATA:  Left hip fracture requiring operative repair. Initial encounter.  EXAM: PORTABLE LEFT FEMUR - 2 VIEW  COMPARISON:  Left hip radiographs - 07/29/2014  FINDINGS: The attempted cross-table lateral radiograph of the left hip is degraded  due to patient positioning and overlying osseous and soft tissue structures.  Grossly unchanged subcapital left femoral neck fracture with associated foreshortening and accentuated varus angulation. No definite intra-articular extension. Expected adjacent soft tissue swelling. Scattered vascular calcifications. No radiopaque foreign body.  No additional fractures identified. Limited visualization of the knee is normal given obliquity.  IMPRESSION: Grossly unchanged appearance of minimally displaced subcapital left femoral neck fracture. No additional fractures identified.   Electronically Signed   By: Sandi Mariscal M.D.   On: 07/30/2014 09:03    Scheduled Meds: . ARIPiprazole  2 mg Oral Daily  . enoxaparin (LOVENOX) injection  30 mg Subcutaneous Q24H  . feeding supplement (ENSURE COMPLETE)  237 mL Oral BID BM  . insulin aspart  0-9 Units Subcutaneous TID WC  . loratadine  10 mg Oral Daily  . Memantine HCl ER  28 mg Oral QHS  . Rivastigmine  13.3 mg Transdermal QHS  . sertraline  100 mg Oral Daily   Continuous Infusions:    Active Problems:   Fall at nursing home   Mixed Alzheimer's and vascular dementia   Hip fracture requiring operative repair   Subcapital fracture of left hip    Time spent: 35 minutes    Caryl Manas  [facility] Pager [phone] If 7PM-7AM, please contact night-coverage at [email or web address], password [facility] 07/31/2014, 5:12 PM  LOS: 2 days

## 2014-07-31 NOTE — Progress Notes (Signed)
INITIAL NUTRITION ASSESSMENT  DOCUMENTATION CODES Per approved criteria  -Severe malnutrition in the context of chronic illness  Pt meets criteria for severe MALNUTRITION in the context of chronic illness as evidenced by moderate to severe fat and muscle wasting.  INTERVENTION: - Ensure Complete po BID, each supplement provides 350 kcal and 13 grams of protein - RD will continue to monitor for nutrition care plan.  NUTRITION DIAGNOSIS: Malnutrition related to Alzheimer's Dementia as evidenced by moderate to severe fat and muscle wasting  Goal: Pt to meet >/= 90% of their estimated nutrition needs   Monitor:  Weight trend, po intake, acceptance of supplements  Reason for Assessment: MST  80 y.o. male  Admitting Dx: <principal problem not specified>  ASSESSMENT: With history of mixed Alzheimer's and vascular dementia, arthritis, h/o depression, insomnia who presented to the ED after a witnessed fall at nursing home.  - Pt was sleeping during RD visit. - Per RN, pt has good po intake when fed with full supervision.  - PO intake recorded as 90%. - Pt's weight stable per chart history.  Nutrition Focused Physical Exam:  Subcutaneous Fat:  Orbital Region: severe wasting Upper Arm Region: moderate to severe wasting Thoracic and Lumbar Region: n/a  Muscle:  Temple Region: severe wasting Clavicle Bone Region: moderate to severe wasting Clavicle and Acromion Bone Region: moderate to severe wasting Scapular Bone Region: n/a Dorsal Hand: moderate wasting Patellar Region: moderate to severe wasting Anterior Thigh Region: moderate wasting Posterior Calf Region: moderate wasting  Edema: none   Height: Ht Readings from Last 1 Encounters:  07/29/14 6' (1.829 m)    Weight: Wt Readings from Last 1 Encounters:  07/29/14 153 lb 14.1 oz (69.8 kg)    Ideal Body Weight: 77.6 kg  % Ideal Body Weight: 90%  Wt Readings from Last 10 Encounters:  07/29/14 153 lb 14.1 oz (69.8  kg)  07/29/14 153 lb 14.1 oz (69.8 kg)  05/17/14 150 lb 12.8 oz (68.402 kg)  04/18/14 149 lb 3.2 oz (67.677 kg)  03/15/14 148 lb (67.132 kg)  02/15/14 146 lb (66.225 kg)  01/10/14 141 lb 9.6 oz (64.229 kg)  12/16/13 124 lb 4.8 oz (56.382 kg)  11/14/13 123 lb 4.8 oz (55.929 kg)  08/04/13 124 lb 9 oz (56.5 kg)    Usual Body Weight: 149-150 lbs  % Usual Body Weight: 102%  BMI:  Body mass index is 20.87 kg/(m^2).  Estimated Nutritional Needs: Kcal: 1850-2000 Protein: 100-120 g Fluid: 1.9-2.0 L/day  Skin: closed incision on left leg, laceration on left elbow, abrasion on right eye,   Diet Order: Carb Control  EDUCATION NEEDS: -Education needs addressed   Intake/Output Summary (Last 24 hours) at 07/31/14 1433 Last data filed at 07/31/14 0800  Gross per 24 hour  Intake 1958.75 ml  Output     75 ml  Net 1883.75 ml    Last BM: 10/3   Labs:   Recent Labs Lab 07/29/14 1700 07/29/14 1716 07/31/14 0557  NA  --  143 137  K  --  4.1 4.2  CL  --  107 104  CO2  --   --  22  BUN  --  29* 25*  CREATININE 1.17 1.30 1.15  CALCIUM  --   --  7.7*  GLUCOSE  --  134* 182*    CBG (last 3)   Recent Labs  07/30/14 2210 07/31/14 0653 07/31/14 1108  GLUCAP 172* 163* 133*    Scheduled Meds: . ARIPiprazole  2 mg  Oral Daily  . enoxaparin (LOVENOX) injection  30 mg Subcutaneous Q24H  . insulin aspart  0-9 Units Subcutaneous TID WC  . loratadine  10 mg Oral Daily  . Memantine HCl ER  28 mg Oral QHS  . Rivastigmine  13.3 mg Transdermal QHS  . sertraline  100 mg Oral Daily    Continuous Infusions:   Past Medical History  Diagnosis Date  . Anxiety state, unspecified   . Arthritis     fingers/shoulders  . Unspecified cataract   . Colon polyps     last colonoscopy 2010  . CAD (coronary artery disease) 2006    s/p CABG  . Senile dementia, uncomplicated   . Depression   . HLD (hyperlipidemia)   . Gastric ulcer, unspecified as acute or chronic, without mention of  hemorrhage, perforation, or obstruction   . Hypertension   . Insomnia   . TIA (transient ischemic attack)   . Clostridium difficile infection 2012    recurrent-last treatment 10/2010  . BPH (benign prostatic hypertrophy)   . H/O: hypothyroidism   . History of chronic pancreatitis   . Alcohol abuse, in remission     sober x years  . Paroxysmal a-fib     history of  . Hx: UTI (urinary tract infection)   . Diabetes mellitus type II     history of  . ARF (acute renal failure)     history of  . History of chicken pox   . Pulmonary nodule/lesion, solitary 05/2010    31mm L lung base nodule, if high risk rec f/u with CT 1 yr, if low risk, no f/u needed  . Muscle weakness (generalized)   . Lack of coordination   . Psychosis   . Episodic mood disorder   . Extrapyramidal disorder   . Altered mental status   . ALCOHOL ABUSE, HX OF 12/24/2006    Qualifier: Diagnosis of  By: [name]  MD, [name]    . COLON POLYP 12/24/2006    Qualifier: Diagnosis of  By: [name]    . Oral lesion 02/27/2011  . Subdural hematoma, post-traumatic 08/04/2013  . Protein-calorie malnutrition, severe 08/05/2013    Past Surgical History  Procedure Laterality Date  . Coronary artery bypass graft  2006  . Injection knee  09/06/2001  . Cardiovascular stress test  05/2008    small regions of perfusion abnormality suggestive of diaphragm artifact, exercise cap 7 METs  . Hip arthroplasty Left 07/30/2014    Procedure: ARTHROPLASTY BIPOLAR HIP;  Surgeon: Johnn Hai, MD;  Location: [place];  Service: Orthopedics;  Laterality: Left;    Terrace Arabia RD, LDN

## 2014-07-31 NOTE — Care Management Note (Signed)
CARE MANAGEMENT NOTE 07/31/2014  Patient:  [name], [name]   Account Number:  [id number]  Date Initiated:  07/31/2014  Documentation initiated by:  Ricki Miller  Subjective/Objective Assessment:   80 yr old male admitted after a fall with a left hip fracture. Patient under went a left femur arthroplasty.     Action/Plan:   Patient from [facility], will return there when medically ready. Social worker is aware.   Anticipated DC Date:  08/01/2014   Anticipated DC Plan:  SKILLED NURSING FACILITY  In-house referral  Clinical Social Worker      DC Planning Services  CM consult      [facility] Choice  NA   Choice offered to / List presented to:     DME arranged  NA        [place] arranged  NA      Status of service:  Completed, signed off Medicare Important Message given?  YES (If response is "NO", the following Medicare IM given date fields will be blank) Date Medicare IM given:  07/31/2014 Medicare IM given by:  Ricki Miller Date Additional Medicare IM given:   Additional Medicare IM given by:    Discharge Disposition:  [place]  Per UR Regulation:  Reviewed for med. necessity/level of care/duration of stay

## 2014-08-01 LAB — BASIC METABOLIC PANEL
Anion gap: 12 (ref 5–15)
BUN: 22 mg/dL (ref 6–23)
CHLORIDE: 106 meq/L (ref 96–112)
CO2: 22 meq/L (ref 19–32)
Calcium: 7.9 mg/dL — ABNORMAL LOW (ref 8.4–10.5)
Creatinine, Ser: 1.14 mg/dL (ref 0.50–1.35)
GFR calc Af Amer: 68 mL/min — ABNORMAL LOW (ref >90)
GFR calc non Af Amer: 59 mL/min — ABNORMAL LOW (ref >90)
GLUCOSE: 147 mg/dL — AB (ref 70–99)
POTASSIUM: 4.2 meq/L (ref 3.7–5.3)
SODIUM: 140 meq/L (ref 137–147)

## 2014-08-01 LAB — GLUCOSE, CAPILLARY
GLUCOSE-CAPILLARY: 150 mg/dL — AB (ref 70–99)
GLUCOSE-CAPILLARY: 189 mg/dL — AB (ref 70–99)
Glucose-Capillary: 229 mg/dL — ABNORMAL HIGH (ref 70–99)
Glucose-Capillary: 160 mg/dL — ABNORMAL HIGH (ref 70–99)

## 2014-08-01 LAB — RETICULOCYTES
RBC.: 3.75 MIL/uL — AB (ref 4.22–5.81)
RETIC COUNT ABSOLUTE: 33.8 10*3/uL (ref 19.0–186.0)
Retic Ct Pct: 0.9 % (ref 0.4–3.1)

## 2014-08-01 LAB — VITAMIN B12: VITAMIN B 12: 468 pg/mL (ref 211–911)

## 2014-08-01 LAB — CBC
HCT: 29.8 % — ABNORMAL LOW (ref 39.0–52.0)
HEMOGLOBIN: 9.9 g/dL — AB (ref 13.0–17.0)
MCH: 29.2 pg (ref 26.0–34.0)
MCHC: 33.2 g/dL (ref 30.0–36.0)
MCV: 87.9 fL (ref 78.0–100.0)
PLATELETS: 92 10*3/uL — AB (ref 150–400)
RBC: 3.39 MIL/uL — AB (ref 4.22–5.81)
RDW: 14 % (ref 11.5–15.5)
WBC: 9.1 10*3/uL (ref 4.0–10.5)

## 2014-08-01 LAB — IRON AND TIBC
IRON: 12 ug/dL — AB (ref 42–135)
Saturation Ratios: 6 % — ABNORMAL LOW (ref 20–55)
TIBC: 186 ug/dL — ABNORMAL LOW (ref 215–435)
UIBC: 174 ug/dL (ref 125–400)

## 2014-08-01 LAB — FOLATE: FOLATE: 19.2 ng/mL

## 2014-08-01 LAB — FERRITIN: Ferritin: 462 ng/mL — ABNORMAL HIGH (ref 22–322)

## 2014-08-01 NOTE — Progress Notes (Signed)
[facility] PROGRESS NOTE  WLLIAM Madden WPY:[id number] DOB: May 13, 1932 DOA: 07/29/2014 PCP: Blanchie Serve, MD Interim summary: [name] is a 80 y.o. male  With history of mixed Alzheimer's and vascular dementia, arthritis, h/o depression, insomnia who presented to the ED after a witnessed fall at nursing home. After the fall patient developed subsequent left hip discomfort and presented to the ED for further evaluation. X-rays of left hip would show subcapital fracture the left femoral neck. He underwent Left hip hemiarthroplasty  On 10/4 . He is currently awaiting PT eval. He has been lethargic possibly from the narcotic pain meds which have been discontinued.   Assessment/Plan: Subcapital fracture of left hip: S/P Left hip hemiarthroplasty on 10/4 . Pain control and physical therapy.    Vascular dementia: Resume home meds.   Anemia and thrombocytopenia: Hemoglobin dropped to 10, possibly secondary to anemia of blood loss. Anemia panel and stool for occult blood ordered.  Thrombocytopenia: on lovenox. If it drops further will d./c lovenox.    Increased lethargy probably secondary to narcotic pain meds. Discontinued norco.    Code Status: full code.  Family Communication: none at bedside Disposition Plan: plan for SNF.    Consultants:  Orthopedics.  Procedures: Left hip hemiarthroplasty  On 10/4  Antibiotics:  One dose of cefazolin.  HPI/Subjective: SLEEPING.   Objective: Filed Vitals:   08/01/14 1357  BP: 102/62  Pulse: 66  Temp: 97.3 F (36.3 C)  Resp: 17    Intake/Output Summary (Last 24 hours) at 08/01/14 1502 Last data filed at 08/01/14 0805  Gross per 24 hour  Intake    360 ml  Output    750 ml  Net   -390 ml   Filed Weights   07/29/14 1253 07/29/14 2042  Weight: 72.576 kg (160 lb) 69.8 kg (153 lb 14.1 oz)    Exam:   General:   Sleeping afebrile comfortable  Cardiovascular: s1s2  Respiratory: ctab  Abdomen: soft NT ND  BS+  Musculoskeletal : painful ROM of the left leg.  Data Reviewed: Basic Metabolic Panel:  Recent Labs Lab 07/29/14 1700 07/29/14 1716 07/31/14 0557 08/01/14 0543  NA  --  143 137 140  K  --  4.1 4.2 4.2  CL  --  107 104 106  CO2  --   --  22 22  GLUCOSE  --  134* 182* 147*  BUN  --  29* 25* 22  CREATININE 1.17 1.30 1.15 1.14  CALCIUM  --   --  7.7* 7.9*   Liver Function Tests: No results found for this basename: AST, ALT, ALKPHOS, BILITOT, PROT, ALBUMIN,  in the last 168 hours No results found for this basename: LIPASE, AMYLASE,  in the last 168 hours No results found for this basename: AMMONIA,  in the last 168 hours CBC:  Recent Labs Lab 07/29/14 1700 07/29/14 1716 07/30/14 1500 07/31/14 0557 08/01/14 0543  WBC 15.5*  --  13.3* 10.0 9.1  NEUTROABS 13.1*  --   --   --   --   HGB 15.2 16.3 12.1* 10.5* 9.9*  HCT 44.1 48.0 35.6* 32.0* 29.8*  MCV 86.5  --  87.5 87.9 87.9  PLT 141*  --  105* 88* 92*   Cardiac Enzymes: No results found for this basename: CKTOTAL, CKMB, CKMBINDEX, TROPONINI,  in the last 168 hours BNP (last 3 results)  Recent Labs  08/04/13 1640  PROBNP 329.1   CBG:  Recent Labs Lab 07/31/14 1108 07/31/14 1630 07/31/14  2108 08/01/14 0653 08/01/14 1217  GLUCAP 133* 142* 160* 150* 229*    Recent Results (from the past 240 hour(s))  SURGICAL PCR SCREEN     Status: None   Collection Time    07/30/14 12:23 AM      Result Value Ref Range Status   MRSA, PCR NEGATIVE  NEGATIVE Final   Staphylococcus aureus NEGATIVE  NEGATIVE Final   Comment:            The Xpert SA Assay (FDA     approved for NASAL specimens     in patients over 37 years of age),     is one component of     a comprehensive surveillance     program.  Test performance has     been validated by [name] for patients greater     than or equal to 60 year old.     It is not intended     to diagnose infection nor to     guide or monitor treatment.      Studies: No results found.  Scheduled Meds: . ARIPiprazole  2 mg Oral Daily  . enoxaparin (LOVENOX) injection  30 mg Subcutaneous Q24H  . feeding supplement (ENSURE COMPLETE)  237 mL Oral BID BM  . insulin aspart  0-9 Units Subcutaneous TID WC  . loratadine  10 mg Oral Daily  . Memantine HCl ER  28 mg Oral QHS  . Rivastigmine  13.3 mg Transdermal QHS  . sertraline  100 mg Oral Daily   Continuous Infusions:    Active Problems:   Fall at nursing home   Mixed Alzheimer's and vascular dementia   Hip fracture requiring operative repair   Subcapital fracture of left hip    Time spent: 35 minutes    [name]  [facility] Pager [phone] If 7PM-7AM, please contact night-coverage at [email or web address], password [facility] 08/01/2014, 3:02 PM  LOS: 3 days

## 2014-08-01 NOTE — Clinical Social Work Psychosocial (Signed)
Clinical Social Work Department BRIEF PSYCHOSOCIAL ASSESSMENT 08/01/2014  Patient:  [name], [name]     Account Number:  [id number]     Admit date:  07/29/2014  Clinical Social Worker:  Wylene Men  Date/Time:  08/01/2014 01:40 PM  Referred by:  Physician  Date Referred:  08/01/2014 Referred for  SNF Placement  Psychosocial assessment   Other Referral:   none   Interview type:  Other - See comment Other interview type:   pt alert - oriented intermittently to self.  Pt caregiver is Art gallery manager    PSYCHOSOCIAL DATA Living Status:  FACILITY Admitted from facility:  [facility] Level of care:  [place] Primary support name:  [name] Primary support relationship to patient:  NONE Degree of support available:   adequate    CURRENT CONCERNS Current Concerns  Post-Acute Placement   Other Concerns:   none    SOCIAL WORK ASSESSMENT / PLAN Pt is from [facility], SNF (longterm care).  Pt is currently alert and oriented intermittently to self only. CSW intern spoke with pt granddaughter, [name] who informed CSW intern of caregiver being [name] (daughter-in-law).  CSW Intern contacted [name] [phone]) regarding posisble return to [facility] once pt is medically stable.  Per [name], pt will return to longterm care at [facility].   Assessment/plan status:  Psychosocial Support/Ongoing Assessment of Needs Other assessment/ plan:   FL2  PASARR-should be exisiting   Information/referral to community resources:   SNF- longterm care    PATIENT'S/FAMILY'S RESPONSE TO PLAN OF CARE: Pt cargiver, [name] agreeable for pt to return to [facility], SNF once medically stable.       Nonnie Done, [place] [phone]  Psychiatric & Orthopedics (5N 1-16) Clinical Social Worker

## 2014-08-01 NOTE — Progress Notes (Signed)
PT Cancellation Note  Patient Details Name: [name] MRN: [id number] DOB: [birth year]   Cancelled Treatment:    Reason Eval/Treat Not Completed: Fatigue/lethargy limiting ability to participate;PT screened, no needs identified, will sign off.  Discussed with CSW that plan is to return back to SNF from which pt received. Attempted twice to see pt for PT eval, but pt minimally responsive and unable to follow commands in order to complete formal evaluation. Pt currently using KI in bed per order for positioning but has not been alert enough to participate in mobility. At this time, pt not appropriate for PT interventions. Nursing staff to provide appropriate positioning and ROM as needed. PT to sign off.   Canary Brim [facility] 08/01/2014, 2:20 PM

## 2014-08-01 NOTE — Progress Notes (Signed)
Subjective: 2 Days Post-Op Procedure(s) (LRB): ARTHROPLASTY BIPOLAR HIP (Left) Patient resting. No C/o  Objective: Vital signs in last 24 hours: Temp:  [97.5 F (36.4 C)-99.5 F (37.5 C)] 97.5 F (36.4 C) (10/06 0865) Pulse Rate:  [67-79] 67 (10/06 0632) Resp:  [14-20] 20 (10/06 0400) BP: (93-116)/(50-57) 93/57 mmHg (10/06 0632) SpO2:  [94 %-100 %] 100 % (10/06 7846)  Intake/Output from previous day: 10/05 0701 - 10/06 0700 In: 480 [P.O.:480] Out: 750 [Urine:750] Intake/Output this shift:     Recent Labs  07/29/14 1700 07/29/14 1716 07/30/14 1500 07/31/14 0557 08/01/14 0543  HGB 15.2 16.3 12.1* 10.5* 9.9*    Recent Labs  07/31/14 0557 08/01/14 0543  WBC 10.0 9.1  RBC 3.64* 3.39*  HCT 32.0* 29.8*  PLT 88* 92*    Recent Labs  07/31/14 0557 08/01/14 0543  NA 137 140  K 4.2 4.2  CL 104 106  CO2 22 22  BUN 25* 22  CREATININE 1.15 1.14  GLUCOSE 182* 147*  CALCIUM 7.7* 7.9*   No results found for this basename: LABPT, INR,  in the last 72 hours  Neurologically intact ABD soft Neurovascular intact Sensation intact distally Intact pulses distally Dorsiflexion/Plantar flexion intact Incision: dressing C/D/I and no drainage No cellulitis present Compartment soft no calf pain or sign of DVT  Assessment/Plan: 2 Days Post-Op Procedure(s) (LRB): ARTHROPLASTY BIPOLAR HIP (Left) Advance diet Up with therapy D/C IV fluids WBAT LLE with PT Posterior hip precautions, knee immobilizer when in bed to prevent dislocation Platelets trending up today recommend continued lovenox at same dose Eventual D/C back to SNF when medically stable Dressing change today Will discuss with Dr. Mliss Fritz, [place]. 08/01/2014, 7:31 AM

## 2014-08-01 NOTE — Clinical Social Work Note (Signed)
CSW intern spoke with Enid Derry (pt main caregiver) who states pt will return to [facility] once medically cleared.  CSW contacted [place] at [facility], SNF to confirm this disposition.  [place] on board for pt return once medically stable.  Nonnie Done, [place] [phone]  Psychiatric & Orthopedics (5N 1-16) Clinical Social Worker

## 2014-08-01 NOTE — Progress Notes (Signed)
OT Cancellation Note  Patient Details Name: [name] MRN: [id number] DOB: [birth year]   Cancelled Treatment:      Reason Eval/Treat Not Completed: Fatigue/lethargy limiting ability to participate. Attempted to perform OT evaluation per orders, but pt not alert enough to participate. Pt kept eyes closed despite verbal and tactile stimulus (sternal and bilateral UE rub and wet wash cloth to face). Pt is unable to participate in evaluation at this time. Will continue to attempt for evaluation if able and appropriate.   Percell Miller Beth Dixon 08/01/2014, 8:03 AM

## 2014-08-02 DIAGNOSIS — F0150 Vascular dementia without behavioral disturbance: Secondary | ICD-10-CM

## 2014-08-02 DIAGNOSIS — N400 Enlarged prostate without lower urinary tract symptoms: Secondary | ICD-10-CM

## 2014-08-02 DIAGNOSIS — G309 Alzheimer's disease, unspecified: Secondary | ICD-10-CM

## 2014-08-02 DIAGNOSIS — S72002D Fracture of unspecified part of neck of left femur, subsequent encounter for closed fracture with routine healing: Secondary | ICD-10-CM

## 2014-08-02 LAB — GLUCOSE, CAPILLARY
Glucose-Capillary: 143 mg/dL — ABNORMAL HIGH (ref 70–99)
Glucose-Capillary: 108 mg/dL — ABNORMAL HIGH (ref 70–99)
Glucose-Capillary: 206 mg/dL — ABNORMAL HIGH (ref 70–99)

## 2014-08-02 LAB — CLOSTRIDIUM DIFFICILE BY PCR: Toxigenic C. Difficile by PCR: NEGATIVE

## 2014-08-02 LAB — OCCULT BLOOD X 1 CARD TO LAB, STOOL: Fecal Occult Bld: NEGATIVE

## 2014-08-02 MED ORDER — RESOURCE THICKENUP CLEAR PO POWD
ORAL | Status: DC | PRN
  Filled 2014-08-02: qty 125

## 2014-08-02 MED ORDER — HYDROCODONE-ACETAMINOPHEN 5-325 MG PO TABS: 1.0000 | ORAL_TABLET | ORAL | Status: DC | PRN

## 2014-08-02 NOTE — Discharge Summary (Addendum)
Physician Discharge Summary  Patient ID: [name] MRN: [id number] DOB/AGE: [birth year] 80 y.o.  Admit date: 07/29/2014 Discharge date: 08/02/2014  Primary Care Physician:  Blanchie Serve, MD  Discharge Diagnoses:    . Hip fracture requiring operative repair . Subcapital fracture of left hip . Fall at nursing home . Mixed Alzheimer's and vascular dementia  Consults: Orthopedics   Recommendations for Outpatient Follow-up:  Discharge diet: Carb modified dysphagia 3 diet with nectar thick liquids  Orthopedic instructions WBAT LLE with PT  Posterior hip precautions, knee immobilizer when in bed to prevent dislocation  Platelets trending up today recommend continued lovenox at same dose x 10days  Please use Norco sparingly only for severe pain  Allergies:   Allergies  Allergen Reactions  . Codeine     REACTION: Rash,itching     Discharge Medications:   Medication List    STOP taking these medications       sennosides-docusate sodium 8.6-50 MG tablet  Commonly known as:  SENOKOT-S      TAKE these medications       ARIPiprazole 2 MG tablet  Commonly known as:  ABILIFY  Take 2 mg by mouth daily.     CERTAGEN PO  Take 1 tablet by mouth daily.     cholecalciferol 1000 UNITS tablet  Commonly known as:  VITAMIN D  Take 1,000 Units by mouth daily.     docusate sodium 100 MG capsule  Commonly known as:  COLACE  Take 1 capsule (100 mg total) by mouth 2 (two) times daily as needed for mild constipation.     enoxaparin 30 MG/0.3ML injection  Commonly known as:  LOVENOX  Inject 0.3 mLs (30 mg total) into the skin daily.     EXELON 13.3 MG/24HR Pt24  Generic drug:  Rivastigmine  Place 13.3 mg onto the skin daily.     HYDROcodone-acetaminophen 5-325 MG per tablet  Commonly known as:  NORCO/VICODIN  Take 1 tablet by mouth every 4 (four) hours as needed.     loratadine 10 MG tablet  Commonly known as:  CLARITIN  Take 10 mg by mouth daily.     Melatonin 3  MG Tabs  Take 3 mg by mouth at bedtime.     NAMENDA XR 28 MG Cp24  Generic drug:  Memantine HCl ER  Take 28 mg by mouth daily.     sertraline 100 MG tablet  Commonly known as:  ZOLOFT  Take 100 mg by mouth daily.         Brief H and P: For complete details please refer to admission H and P, but in brief [name] is a 80 y.o. male  With history of mixed Alzheimer's and vascular dementia, arthritis, h/o depression, insomnia who presented to the ED after a witnessed fall at nursing home. After the fall patient developed subsequent left hip discomfort and presented to the ED for further evaluation. X-rays of left hip would show subcapital fracture the left femoral neck. History was obtained from EMR as patient has advanced dementia and is unable to provide significant history.      Hospital Course:  [name] is a 80 y.o. male with history of dementia, arthritis presented with a witnessed fall at the nursing home. After the fall patient developed subsequent left hip discomfort and presented to the ED for further evaluation. X-rays of left hip would show subcapital fracture the left femoral neck. He underwent Left hip hemiarthroplasty On 10/4 . Postoperatively, he had  been lethargic possibly from the narcotic pain meds which have been discontinued.    Subcapital fracture of left hip:  S/P Left hip hemiarthroplasty on 10/4  - Patient is currently at baseline, physical therapy recommended continued rehabilitation at the skilled nursing facility  Dementia: Continue home medications  Anemia and thrombocytopenia Hemoglobin dropped to 10, possibly secondary to anemia of blood loss. Hemoglobin has been stable, please check CBC, BMET at followup appointment.  Stool occult test on 10/70 negative, C. difficile negative Platelets are improving, per orthopedics continue Lovenox for 10 days for review prophylaxis  Increased lethargy Probably due to narcotic pain medications and  postop, and recommended strongly to use Norco sparingly for severe pain  Day of Discharge BP 97/69  Pulse 116  Temp(Src) 97.7 F (36.5 C) (Oral)  Resp 16  Ht 6' (1.829 m)  Wt 69.8 kg (153 lb 14.1 oz)  BMI 20.87 kg/m2  SpO2 96%  Physical Exam: General: Alert and awake oriented xself CVS: S1-S2 clear no murmur rubs or gallops Chest: clear to auscultation bilaterally, no wheezing rales or rhonchi Abdomen: soft nontender, nondistended, normal bowel sounds Extremities: no cyanosis, clubbing or edema noted bilaterally    The results of significant diagnostics from this hospitalization (including imaging, microbiology, ancillary and laboratory) are listed below for reference.    LAB RESULTS: Basic Metabolic Panel:  Recent Labs Lab 07/31/14 0557 08/01/14 0543  NA 137 140  K 4.2 4.2  CL 104 106  CO2 22 22  GLUCOSE 182* 147*  BUN 25* 22  CREATININE 1.15 1.14  CALCIUM 7.7* 7.9*   Liver Function Tests: No results found for this basename: AST, ALT, ALKPHOS, BILITOT, PROT, ALBUMIN,  in the last 168 hours No results found for this basename: LIPASE, AMYLASE,  in the last 168 hours No results found for this basename: AMMONIA,  in the last 168 hours CBC:  Recent Labs Lab 07/29/14 1700  07/31/14 0557 08/01/14 0543  WBC 15.5*  < > 10.0 9.1  NEUTROABS 13.1*  --   --   --   HGB 15.2  < > 10.5* 9.9*  HCT 44.1  < > 32.0* 29.8*  MCV 86.5  < > 87.9 87.9  PLT 141*  < > 88* 92*  < > = values in this interval not displayed. Cardiac Enzymes: No results found for this basename: CKTOTAL, CKMB, CKMBINDEX, TROPONINI,  in the last 168 hours BNP: No components found with this basename: POCBNP,  CBG:  Recent Labs Lab 08/02/14 0644 08/02/14 1105  GLUCAP 143* 108*    Significant Diagnostic Studies:  Dg Elbow Complete Left  07/29/2014   CLINICAL DATA:  fall today laceration and bleeding posterior and lateral left elbow laterally, patient uncooperative  EXAM: LEFT ELBOW - COMPLETE 3+  VIEW  COMPARISON:  None.  FINDINGS: Single lateral view submitted for interpretation. This study is not a true lateral and is therefore limited as well. No obvious fracture or dislocation. No obvious joint effusion. Mild olecranon enthesopathy.  IMPRESSION: Limited single image with no obvious acute abnormalities. Given that the study consists of only a single suboptimal image however the possibility of fracture is not excluded.   Electronically Signed   By: Skipper Cliche M.D.   On: 07/29/2014 16:36   Dg Hip Complete Left  07/30/2014   CLINICAL DATA:  Postoperative left hip pain after left hip arthroplasty.  EXAM: LEFT HIP - COMPLETE 2+ VIEW  COMPARISON:  07/29/2014  FINDINGS: Skin staples are present over the lateral soft  tissues. There is evidence of patient's left hip arthroplasty normally located and intact. Remainder of the exam is unchanged.  IMPRESSION: Postop change compatible recent left hip arthroplasty. No acute findings.   Electronically Signed   By: Marin Olp M.D.   On: 07/30/2014 14:08   Dg Hip Complete Left  07/29/2014   CLINICAL DATA:  Fall with left hip pain.  EXAM: LEFT HIP - COMPLETE 2+ VIEW  COMPARISON:  11/03/2010  FINDINGS: There is diffuse decreased bone mineralization. There are mild symmetric degenerative changes of the hips. There is a minimally displaced subcapital left femoral neck fracture. Left pubic rami difficult to evaluate due to patient positioning. There are degenerative changes of the spine. There is moderate fecal retention over the rectum.  IMPRESSION: Minimally displaced subcapital fracture of the left femoral neck.   Electronically Signed   By: Marin Olp M.D.   On: 07/29/2014 16:34   Dg Chest Port 1 View  07/29/2014   CLINICAL DATA:  Exam for medical clearance ; the patient is unable to provide any history; history of coronary artery disease  EXAM: PORTABLE CHEST - 1 VIEW  COMPARISON:  Portable chest x-ray of August 04, 2013  FINDINGS: The right lung is  well-expanded. There is an azygos lobe anatomy. On the left there is chronic deformity of the lateral aspect of the bony thorax. There is no focal infiltrate. The heart and pulmonary vascularity within the limits of normal. The patient has undergone previous CABG.  IMPRESSION: There is no acute cardiopulmonary abnormality. There is chronic deformity of the lateral aspect of the bony thorax on the left.   Electronically Signed   By: David  Martinique   On: 07/29/2014 17:42   Dg Femur Left Port  07/30/2014   CLINICAL DATA:  Left hip fracture requiring operative repair. Initial encounter.  EXAM: PORTABLE LEFT FEMUR - 2 VIEW  COMPARISON:  Left hip radiographs - 07/29/2014  FINDINGS: The attempted cross-table lateral radiograph of the left hip is degraded due to patient positioning and overlying osseous and soft tissue structures.  Grossly unchanged subcapital left femoral neck fracture with associated foreshortening and accentuated varus angulation. No definite intra-articular extension. Expected adjacent soft tissue swelling. Scattered vascular calcifications. No radiopaque foreign body.  No additional fractures identified. Limited visualization of the knee is normal given obliquity.  IMPRESSION: Grossly unchanged appearance of minimally displaced subcapital left femoral neck fracture. No additional fractures identified.   Electronically Signed   By: Sandi Mariscal M.D.   On: 07/30/2014 09:03       Disposition and Follow-up:     Discharge Instructions   Discharge instructions    Complete by:  As directed   Dysphagia 3 diet     Full weight bearing    Complete by:  As directed      Increase activity slowly    Complete by:  As directed             DISPOSITION: Skilled nursing facility DIET: Dysphagia 3 diet, nectar thick liquids   DISCHARGE FOLLOW-UP Follow-up Information   Follow up with BEANE,JEFFREY C, MD In 2 weeks.   Specialty:  Orthopedic Surgery   Contact information:   [address] [place] [address] [phone]       Follow up with Blanchie Serve, MD. Schedule an appointment as soon as possible for a visit in 1 week. (for hospital follow-up)    Specialty:  Internal Medicine   Contact information:   [address]  [place] [address] [phone]       Time spent on Discharge: 40 mins  Signed:   RAI,RIPUDEEP M.D. [facility] 08/02/2014, 12:31 PM Pager: [phone]   Addendum  In response to this CDI query, patient has unspecified protein calorie malnutrition   RAI,RIPUDEEP M.D. [facility] 08/03/2014, 2:12 PM  Pager: [phone]

## 2014-08-02 NOTE — Evaluation (Signed)
Physical Therapy Evaluation Patient Details Name: NYZAIAH KAI MRN: [id number] DOB: 08-02-1932 Today's Date: 08/02/2014   History of Present Illness  pt presents post fall with L hip fx.  pt with hx of Dementia.    Clinical Impression  Pt with minimal verbalizations and participation during PT.  Spoke with Nsg at [facility] who indicate pt was a 2 person A for any bed mobility and only pivoted to chair.  Pt's cognition seems to be at baseline per conversation with [facility].  Pt will need to return to SNF level of care.  No further acute PT needs at this time.  Will sign off.      Follow Up Recommendations SNF    Equipment Recommendations  None recommended by PT    Recommendations for Other Services       Precautions / Restrictions Precautions Precautions: Fall;Posterior Hip Precaution Booklet Issued: No Precaution Comments: pt not following any directions or participating in conversation.   Restrictions Weight Bearing Restrictions: Yes LLE Weight Bearing: Weight bearing as tolerated      Mobility  Bed Mobility Overal bed mobility: Needs Assistance;+2 for physical assistance Bed Mobility: Supine to Sit;Sit to Supine     Supine to sit: Total assist;+2 for physical assistance Sit to supine: Total assist;+2 for physical assistance   General bed mobility comments: pt did not participate in any bed mobility.    Transfers                    Ambulation/Gait                Stairs            Wheelchair Mobility    Modified Rankin (Stroke Patients Only)       Balance Overall balance assessment: History of Falls;Needs assistance Sitting-balance support: Bilateral upper extremity supported;Feet supported Sitting balance-Leahy Scale: Poor Sitting balance - Comments: pt initially requiring Max A to maintain sitting, then with time became MinA.                                       Pertinent Vitals/Pain Pain Assessment:  Faces Faces Pain Scale: Hurts little more Pain Intervention(s): Repositioned (RN present and states will get pain meds.  )    Home Living Family/patient expects to be discharged to:: Skilled nursing facility                      Prior Function Level of Independence: Needs assistance   Gait / Transfers Assistance Needed: Per staff at [facility] pt required 2 person A for transfers and was nonambulatory.    ADL's / Homemaking Assistance Needed: Staff performed all ADLs and pt was incontinent.          Hand Dominance        Extremity/Trunk Assessment   Upper Extremity Assessment: Generalized weakness           Lower Extremity Assessment: Generalized weakness;LLE deficits/detail   LLE Deficits / Details: pt with increased tension, ? guarding 2/2 pain vs hx of increased tone.    Cervical / Trunk Assessment: Kyphotic  Communication   Communication:  (pt with Dementia and minimal verbalizations.  )  Cognition Arousal/Alertness: Lethargic Behavior During Therapy: Flat affect Overall Cognitive Status: History of cognitive impairments - at baseline  General Comments      Exercises        Assessment/Plan    PT Assessment All further PT needs can be met in the next venue of care  PT Diagnosis Generalized weakness   PT Problem List    PT Treatment Interventions     PT Goals (Current goals can be found in the Care Plan section) Acute Rehab PT Goals PT Goal Formulation: No goals set, d/c therapy    Frequency     Barriers to discharge        Co-evaluation               End of Session   Activity Tolerance: Patient limited by lethargy Patient left: in bed;with call bell/phone within reach;with nursing/sitter in room Nurse Communication: Mobility status         Time: 1039-1050 PT Time Calculation (min): 11 min   Charges:   PT Evaluation $Initial PT Evaluation Tier I: 1 Procedure     PT G CodesCatarina Hartshorn, [place] 08/02/2014, 11:25 AM

## 2014-08-02 NOTE — Clinical Social Work Note (Signed)
CSW notified MD plans of dc.  Pt to dc today to [facility].  RN and RNCM aware.  CSW confirmed with Miquel Dunn re: pt return.  Pt will be transported via [facility].  CSW updated Enid Derry (pt main caregiver) regarding discharge plans today.  She is agreeable.  DC summary sent to [facility] and [facility] arranged for 3:30pm  Nonnie Done, [place] [phone]  Psychiatric & Orthopedics (5N 1-16) Clinical Social Worker

## 2014-08-02 NOTE — Progress Notes (Signed)
OT Cancellation Note  Patient Details Name: [name] MRN: [id number] DOB: 17-Mar-1932   Cancelled Treatment:    Reason Eval/Treat Not Completed: Other (comment) Pt is Medicare from SNF and current D/C plan is SNF. No apparent immediate acute care OT needs, therefore will defer OT to SNF. If OT eval is needed please call Acute Rehab Dept. at [phone] or text page OT at [phone].     Villa Herb M   Cyndie Chime, OTR/L Occupational Therapist [phone] (pager)  08/02/2014, 7:56 AM

## 2014-08-02 NOTE — Evaluation (Signed)
Clinical/Bedside Swallow Evaluation Patient Details  Name: [name] MRN: [id number] Date of Birth: [birth year]  Today's Date: 08/02/2014 Time: 1025-1050 SLP Time Calculation (min): 25 min  Past Medical History:  Past Medical History  Diagnosis Date  . Anxiety state, unspecified   . Arthritis     fingers/shoulders  . Unspecified cataract   . Colon polyps     last colonoscopy 2010  . CAD (coronary artery disease) 2006    s/p CABG  . Senile dementia, uncomplicated   . Depression   . HLD (hyperlipidemia)   . Gastric ulcer, unspecified as acute or chronic, without mention of hemorrhage, perforation, or obstruction   . Hypertension   . Insomnia   . TIA (transient ischemic attack)   . Clostridium difficile infection 2012    recurrent-last treatment 10/2010  . BPH (benign prostatic hypertrophy)   . H/O: hypothyroidism   . History of chronic pancreatitis   . Alcohol abuse, in remission     sober x years  . Paroxysmal a-fib     history of  . Hx: UTI (urinary tract infection)   . Diabetes mellitus type II     history of  . ARF (acute renal failure)     history of  . History of chicken pox   . Pulmonary nodule/lesion, solitary 05/2010    44mm L lung base nodule, if high risk rec f/u with CT 1 yr, if low risk, no f/u needed  . Muscle weakness (generalized)   . Lack of coordination   . Psychosis   . Episodic mood disorder   . Extrapyramidal disorder   . Altered mental status   . ALCOHOL ABUSE, HX OF 12/24/2006    Qualifier: Diagnosis of  By: [name]  MD, [name]    . COLON POLYP 12/24/2006    Qualifier: Diagnosis of  By: [name]    . Oral lesion 02/27/2011  . Subdural hematoma, post-traumatic 08/04/2013  . Protein-calorie malnutrition, severe 08/05/2013   Past Surgical History:  Past Surgical History  Procedure Laterality Date  . Coronary artery bypass graft  2006  . Injection knee  09/06/2001  . Cardiovascular stress test  05/2008    small regions of perfusion  abnormality suggestive of diaphragm artifact, exercise cap 7 METs  . Hip arthroplasty Left 07/30/2014    Procedure: ARTHROPLASTY BIPOLAR HIP;  Surgeon: Johnn Hai, MD;  Location: [place];  Service: Orthopedics;  Laterality: Left;   HPI:  80 yo adm to [facility] from SNF after fall with left hip fx.   Pt has baseline dementia and multiple medical issues.  Swallow evaluation was ordered due to pt's mental status.  CXR upon admit negative and CT head negative.     Assessment / Plan / Recommendation Clinical Impression  Pt presents with moderate oral dysphagia consistent with sequalea of dementia/dysphagia.  Although pt stays with oral cavity agape and does not verbally respond to SLP, he will intiate oral transit, swallowing when food/drink is placed in his mouth.  He also reflexively opens his mouth to indicate desire for more intake.    Oral deficits characterized by decreased oral coordination/lingual thrusting resulting in delayed oral transiting and suspected piecemealing.  Suspect possible delayed pharyngeal swallow initiation as well but pt appeared protective of his airway with nectar, applesauce and soft foods.  His aspiratiion risk will be chronic due to his dementia and reliance on others for feeding.    SLP phoned SNF but their SLP is not in yet and  this SLP was placed on hold when trying to contact nursing - aborted efforts.  Recommend conservative diet at this time until SLP can assure pt tolerance and that he is at baseline function.  Hopeful for rapid advancement with medical improvement.      Aspiration Risk    Moderate and ongoing   Diet Recommendation Dysphagia 3 (Mechanical Soft);Nectar-thick liquid   Liquid Administration via: Cup;Straw Medication Administration: Whole meds with puree Supervision: Trained caregiver to feed patient;Full supervision/cueing for compensatory strategies Compensations: Slow rate;Small sips/bites (assure pt swallows before giving more, delayed  swallow) Postural Changes and/or Swallow Maneuvers: Seated upright 90 degrees;Upright 30-60 min after meal    Other  Recommendations Oral Care Recommendations: Oral care BID Other Recommendations: Order thickener from pharmacy   Follow Up Recommendations  Skilled Nursing facility    Frequency and Duration min 1 x/week  1 week   Pertinent Vitals/Pain Afebrile, decreased      Swallow Study Prior Functional Status   on soft diet at SNF per their orders, could not reach SNF staff    General Date of Onset: 08/02/14 HPI: 80 yo adm to [facility] from SNF after fall with left hip fx.   Pt has baseline dementia and multiple medical issues.  Swallow evaluation was ordered due to pt's mental status.  CXR upon admit negative and CT head negative.   Type of Study: Bedside swallow evaluation Diet Prior to this Study: Regular;Thin liquids Temperature Spikes Noted: No Respiratory Status: Room air History of Recent Intubation: No Behavior/Cognition: Hard of hearing Oral Cavity - Dentition: Edentulous Self-Feeding Abilities: Total assist Patient Positioning: Upright in bed Baseline Vocal Quality: Low vocal intensity Volitional Cough: Cognitively unable to elicit Volitional Swallow: Unable to elicit    Oral/Motor/Sensory Function Overall Oral Motor/Sensory Function:  (pt did not follow commands, ? mild facial droop on left, pt able to seal lips on spoon but generally stays with open mouth posture)   Ice Chips Ice chips: Not tested Other Comments: due to aspiration risk from oral deficits   Thin Liquid Thin Liquid: Impaired Presentation: Spoon Oral Phase Impairments: Reduced lingual movement/coordination Oral Phase Functional Implications: Prolonged oral transit Pharyngeal  Phase Impairments: Cough - Immediate;Multiple swallows    Nectar Thick Nectar Thick Liquid: Impaired Presentation: Straw;Spoon;Cup Oral Phase Impairments: Impaired anterior to posterior transit;Reduced lingual  movement/coordination Oral phase functional implications: Prolonged oral transit Pharyngeal Phase Impairments: Suspected delayed Swallow;Multiple swallows   Honey Thick Honey Thick Liquid: Not tested   Puree Puree: Impaired Presentation: Spoon Oral Phase Impairments: Reduced lingual movement/coordination;Impaired anterior to posterior transit Oral Phase Functional Implications: Prolonged oral transit Pharyngeal Phase Impairments: Suspected delayed Swallow   Solid   GO    Solid: Impaired Oral Phase Impairments: Reduced lingual movement/coordination;Impaired anterior to posterior transit;Impaired mastication Pharyngeal Phase Impairments: Suspected delayed Swallow;Multiple swallows      Luanna Salk, [facility] SLP [phone]

## 2014-08-03 ENCOUNTER — Non-Acute Institutional Stay (SKILLED_NURSING_FACILITY): Payer: Medicare Other | Admitting: Internal Medicine

## 2014-08-03 ENCOUNTER — Other Ambulatory Visit: Payer: Self-pay | Admitting: *Deleted

## 2014-08-03 ENCOUNTER — Encounter: Payer: Self-pay | Admitting: Internal Medicine

## 2014-08-03 DIAGNOSIS — G4700 Insomnia, unspecified: Secondary | ICD-10-CM

## 2014-08-03 DIAGNOSIS — R1314 Dysphagia, pharyngoesophageal phase: Secondary | ICD-10-CM

## 2014-08-03 DIAGNOSIS — F03918 Unspecified dementia, unspecified severity, with other behavioral disturbance: Secondary | ICD-10-CM

## 2014-08-03 DIAGNOSIS — F0391 Unspecified dementia with behavioral disturbance: Secondary | ICD-10-CM

## 2014-08-03 DIAGNOSIS — D62 Acute posthemorrhagic anemia: Secondary | ICD-10-CM

## 2014-08-03 DIAGNOSIS — K5900 Constipation, unspecified: Secondary | ICD-10-CM

## 2014-08-03 DIAGNOSIS — E559 Vitamin D deficiency, unspecified: Secondary | ICD-10-CM

## 2014-08-03 DIAGNOSIS — S72012S Unspecified intracapsular fracture of left femur, sequela: Secondary | ICD-10-CM

## 2014-08-03 MED ORDER — HYDROCODONE-ACETAMINOPHEN 5-325 MG PO TABS: ORAL_TABLET | ORAL | Status: DC

## 2014-08-03 NOTE — Telephone Encounter (Signed)
[facility] 

## 2014-08-06 ENCOUNTER — Encounter: Payer: Self-pay | Admitting: Internal Medicine

## 2014-08-06 DIAGNOSIS — F0391 Unspecified dementia with behavioral disturbance: Secondary | ICD-10-CM | POA: Insufficient documentation

## 2014-08-06 DIAGNOSIS — F03918 Unspecified dementia, unspecified severity, with other behavioral disturbance: Secondary | ICD-10-CM | POA: Insufficient documentation

## 2014-08-06 NOTE — Progress Notes (Signed)
This encounter was created in error - please disregard.

## 2014-08-06 NOTE — Progress Notes (Signed)
Patient ID: [name], male   DOB: 1932/01/13, 80 y.o.   MRN: [id number]     Facility: [facility]    PCP: Blanchie Serve, MD  Code Status: full code  Allergies  Allergen Reactions  . Codeine     REACTION: Rash,itching    Chief Complaint: new admission  HPI:  80 y/o male patient is here after hospital admission from 07/29/14-08/02/14 with a fall with left hip fracture. He underwent left hip arthroplasty. He had been a long term care resident in the facility prior to hospitalization. He has dementia making history taking and ROS limited. He is sleepy today and gives vague yes / no answers. He appears in no distress. No new concern from staff.  He has hx of HTN, constipation, chronic low back pain  Review of Systems:  Unable to obtain   Past Medical History  Diagnosis Date  . Anxiety state, unspecified   . Arthritis     fingers/shoulders  . Unspecified cataract   . Colon polyps     last colonoscopy 2010  . CAD (coronary artery disease) 2006    s/p CABG  . Senile dementia, uncomplicated   . Depression   . HLD (hyperlipidemia)   . Gastric ulcer, unspecified as acute or chronic, without mention of hemorrhage, perforation, or obstruction   . Hypertension   . Insomnia   . TIA (transient ischemic attack)   . Clostridium difficile infection 2012    recurrent-last treatment 10/2010  . BPH (benign prostatic hypertrophy)   . H/O: hypothyroidism   . History of chronic pancreatitis   . Alcohol abuse, in remission     sober x years  . Paroxysmal a-fib     history of  . Hx: UTI (urinary tract infection)   . Diabetes mellitus type II     history of  . ARF (acute renal failure)     history of  . History of chicken pox   . Pulmonary nodule/lesion, solitary 05/2010    45mm L lung base nodule, if high risk rec f/u with CT 1 yr, if low risk, no f/u needed  . Muscle weakness (generalized)   . Lack of coordination   . Psychosis   . Episodic mood  disorder   . Extrapyramidal disorder   . Altered mental status   . ALCOHOL ABUSE, HX OF 12/24/2006    Qualifier: Diagnosis of  By: [name]  MD, [name]    . COLON POLYP 12/24/2006    Qualifier: Diagnosis of  By: [name]    . Oral lesion 02/27/2011  . Subdural hematoma, post-traumatic 08/04/2013  . Protein-calorie malnutrition, severe 08/05/2013   Past Surgical History  Procedure Laterality Date  . Coronary artery bypass graft  2006  . Injection knee  09/06/2001  . Cardiovascular stress test  05/2008    small regions of perfusion abnormality suggestive of diaphragm artifact, exercise cap 7 METs  . Hip arthroplasty Left 07/30/2014    Procedure: ARTHROPLASTY BIPOLAR HIP;  Surgeon: Johnn Hai, MD;  Location: [place];  Service: Orthopedics;  Laterality: Left;   Social History:   reports that he quit smoking about 8 years ago. He does not have any smokeless tobacco history on file. He reports that he does not drink alcohol or use illicit drugs.  Family History  Problem Relation Age of Onset  . Dementia Father     ? Alzheimers  . Heart attack Father   . Hypertension Brother   .  Diabetes Brother     Medications: Patient's Medications  New Prescriptions   No medications on file  Previous Medications   ARIPIPRAZOLE (ABILIFY) 2 MG TABLET    Take 2 mg by mouth daily.   CHOLECALCIFEROL (VITAMIN D) 1000 UNITS TABLET    Take 1,000 Units by mouth daily.   DOCUSATE SODIUM (COLACE) 100 MG CAPSULE    Take 1 capsule (100 mg total) by mouth 2 (two) times daily as needed for mild constipation.   ENOXAPARIN (LOVENOX) 30 MG/0.3ML INJECTION    Inject 0.3 mLs (30 mg total) into the skin daily.   HYDROCODONE-ACETAMINOPHEN (NORCO/VICODIN) 5-325 MG PER TABLET    Take one tablet by mouth every 4 hours as needed for pain   LORATADINE (CLARITIN) 10 MG TABLET    Take 10 mg by mouth daily.   MELATONIN 3 MG TABS    Take 3 mg by mouth at bedtime.   MEMANTINE HCL ER (NAMENDA XR) 28 MG CP24    Take 28  mg by mouth daily.   MULTIPLE VITAMINS-MINERALS (CERTAGEN PO)    Take 1 tablet by mouth daily.   RIVASTIGMINE (EXELON) 13.3 MG/24HR PT24    Place 13.3 mg onto the skin daily.   SERTRALINE (ZOLOFT) 100 MG TABLET    Take 100 mg by mouth daily.  Modified Medications   No medications on file  Discontinued Medications   No medications on file     Physical Exam: Filed Vitals:   08/03/14 1113  BP: 112/62  Pulse: 85  Temp: 98 F (36.7 C)  Resp: 18    General- elderly male in no acute distress, frail Head- atraumatic, normocephalic Eyes- PERRLA, EOMI, no pallor, no icterus Neck- no lymphadenopathy Cardiovascular- normal s1,s2, no murmurs Respiratory- bilateral clear to auscultation, no wheeze, no rhonchi, no crackles Abdomen- bowel sounds present, soft, non tender Musculoskeletal- limited ROM mainly on left hip region, left leg in immobilizer Skin- suture in left elbow laceration  Neurological- alert but confused   Labs reviewed: Basic Metabolic Panel:  Recent Labs  07/29/14 1716 07/31/14 0557 08/01/14 0543  NA 143 137 140  K 4.1 4.2 4.2  CL 107 104 106  CO2  --  22 22  GLUCOSE 134* 182* 147*  BUN 29* 25* 22  CREATININE 1.30 1.15 1.14  CALCIUM  --  7.7* 7.9*   CBC:  Recent Labs  07/29/14 1700  07/30/14 1500 07/31/14 0557 08/01/14 0543  WBC 15.5*  --  13.3* 10.0 9.1  NEUTROABS 13.1*  --   --   --   --   HGB 15.2  < > 12.1* 10.5* 9.9*  HCT 44.1  < > 35.6* 32.0* 29.8*  MCV 86.5  --  87.5 87.9 87.9  PLT 141*  --  105* 88* 92*  < > = values in this interval not displayed.  CBG:  Recent Labs  08/02/14 0644 08/02/14 1105 08/02/14 1625  GLUCAP 143* 108* 206*    Radiological Exams: Dg Elbow Complete Left  07/29/2014   CLINICAL DATA:  fall today laceration and bleeding posterior and lateral left elbow laterally, patient uncooperative  EXAM: LEFT ELBOW - COMPLETE 3+ VIEW  COMPARISON:  None.  FINDINGS: Single lateral view submitted for interpretation. This  study is not a true lateral and is therefore limited as well. No obvious fracture or dislocation. No obvious joint effusion. Mild olecranon enthesopathy.  IMPRESSION: Limited single image with no obvious acute abnormalities. Given that the study consists of only a single suboptimal image  however the possibility of fracture is not excluded.   Electronically Signed   By: Skipper Cliche M.D.   On: 07/29/2014 16:36   Dg Hip Complete Left  07/30/2014   CLINICAL DATA:  Postoperative left hip pain after left hip arthroplasty.  EXAM: LEFT HIP - COMPLETE 2+ VIEW  COMPARISON:  07/29/2014  FINDINGS: Skin staples are present over the lateral soft tissues. There is evidence of patient's left hip arthroplasty normally located and intact. Remainder of the exam is unchanged.  IMPRESSION: Postop change compatible recent left hip arthroplasty. No acute findings.  Electronically Signed   By: Marin Olp M.D.   On: 07/30/2014 14:08   Dg Hip Complete Left  07/29/2014   CLINICAL DATA:  Fall with left hip pain.  EXAM: LEFT HIP - COMPLETE 2+ VIEW  COMPARISON:  11/03/2010  FINDINGS: There is diffuse decreased bone mineralization. There are mild symmetric degenerative changes of the hips. There is a minimally displaced subcapital left femoral neck fracture. Left pubic rami difficult to evaluate due to patient positioning. There are degenerative changes of the spine. There is moderate fecal retention over the rectum.  IMPRESSION: Minimally displaced subcapital fracture of the left femoral neck.   Electronically Signed   By: Marin Olp M.D.   On: 07/29/2014 16:34   Dg Chest Port 1 View  07/29/2014   CLINICAL DATA:  Exam for medical clearance ; the patient is unable to provide any history; history of coronary artery disease  EXAM: PORTABLE CHEST - 1 VIEW  COMPARISON:  Portable chest x-ray of August 04, 2013  FINDINGS: The right lung is well-expanded. There is an azygos lobe anatomy. On the left there is chronic deformity of the  lateral aspect of the bony thorax. There is no focal infiltrate. The heart and pulmonary vascularity within the limits of normal. The patient has undergone previous CABG.  IMPRESSION: There is no acute cardiopulmonary abnormality. There is chronic deformity of the lateral aspect of the bony thorax on the left.   Electronically Signed   By: David  Martinique   On: 07/29/2014 17:42   Dg Femur Left Port  07/30/2014   CLINICAL DATA:  Left hip fracture requiring operative repair. Initial encounter. EXAM: PORTABLE LEFT FEMUR - 2 VIEW  COMPARISON:  Left hip radiographs - 07/29/2014  FINDINGS: The attempted cross-table lateral radiograph of the left hip is degraded due to patient positioning and overlying osseous and soft tissue structures.  Grossly unchanged subcapital left femoral neck fracture with associated foreshortening and accentuated varus angulation. No definite intra-articular extension. Expected adjacent soft tissue swelling. Scattered vascular calcifications. No radiopaque foreign body.  No additional fractures identified. Limited visualization of the knee is normal given obliquity.  IMPRESSION: Grossly unchanged appearance of minimally displaced subcapital left femoral neck fracture. No additional fractures identified.   Electronically Signed   By: Sandi Mariscal M.D.   On: 07/30/2014 09:03    Assessment/Plan  Subcapital fracture of left hip S/p left hip arthroplasty, to take orthopedic precaution, Will have patient work with PT/OT as tolerated to regain strength and restore function.  Fall precautions are in place. Continue prn norco for pain and monitor alertness level.Continue lovenox for 10 more days for dvt prophylaxis. Continue vitamin d supplement.  Anemia Likely post op, monitor h&h  Dysphagia Aspiration precautions. Continue mechanical soft diet and nectar thick liquid  Dementia with behavioral disturbance Stable, decline anticipated. Continue assistance with ADLs. Falls precautions.  Continue exelon and namenda. Continue his psychosis meds- abilify and  zoloft  vitd def Continue vit d supplement  Insomnia Continue melatonin 3 mg qhs   Constipation With him on norco, continue colace and monitor bowel movement.      Goals of care: long term care  Labs/tests ordered: cbc, cmet, lipid, a1c, vit d    Blanchie Serve, MD  [facility] [phone] (Monday-Friday 8 am - 5 pm) [phone] (afterhours)

## 2014-08-29 LAB — BASIC METABOLIC PANEL
BUN: 17 mg/dL (ref 4–21)
Creatinine: 0.9 mg/dL (ref 0.6–1.3)
Glucose: 84 mg/dL
Potassium: 4.2 mmol/L (ref 3.4–5.3)
Sodium: 138 mmol/L (ref 137–147)

## 2014-08-29 LAB — CBC AND DIFFERENTIAL
HCT: 43 % (ref 41–53)
Hemoglobin: 13.6 g/dL (ref 13.5–17.5)
Platelets: 171 10*3/uL (ref 150–399)
WBC: 9.4 10^3/mL

## 2014-08-29 LAB — HEPATIC FUNCTION PANEL
ALT: 10 U/L (ref 10–40)
AST: 16 U/L (ref 14–40)
Alkaline Phosphatase: 201 U/L — AB (ref 25–125)
Bilirubin, Direct: 0.1 mg/dL (ref 0.01–0.4)
Bilirubin, Total: 0.9 mg/dL

## 2014-09-05 ENCOUNTER — Non-Acute Institutional Stay (SKILLED_NURSING_FACILITY): Payer: Medicare Other | Admitting: Adult Health

## 2014-09-05 DIAGNOSIS — F0150 Vascular dementia without behavioral disturbance: Secondary | ICD-10-CM

## 2014-09-05 DIAGNOSIS — F0393 Unspecified dementia, unspecified severity, with mood disturbance: Secondary | ICD-10-CM

## 2014-09-05 DIAGNOSIS — G309 Alzheimer's disease, unspecified: Secondary | ICD-10-CM

## 2014-09-05 DIAGNOSIS — F0280 Dementia in other diseases classified elsewhere without behavioral disturbance: Secondary | ICD-10-CM

## 2014-09-05 DIAGNOSIS — J309 Allergic rhinitis, unspecified: Secondary | ICD-10-CM

## 2014-09-05 DIAGNOSIS — F29 Unspecified psychosis not due to a substance or known physiological condition: Secondary | ICD-10-CM

## 2014-09-05 DIAGNOSIS — F329 Major depressive disorder, single episode, unspecified: Secondary | ICD-10-CM

## 2014-09-05 DIAGNOSIS — I10 Essential (primary) hypertension: Secondary | ICD-10-CM

## 2014-09-05 DIAGNOSIS — S72012S Unspecified intracapsular fracture of left femur, sequela: Secondary | ICD-10-CM

## 2014-09-06 LAB — HEMOGLOBIN A1C: HEMOGLOBIN A1C: 5.8 % (ref 4.0–6.0)

## 2014-09-14 ENCOUNTER — Encounter: Payer: Self-pay | Admitting: Adult Health

## 2014-09-14 DIAGNOSIS — I10 Essential (primary) hypertension: Secondary | ICD-10-CM | POA: Insufficient documentation

## 2014-09-14 NOTE — Progress Notes (Signed)
Patient ID: [name], male   DOB: 1931/11/03, 80 y.o.   MRN: [id number]  [facility]     Allergies  Allergen Reactions  . Codeine     REACTION: Rash,itching       Chief Complaint  Patient presents with  . Medical Management of Chronic Issues    HPI:  He is a long term resident of this facility being seen for the management of his chronic illnesses. He has had a left hip fracture this past month. Overall his status is stable at this time. He is unable to participate in the hpi or ros. There are no concerns being voiced by the nursing staff at this time.     Past Medical History  Diagnosis Date  . Anxiety state, unspecified   . Arthritis     fingers/shoulders  . Unspecified cataract   . Colon polyps     last colonoscopy 2010  . CAD (coronary artery disease) 2006    s/p CABG  . Senile dementia, uncomplicated   . Depression   . HLD (hyperlipidemia)   . Gastric ulcer, unspecified as acute or chronic, without mention of hemorrhage, perforation, or obstruction   . Hypertension   . Insomnia   . TIA (transient ischemic attack)   . Clostridium difficile infection 2012    recurrent-last treatment 10/2010  . BPH (benign prostatic hypertrophy)   . H/O: hypothyroidism   . History of chronic pancreatitis   . Alcohol abuse, in remission     sober x years  . Paroxysmal a-fib     history of  . Hx: UTI (urinary tract infection)   . Diabetes mellitus type II     history of  . ARF (acute renal failure)     history of  . History of chicken pox   . Pulmonary nodule/lesion, solitary 05/2010    36m L lung base nodule, if high risk rec f/u with CT 1 yr, if low risk, no f/u needed  . Muscle weakness (generalized)   . Lack of coordination   . Psychosis   . Episodic mood disorder   . Extrapyramidal disorder   . Altered mental status   . ALCOHOL ABUSE, HX OF 12/24/2006    Qualifier: Diagnosis of  By: GDanise Mina MD, JGarlon Hatchet   . COLON POLYP 12/24/2006    Qualifier: Diagnosis  of  By: MEusebio Friendly   . Oral lesion 02/27/2011  . Subdural hematoma, post-traumatic 08/04/2013  . Protein-calorie malnutrition, severe 08/05/2013    Past Surgical History  Procedure Laterality Date  . Coronary artery bypass graft  2006  . Injection knee  09/06/2001  . Cardiovascular stress test  05/2008    small regions of perfusion abnormality suggestive of diaphragm artifact, exercise cap 7 METs  . Hip arthroplasty Left 07/30/2014    Procedure: ARTHROPLASTY BIPOLAR HIP;  Surgeon: JJohnn Hai MD;  [facility]  Service: Orthopedics;  Laterality: Left;    VITAL SIGNS BP 124/68 mmHg  Pulse 72  Ht 6' 1"  (1.854 m)  Wt 152 lb 4.8 oz (69.083 kg)  BMI 20.10 kg/m2   Outpatient Encounter Prescriptions as of 09/05/2014  Medication Sig  . ARIPiprazole (ABILIFY) 2 MG tablet Take 2 mg by mouth daily.  . cholecalciferol (VITAMIN D) 1000 UNITS tablet Take 1,000 Units by mouth daily.  .Marland Kitchendocusate sodium (COLACE) 100 MG capsule Take 1 capsule (100 mg total) by mouth 2 (two) times daily as needed for mild constipation.  .Marland KitchenHYDROcodone-acetaminophen (  NORCO/VICODIN) 5-325 MG per tablet Take one tablet by mouth every 4 hours as needed for pain  . loratadine (CLARITIN) 10 MG tablet Take 10 mg by mouth daily.  . Melatonin 3 MG TABS Take 3 mg by mouth at bedtime.  . Memantine HCl ER (NAMENDA XR) 28 MG CP24 Take 28 mg by mouth daily.  . Multiple Vitamins-Minerals (CERTAGEN PO) Take 1 tablet by mouth daily.  . Rivastigmine (EXELON) 13.3 MG/24HR PT24 Place 13.3 mg onto the skin daily.  . sertraline (ZOLOFT) 100 MG tablet Take 100 mg by mouth daily.  . [DISCONTINUED] enoxaparin (LOVENOX) 30 MG/0.3ML injection Inject 0.3 mLs (30 mg total) into the skin daily.     SIGNIFICANT DIAGNOSTIC EXAMS  07-29-14: left elbow x-ray: Limited single image with no obvious acute abnormalities. Given that the study consists of only a single suboptimal image however the possibility of fracture is not  excluded  07-29-14: left hip x-ray: Minimally displaced subcapital fracture of the left femoral neck  07-29-14: chest x-ray: There is no acute cardiopulmonary abnormality. There is chronic deformity of the lateral aspect of the bony thorax on the left.  07-30-14: left hip x-ray: Postop change compatible recent left hip arthroplasty. No acute findings     LABS REVIEWED   11-15-13: chol 131; ldl 67; trig 97 01-18-14: urine culture: klebsiella pneumoniae: levaquin 03-16-14: wbc 7.5; hgb 15.4; hct 49.1; mcv 91.6; plt 195; glucose 71; bun 26; creat 1.0; k+4.4; na++142; liver normal albumin 4.3; chol 200; ldl 132; trig 110; folate >25; vit b12: 1338; vit d 28.67;   hgb a1c 5.9   07-29-14: wbc 15.5; hgb 15.2; hct 44.1; mcv 86.5; plt 141; plt 134; glucose 134; bun 29; creat 1.3; k+4.1; na++143 07-31-14: wbc 10.0; hgb 10.5; hct 32.0; mcv 81.9; plt 88; glucose 182; bun 25; creat 1.15; k+ 4.2; na++137; hgb 5.8 08-01-14: vit b12: 468; folate 19.2; iron 12; tibc 186; ferritin 462  08-29-14: wbc 9.4; hgb 13.6; hct 42.9; mcv 91.1; plt 171; glucose 84; bun 17; creat 0.9; k4.2; na++138; alk phos 201; ast 16; alt 10; albumin 4.0;    Review of Systems  Unable to perform ROS    Physical Exam  Constitutional: No distress.  Is frail   Neck: Neck supple. No tracheal deviation present. No thyromegaly present.  Cardiovascular: Normal rate, regular rhythm and intact distal pulses.   Respiratory: Effort normal and breath sounds normal. No respiratory distress. He has no wheezes.  GI: Soft. Bowel sounds are normal. He exhibits no distension. There is no tenderness.  Musculoskeletal: He exhibits no edema.  Is able to move all extremities; has left knee immobilizer in place   Neurological: He is alert.  Skin: Skin is warm and dry. He is not diaphoretic.  Stage III left heel ulcer: 0.2 x 0.2 x 0.2 cm 100 % granulation        ASSESSMENT/ PLAN:  1. subcapital left hip fracture: he continues to use knee  immobilizer to left leg. He is out of bed to geri-chair. He has vicodin 5/325 mg every 4 hours as needed for pain will monitor his status.   2. Dementia: no significant change in status; will continue exelon patch 13.3 mg daily; and namenda xr 28 mg daily will monitor   3. Depression: is presently stable will continue zoloft 100 mg daily   4. Psychosis: his behaviors are stable; he is rarely acting at this time; he continues to be followed by psych services. Will continue his abilify 2 mg  daily and will monitor his status.   5. Allergic rhinitis: will continue claritin daily   6. Hypertension: he is presently stable; is not presently taking any medications; will not make any medication changes at this time; will monitor his status.       Ok Edwards NP [facility]  Contact [phone] Monday through Friday 8am- 5pm  After hours call [phone]

## 2014-10-04 ENCOUNTER — Encounter: Payer: Self-pay | Admitting: Registered Nurse

## 2014-10-04 ENCOUNTER — Non-Acute Institutional Stay (SKILLED_NURSING_FACILITY): Payer: Medicare Other | Admitting: Registered Nurse

## 2014-10-04 DIAGNOSIS — F329 Major depressive disorder, single episode, unspecified: Secondary | ICD-10-CM

## 2014-10-04 DIAGNOSIS — F0280 Dementia in other diseases classified elsewhere without behavioral disturbance: Secondary | ICD-10-CM

## 2014-10-04 DIAGNOSIS — S72012S Unspecified intracapsular fracture of left femur, sequela: Secondary | ICD-10-CM

## 2014-10-04 DIAGNOSIS — J309 Allergic rhinitis, unspecified: Secondary | ICD-10-CM

## 2014-10-04 DIAGNOSIS — L8993 Pressure ulcer of unspecified site, stage 3: Secondary | ICD-10-CM

## 2014-10-04 DIAGNOSIS — F0391 Unspecified dementia with behavioral disturbance: Secondary | ICD-10-CM

## 2014-10-04 DIAGNOSIS — F03918 Unspecified dementia, unspecified severity, with other behavioral disturbance: Secondary | ICD-10-CM

## 2014-10-04 DIAGNOSIS — F0393 Unspecified dementia, unspecified severity, with mood disturbance: Secondary | ICD-10-CM

## 2014-10-04 DIAGNOSIS — E559 Vitamin D deficiency, unspecified: Secondary | ICD-10-CM

## 2014-10-04 DIAGNOSIS — K5909 Other constipation: Secondary | ICD-10-CM

## 2014-10-04 DIAGNOSIS — G4700 Insomnia, unspecified: Secondary | ICD-10-CM

## 2014-10-04 NOTE — Progress Notes (Signed)
Patient ID: [name], male   DOB: 1931-12-26, 80 y.o.   MRN: [id number]   Place of Service: [facility]  Allergies  Allergen Reactions  . Codeine     REACTION: Rash,itching    Code Status: Full Code  Goals of Care: Longevity/LTC  Chief Complaint  Patient presents with  . Medical Management of Chronic Issues    dementia, s/p left hip replacement, depression,  AR, constipation, insomnia     HPI 80 y.o. 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 Weight stable. No recent fall reported. No change in behaviors or functional status reported. Stage 3 left heel pressure ulcer resolved on 09/28/14. No concerns from staff. No complaints verbalized from patient. He is sleeping well on melatonin 3mg  daily at bedtime. Depression is stable on zoloft, and pain associated with left hip fracture is adequately controlled with prn norco.   Review of Systems Unable to obtain due to dementia  Past Medical History  Diagnosis Date  . Anxiety state, unspecified   . Arthritis     fingers/shoulders  . Unspecified cataract   . Colon polyps     last colonoscopy 2010  . CAD (coronary artery disease) 2006    s/p CABG  . Senile dementia, uncomplicated   . Depression   . HLD (hyperlipidemia)   . Gastric ulcer, unspecified as acute or chronic, without mention of hemorrhage, perforation, or obstruction   . Hypertension   . Insomnia   . TIA (transient ischemic attack)   . Clostridium difficile infection 2012    recurrent-last treatment 10/2010  . BPH (benign prostatic hypertrophy)   . H/O: hypothyroidism   . History of chronic pancreatitis   . Alcohol abuse, in remission     sober x years  . Paroxysmal a-fib     history of  . Hx: UTI (urinary tract infection)   . Diabetes  mellitus type II     history of  . ARF (acute renal failure)     history of  . History of chicken pox   . Pulmonary nodule/lesion, solitary 05/2010    79mm L lung base nodule, if high risk rec f/u with CT 1 yr, if low risk, no f/u needed  . Muscle weakness (generalized)   . Lack of coordination   . Psychosis   . Episodic mood disorder   . Extrapyramidal disorder   . Altered mental status   . ALCOHOL ABUSE, HX OF 12/24/2006    Qualifier: Diagnosis of  By: [name]  MD, [name]    . COLON POLYP 12/24/2006    Qualifier: Diagnosis of  By: [name]    . Oral lesion 02/27/2011  . Subdural hematoma, post-traumatic 08/04/2013  . Protein-calorie malnutrition, severe 08/05/2013    Past Surgical History  Procedure Laterality Date  . Coronary artery bypass graft  2006  . Injection knee  09/06/2001  . Cardiovascular stress test  05/2008    small regions of perfusion abnormality suggestive of diaphragm artifact, exercise cap 7 METs  . Hip arthroplasty Left 07/30/2014    Procedure: ARTHROPLASTY BIPOLAR HIP;  Surgeon: Johnn Hai, MD;  Location: [facility];  Service: Orthopedics;  Laterality: Left;    History   Social History  . Marital Status: Widowed    Spouse Name: N/A  Number of Children: N/A  . Years of Education: N/A   Occupational History  . Retired Administrator, disability for back pain    Social History Main Topics  . Smoking status: Former Smoker    Quit date: 10/27/2005  . Smokeless tobacco: Not on file  . Alcohol Use: No     Comment: Alcoholism-Sober-Quit x 3 years  . Drug Use: No     Comment: MJ-Quit x3 years  . Sexual Activity: No   Other Topics Concern  . Not on file   Social History Narrative   Lives with daughter and her fiance      8th grade education    Family History  Problem Relation Age of Onset  . Dementia Father     ? Alzheimers  . Heart attack Father   . Hypertension Brother   . Diabetes Brother       Medication List       This list  is accurate as of: 10/04/14 10:11 AM.  Always use your most recent med list.               ARIPiprazole 2 MG tablet  Commonly known as:  ABILIFY  Take 2 mg by mouth daily.     CERTAGEN PO  Take 1 tablet by mouth daily.     cholecalciferol 1000 UNITS tablet  Commonly known as:  VITAMIN D  Take 1,000 Units by mouth daily.     docusate sodium 100 MG capsule  Commonly known as:  COLACE  Take 1 capsule (100 mg total) by mouth 2 (two) times daily as needed for mild constipation.     EXELON 13.3 MG/24HR Pt24  Generic drug:  Rivastigmine  Place 13.3 mg onto the skin daily.     HYDROcodone-acetaminophen 5-325 MG per tablet  Commonly known as:  NORCO/VICODIN  Take one tablet by mouth every 4 hours as needed for pain     loratadine 10 MG tablet  Commonly known as:  CLARITIN  Take 10 mg by mouth daily.     Melatonin 3 MG Tabs  Take 3 mg by mouth at bedtime.     NAMENDA XR 28 MG Cp24  Generic drug:  Memantine HCl ER  Take 28 mg by mouth daily.     sertraline 25 MG tablet  Commonly known as:  ZOLOFT  Take 75 mg by mouth daily.        Physical Exam  BP 119/73 mmHg  Pulse 62  Temp(Src) 97.6 F (36.4 C)  Resp 20  Ht 6\' 1"  (1.854 m)  Wt 143 lb 6.4 oz (65.046 kg)  BMI 18.92 kg/m2  Constitutional: thin/frail elderly male in no acute distress. Able to follow simple commands. Sitting in [employer]. Poor eye contact HEENT: Normocephalic and atraumatic. PERRL. EOM intact. No icterus. Oral mucosa moist. Posterior pharynx clear of any exudate or lesions.   Neck: Supple and nontender. No lymphadenopathy, masses, or thyromegaly. No JVD or carotid bruits. Cardiac: Normal S1, S2. RRR without appreciable murmurs, rubs, or gallops. Distal pulses intact. No dependent edema.  Lungs: No respiratory distress. Breath sounds clear bilaterally without rales, rhonchi, or wheezes. Abdomen: Audible bowel sounds in all quadrants. Soft, nontender, nondistended.  Musculoskeletal: able to move all  extremities. No joint erythema or tenderness. LLE has soft boot in place Skin: Warm and dry. No rash noted. No erythema.  Neurological: Alert  Psychiatric: Slightly restless in chair.    Labs Reviewed  CBC Latest Ref Rng 08/29/2014 08/01/2014 07/31/2014  WBC - 9.4 9.1 10.0  Hemoglobin 13.5 - 17.5 g/dL 13.6 9.9(L) 10.5(L)  Hematocrit 41 - 53 % 43 29.8(L) 32.0(L)  Platelets 150 - 399 K/L 171 92(L) 88(L)    CMP Latest Ref Rng 08/29/2014 08/01/2014 07/31/2014  Glucose 70 - 99 mg/dL - 147(H) 182(H)  BUN 4 - 21 mg/dL 17 22 25(H)  Creatinine 0.6 - 1.3 mg/dL 0.9 1.14 1.15  Sodium 137 - 147 mmol/L 138 140 137  Potassium 3.4 - 5.3 mmol/L 4.2 4.2 4.2  Chloride 96 - 112 mEq/L - 106 104  CO2 19 - 32 mEq/L - 22 22  Calcium 8.4 - 10.5 mg/dL - 7.9(L) 7.7(L)  Total Protein 6.0 - 8.3 g/dL - - -  Albumin - - - -  Total Bilirubin 0.3 - 1.2 mg/dL - - -  Alkaline Phos 25 - 125 U/L 201(A) - -  AST 14 - 40 U/L 16 - -  ALT 10 - 40 U/L 10 - -    Lab Results  Component Value Date   HGBA1C 5.8 09/06/2014    Lab Results  Component Value Date   TSH 0.60 01/27/2011    Lipid Panel     Component Value Date/Time   CHOL 131 11/15/2013   TRIG 97 11/15/2013   HDL 45 11/15/2013   CHOLHDL 2.6 11/13/2009 0500   VLDL 16 11/13/2009 0500   LDLCALC 67 11/15/2013   LDLDIRECT 75.4 01/27/2011 1122   Assessment & Plan 1. Depression due to dementia Stable. Continue zoloft 75mg  daily and monitor for change in mood  2. Other constipation Stable. Continue colace 100mg  twice daily as needed and monitor  3. Allergic rhinitis, unspecified allergic rhinitis type Stable on claritin 10mg  daily. Continue to monitor  4. Dementia with behavioral disturbance Advanced with decline anticipated. Continue exelon 13.3mg /24hr patch daily and namenda XR daily. Continue abilify 2mg  daily for mood. Continue to monitor for change in behaviors and mood. Continue fall risk and pressure ulcer precautions  5. Insomnia Stable.  Continue melatonin 3mg  daily and monitor  6. Vitamin D deficiency Continue vitD 1000iu daily and monitor  7. Subcapital fracture of left hip, sequela Stable. Continue norco 5/325mg  every four hours as needed for pain and monitor  8. Pressure ulcer stage III Resolved on 09/28/14. Continue same treatment until new epithelization thickens. Continue pressure ulcer precautions   Family/Staff Communication Plan of care discussed with nursing staff. Nursing staff verbalized understanding and agree with plan of care. No additional questions or concerns reported.    Arthur Holms, MSN, AGNP-C [facility] [address] [place], [place] [address] [phone] [8am-5pm] After hours: [phone]

## 2014-11-03 ENCOUNTER — Encounter: Payer: Self-pay | Admitting: Registered Nurse

## 2014-11-03 ENCOUNTER — Non-Acute Institutional Stay (SKILLED_NURSING_FACILITY): Payer: Medicare Other | Admitting: Registered Nurse

## 2014-11-03 DIAGNOSIS — E43 Unspecified severe protein-calorie malnutrition: Secondary | ICD-10-CM

## 2014-11-03 DIAGNOSIS — F0280 Dementia in other diseases classified elsewhere without behavioral disturbance: Secondary | ICD-10-CM

## 2014-11-03 DIAGNOSIS — F0393 Unspecified dementia, unspecified severity, with mood disturbance: Secondary | ICD-10-CM

## 2014-11-03 DIAGNOSIS — G309 Alzheimer's disease, unspecified: Secondary | ICD-10-CM

## 2014-11-03 DIAGNOSIS — F329 Major depressive disorder, single episode, unspecified: Secondary | ICD-10-CM

## 2014-11-03 DIAGNOSIS — Z8781 Personal history of (healed) traumatic fracture: Secondary | ICD-10-CM

## 2014-11-03 DIAGNOSIS — F29 Unspecified psychosis not due to a substance or known physiological condition: Secondary | ICD-10-CM

## 2014-11-03 DIAGNOSIS — K5900 Constipation, unspecified: Secondary | ICD-10-CM

## 2014-11-03 DIAGNOSIS — J309 Allergic rhinitis, unspecified: Secondary | ICD-10-CM

## 2014-11-03 DIAGNOSIS — G4700 Insomnia, unspecified: Secondary | ICD-10-CM

## 2014-11-03 DIAGNOSIS — F0150 Vascular dementia without behavioral disturbance: Secondary | ICD-10-CM

## 2014-11-03 NOTE — Progress Notes (Signed)
Patient ID: [name], male   DOB: 1931-12-05, 81 y.o.   MRN: [id number]   Place of Service: [facility]  Allergies  Allergen Reactions  . Codeine     REACTION: Rash,itching    Code Status: Full Code  Goals of Care: Longevity/LTC  Chief Complaint  Patient presents with  . Medical Management of Chronic Issues    dementia, depression, psychosis, insomnia, AR, constipation     HPI 81 y.o. 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 Has 3lb weight loss since last routine visit. No recent fall or skin concerns reported. No change in behaviors or functional status reported. No concerns from staff. Seen in room today. Unable to participate in HPI and ROS but appears comfortable. He continues to need total assistance with ADLs. Constipation is stable with colace. Depression and psychosis is improved with zoloft, remeron, and alibify. Dementia is still progressing-has been off of all his dementia meds for 2-3 weeks. AR is stable with claritin.   Review of Systems Unable to obtain due to dementia  Past Medical History  Diagnosis Date  . Anxiety state, unspecified   . Arthritis     fingers/shoulders  . Unspecified cataract   . Colon polyps     last colonoscopy 2010  . CAD (coronary artery disease) 2006    s/p CABG  . Senile dementia, uncomplicated   . Depression   . HLD (hyperlipidemia)   . Gastric ulcer, unspecified as acute or chronic, without mention of hemorrhage, perforation, or obstruction   . Hypertension   . Insomnia   . TIA (transient ischemic attack)   . Clostridium difficile infection 2012    recurrent-last treatment 10/2010  . BPH (benign prostatic hypertrophy)   . H/O: hypothyroidism   . History of chronic pancreatitis   . Alcohol abuse, in  remission     sober x years  . Paroxysmal a-fib     history of  . Hx: UTI (urinary tract infection)   . Diabetes mellitus type II     history of  . ARF (acute renal failure)     history of  . History of chicken pox   . Pulmonary nodule/lesion, solitary 05/2010    54mm L lung base nodule, if high risk rec f/u with CT 1 yr, if low risk, no f/u needed  . Muscle weakness (generalized)   . Lack of coordination   . Psychosis   . Episodic mood disorder   . Extrapyramidal disorder   . Altered mental status   . ALCOHOL ABUSE, HX OF 12/24/2006    Qualifier: Diagnosis of  By: [name]  MD, [name]    . COLON POLYP 12/24/2006    Qualifier: Diagnosis of  By: [name]    . Oral lesion 02/27/2011  . Subdural hematoma, post-traumatic 08/04/2013  . Protein-calorie malnutrition, severe 08/05/2013    Past Surgical History  Procedure Laterality Date  . Coronary artery bypass graft  2006  . Injection knee  09/06/2001  . Cardiovascular stress test  05/2008    small regions of perfusion abnormality suggestive of diaphragm artifact, exercise cap 7 METs  . Hip arthroplasty Left 07/30/2014    Procedure: ARTHROPLASTY BIPOLAR HIP;  Surgeon: Johnn Hai, MD;  Location: [facility];  Service: Orthopedics;  Laterality: Left;  History   Social History  . Marital Status: Widowed    Spouse Name: N/A    Number of Children: N/A  . Years of Education: N/A   Occupational History  . Retired Administrator, disability for back pain    Social History Main Topics  . Smoking status: Former Smoker    Quit date: 10/27/2005  . Smokeless tobacco: Not on file  . Alcohol Use: No     Comment: Alcoholism-Sober-Quit x 3 years  . Drug Use: No     Comment: MJ-Quit x3 years  . Sexual Activity: No   Other Topics Concern  . Not on file   Social History Narrative   Lives with daughter and her fiance      8th grade education    Family History  Problem Relation Age of Onset  . Dementia Father     ?  Alzheimers  . Heart attack Father   . Hypertension Brother   . Diabetes Brother       Medication List       This list is accurate as of: 11/03/14  5:49 PM.  Always use your most recent med list.               ARIPiprazole 2 MG tablet  Commonly known as:  ABILIFY  Take 2 mg by mouth daily.     CERTAGEN PO  Take 1 tablet by mouth daily.     cholecalciferol 1000 UNITS tablet  Commonly known as:  VITAMIN D  Take 1,000 Units by mouth daily.     docusate sodium 100 MG capsule  Commonly known as:  COLACE  Take 1 capsule (100 mg total) by mouth 2 (two) times daily as needed for mild constipation.     HYDROcodone-acetaminophen 5-325 MG per tablet  Commonly known as:  NORCO/VICODIN  Take one tablet by mouth every 4 hours as needed for pain     loratadine 10 MG tablet  Commonly known as:  CLARITIN  Take 10 mg by mouth daily.     Melatonin 3 MG Tabs  Take 3 mg by mouth at bedtime.     mirtazapine 7.5 MG tablet  Commonly known as:  REMERON  Take 7.5 mg by mouth at bedtime.     sertraline 25 MG tablet  Commonly known as:  ZOLOFT  Take 100 mg by mouth daily.        Physical Exam  BP 130/76 mmHg  Pulse 66  Temp(Src) 97.1 F (36.2 C)  Resp 18  Ht 6\' 1"  (1.854 m)  Wt 140 lb 12.8 oz (63.866 kg)  BMI 18.58 kg/m2  Constitutional: thin/frail elderly male in no acute distress.  HEENT: Normocephalic and atraumatic. PERRL. EOM intact. No icterus. Oral mucosa moist.  Neck: Supple and nontender. No lymphadenopathy, masses, or thyromegaly. No JVD or carotid bruits. Cardiac: Normal S1, S2. RRR without appreciable murmurs, rubs, or gallops. Distal pulses intact. No dependent edema.  Lungs: No respiratory distress. Breath sounds clear bilaterally without rales, rhonchi, or wheezes. Abdomen: Audible bowel sounds in all quadrants. Soft, nontender, nondistended.  Musculoskeletal: able to move all extremities. No joint erythema or tenderness. Skin: Warm and dry. No rash noted. No  erythema.  Neurological: Alert with confusions Psychiatric: calm today.   Labs Reviewed  CBC Latest Ref Rng 08/29/2014 08/01/2014 07/31/2014  WBC - 9.4 9.1 10.0  Hemoglobin 13.5 - 17.5 g/dL 13.6 9.9(L) 10.5(L)  Hematocrit 41 - 53 % 43 29.8(L) 32.0(L)  Platelets 150 - 399 K/L  171 92(L) 88(L)    CMP Latest Ref Rng 08/29/2014 08/01/2014 07/31/2014  Glucose 70 - 99 mg/dL - 147(H) 182(H)  BUN 4 - 21 mg/dL 17 22 25(H)  Creatinine 0.6 - 1.3 mg/dL 0.9 1.14 1.15  Sodium 137 - 147 mmol/L 138 140 137  Potassium 3.4 - 5.3 mmol/L 4.2 4.2 4.2  Chloride 96 - 112 mEq/L - 106 104  CO2 19 - 32 mEq/L - 22 22  Calcium 8.4 - 10.5 mg/dL - 7.9(L) 7.7(L)  Total Protein 6.0 - 8.3 g/dL - - -  Albumin - - - -  Total Bilirubin 0.3 - 1.2 mg/dL - - -  Alkaline Phos 25 - 125 U/L 201(A) - -  AST 14 - 40 U/L 16 - -  ALT 10 - 40 U/L 10 - -    Lab Results  Component Value Date   HGBA1C 5.8 09/06/2014    Lipid Panel     Component Value Date/Time   CHOL 131 11/15/2013   TRIG 97 11/15/2013   HDL 45 11/15/2013   CHOLHDL 2.6 11/13/2009 0500   VLDL 16 11/13/2009 0500   LDLCALC 67 11/15/2013   LDLDIRECT 75.4 01/27/2011 1122   Assessment & Plan 1. Depression due to dementia Stable. Continue zoloft 100mg  daily and remeron 7.5mg  nightly. Continue to monitor for change in mood.   2. Psychosis, unspecified psychosis type Stable. Continue abilify 2mg  daily. Continue to monitor. Fall risk precautions  3. Mixed Alzheimer's and vascular dementia Advanced with further declined anticipated. Exelon patch and Namenda were discontinued about 2-3 weeks ago without any issues. Continue to monitor for change in behaviors. Fall and pressure ulcer precautions   4. Allergic rhinitis, unspecified allergic rhinitis type Stable. Continue claritin 10mg  daily and monitor.   5. Protein-calorie malnutrition, severe Last Total protein on 08/29/14 was 6.1 and albumin 4.0. Has 3lb weight loss since last routine visit. Continue  current diet with dietary supplements. Remeron will also help with appetite. Continue to monitor.   6. Insomnia Stable. Continue melatonin 3mg  nightly and monitor.   7. Constipation, unspecified constipation type Stable. Continue colace 100mg  twice daily as needed and monitor. Encourage hydration and OOB to geri chair as tolerated.   8. Hx of Left Hip fracture S/p left hip arthroplasty. Continue norco 5/325mg  every four hours as needed for pain. Continue to monitor   Family/Staff Communication Plan of care discussed with nursing staff. Nursing staff verbalized understanding and agree with plan of care. No additional questions or concerns reported.    Arthur Holms, MSN, AGNP-C [facility] [address] [place], [place] [address] [phone] [8am-5pm] After hours: [phone]

## 2014-12-04 ENCOUNTER — Encounter: Payer: Self-pay | Admitting: Registered Nurse

## 2014-12-04 ENCOUNTER — Non-Acute Institutional Stay (SKILLED_NURSING_FACILITY): Payer: Medicare Other | Admitting: Registered Nurse

## 2014-12-04 DIAGNOSIS — E43 Unspecified severe protein-calorie malnutrition: Secondary | ICD-10-CM

## 2014-12-04 DIAGNOSIS — F0280 Dementia in other diseases classified elsewhere without behavioral disturbance: Secondary | ICD-10-CM | POA: Diagnosis not present

## 2014-12-04 DIAGNOSIS — F329 Major depressive disorder, single episode, unspecified: Secondary | ICD-10-CM | POA: Diagnosis not present

## 2014-12-04 DIAGNOSIS — F29 Unspecified psychosis not due to a substance or known physiological condition: Secondary | ICD-10-CM | POA: Diagnosis not present

## 2014-12-04 DIAGNOSIS — F0393 Unspecified dementia, unspecified severity, with mood disturbance: Secondary | ICD-10-CM

## 2014-12-04 DIAGNOSIS — K5900 Constipation, unspecified: Secondary | ICD-10-CM | POA: Diagnosis not present

## 2014-12-04 DIAGNOSIS — F0150 Vascular dementia without behavioral disturbance: Secondary | ICD-10-CM | POA: Diagnosis not present

## 2014-12-04 DIAGNOSIS — G309 Alzheimer's disease, unspecified: Secondary | ICD-10-CM

## 2014-12-04 DIAGNOSIS — J309 Allergic rhinitis, unspecified: Secondary | ICD-10-CM | POA: Diagnosis not present

## 2014-12-04 DIAGNOSIS — G4700 Insomnia, unspecified: Secondary | ICD-10-CM

## 2014-12-04 NOTE — Progress Notes (Addendum)
Patient ID: [name], male   DOB: 03-09-1932, 81 y.o.   MRN: [id number]   Place of Service: [facility]  Allergies  Allergen Reactions  . Codeine     REACTION: Rash,itching    Code Status: Full Code  Goals of Care: Longevity/LTC  Chief Complaint  Patient presents with  . Annual Exam  . Medical Management of Chronic Issues    dementia, depression, psychosis, AR, severe protein calorie malnutrition, insomnia, constipation    HPI  81 y.o. male with PMHof mixed AD & vasular dementia with behavioral disturbance, allergic rhinitis, HTN, constipation, insomnia, subcapital left hip fracture s/p hip arthroplasty among others is being seen for a routine visit for management of his chronic issues. Continues to have gradual weight loss: lost 2 lbs since last routine visit. No recent fall or skin concerns reported. No change in behaviors or functional status reported. No concerns from staff. Seen in room today. Unable to participate in HPI and ROS but appears comfortable. Constipation is stable with prn colace. Depression and psychosis is stable on zoloft, remeron, and alibify. Dementia is advanced-off of all dementia meds. AR is stable with claritin. Insomnia stable on melatonin.  Review of Systems Unable to obtained due to dementia  Past Medical History  Diagnosis Date  . Anxiety state, unspecified   . Arthritis     fingers/shoulders  . Unspecified cataract   . Colon polyps     last colonoscopy 2010  . CAD (coronary artery disease) 2006    s/p CABG  . Senile dementia, uncomplicated   . Depression   . HLD (hyperlipidemia)   . Gastric ulcer, unspecified as acute or chronic, without mention of hemorrhage, perforation, or obstruction   . Hypertension   . Insomnia   . TIA (transient ischemic attack)   . Clostridium difficile infection 2012    recurrent-last treatment 10/2010  . BPH (benign prostatic hypertrophy)   . H/O: hypothyroidism   . History of chronic  pancreatitis   . Alcohol abuse, in remission     sober x years  . Paroxysmal a-fib     history of  . Hx: UTI (urinary tract infection)   . Diabetes mellitus type II     history of  . ARF (acute renal failure)     history of  . History of chicken pox   . Pulmonary nodule/lesion, solitary 05/2010    68mm L lung base nodule, if high risk rec f/u with CT 1 yr, if low risk, no f/u needed  . Muscle weakness (generalized)   . Lack of coordination   . Psychosis   . Episodic mood disorder   . Extrapyramidal disorder   . Altered mental status   . ALCOHOL ABUSE, HX OF 12/24/2006    Qualifier: Diagnosis of  By: [name]  MD, [name]    . COLON POLYP 12/24/2006    Qualifier: Diagnosis of  By: [name]    . Oral lesion 02/27/2011  . Subdural hematoma, post-traumatic 08/04/2013  . Protein-calorie malnutrition, severe 08/05/2013    Past Surgical History  Procedure Laterality Date  . Coronary artery bypass graft  2006  . Injection knee  09/06/2001  . Cardiovascular stress test  05/2008    small regions of perfusion abnormality suggestive of diaphragm artifact, exercise cap 7 METs  . Hip arthroplasty Left 07/30/2014    Procedure: ARTHROPLASTY BIPOLAR HIP;  Surgeon: Johnn Hai, MD;  Location: [place];  Service: Orthopedics;  Laterality: Left;  History   Social History  . Marital Status: Widowed    Spouse Name: N/A    Number of Children: N/A  . Years of Education: N/A   Occupational History  . Retired Administrator, disability for back pain    Social History Main Topics  . Smoking status: Former Smoker    Quit date: 10/27/2005  . Smokeless tobacco: Not on file  . Alcohol Use: No     Comment: Alcoholism-Sober-Quit x 3 years  . Drug Use: No     Comment: MJ-Quit x3 years  . Sexual Activity: No   Other Topics Concern  . Not on file   Social History Narrative   Lives with daughter and her fiance      8th grade education    Family History  Problem Relation Age of Onset    . Dementia Father     ? Alzheimers  . Heart attack Father   . Hypertension Brother   . Diabetes Brother       Medication List       This list is accurate as of: 12/04/14  8:28 PM.  Always use your most recent med list.               acetaminophen 325 MG tablet  Commonly known as:  TYLENOL  Take 650 mg by mouth every 6 (six) hours as needed for mild pain or fever.     ARIPiprazole 2 MG tablet  Commonly known as:  ABILIFY  Take 2 mg by mouth daily.     CERTAGEN PO  Take 1 tablet by mouth daily.     cholecalciferol 1000 UNITS tablet  Commonly known as:  VITAMIN D  Take 1,000 Units by mouth daily.     docusate sodium 100 MG capsule  Commonly known as:  COLACE  Take 1 capsule (100 mg total) by mouth 2 (two) times daily as needed for mild constipation.     HYDROcodone-acetaminophen 5-325 MG per tablet  Commonly known as:  NORCO/VICODIN  Take one tablet by mouth every 4 hours as needed for pain     loratadine 10 MG tablet  Commonly known as:  CLARITIN  Take 10 mg by mouth daily.     Melatonin 3 MG Tabs  Take 3 mg by mouth at bedtime.     mirtazapine 7.5 MG tablet  Commonly known as:  REMERON  Take 7.5 mg by mouth at bedtime.     sertraline 25 MG tablet  Commonly known as:  ZOLOFT  Take 100 mg by mouth daily.        Physical Exam  BP 104/60 mmHg  Pulse 64  Temp(Src) 97.5 F (36.4 C)  Resp 20  Ht 6\' 1"  (1.854 m)  Wt 138 lb (62.596 kg)  BMI 18.21 kg/m2  SpO2 95%  Constitutional: thin/frail elderly male in no acute distress.  HEENT: Normocephalic and atraumatic. PERRL. EOM intact. No icterus. Oral mucosa moist.  Neck: Supple and nontender. No lymphadenopathy, masses, or thyromegaly. No JVD or carotid bruits. Cardiac: Normal S1, S2. RRR without appreciable murmurs, rubs, or gallops. Distal pulses intact. No dependent edema.  Lungs: No respiratory distress. Breath sounds clear bilaterally without rales, rhonchi, or wheezes. Abdomen: Audible bowel sounds  in all quadrants. Soft, nontender, nondistended.  Musculoskeletal: able to move all extremities. No joint erythema or tenderness. Skin: Warm and dry. No erythema.  Neurological: Alert with confusions Psychiatric: calm today.   Labs Reviewed  CBC Latest Ref Rng 08/29/2014 08/01/2014 07/31/2014  WBC - 9.4 9.1 10.0  Hemoglobin 13.5 - 17.5 g/dL 13.6 9.9(L) 10.5(L)  Hematocrit 41 - 53 % 43 29.8(L) 32.0(L)  Platelets 150 - 399 K/L 171 92(L) 88(L)    CMP Latest Ref Rng 08/29/2014 08/01/2014 07/31/2014  Glucose 70 - 99 mg/dL - 147(H) 182(H)  BUN 4 - 21 mg/dL 17 22 25(H)  Creatinine 0.6 - 1.3 mg/dL 0.9 1.14 1.15  Sodium 137 - 147 mmol/L 138 140 137  Potassium 3.4 - 5.3 mmol/L 4.2 4.2 4.2  Chloride 96 - 112 mEq/L - 106 104  CO2 19 - 32 mEq/L - 22 22  Calcium 8.4 - 10.5 mg/dL - 7.9(L) 7.7(L)  Total Protein 6.0 - 8.3 g/dL - - -  Albumin - - - -  Total Bilirubin 0.3 - 1.2 mg/dL - - -  Alkaline Phos 25 - 125 U/L 201(A) - -  AST 14 - 40 U/L 16 - -  ALT 10 - 40 U/L 10 - -    Lipid Panel     Component Value Date/Time   CHOL 131 11/15/2013   TRIG 97 11/15/2013   HDL 45 11/15/2013   CHOLHDL 2.6 11/13/2009 0500   VLDL 16 11/13/2009 0500   LDLCALC 67 11/15/2013   LDLDIRECT 75.4 01/27/2011 1122    Lab Results  Component Value Date   HGBA1C 5.8 09/06/2014    Lab Results  Component Value Date   TSH 0.60 01/27/2011    Assessment & Plan   1. Mixed Alzheimer's and vascular dementia Advanced. Further declined anticipated. Continue total assist with ADL. Continue fall risk and pressure ulcer precautions. Continue to monitor for change in behavior  2. Depression due to dementia Stable. Continue zoloft 100mg  daily, remeron 7.5mg  daily at bedtime, and monitor for change in mood  3. Psychosis, unspecified psychosis type Stable. Is followed by psy. Continue aripiprazole 2mg  daily and monitor for change in mood/behavior  4. Allergic rhinitis, unspecified allergic rhinitis type Stable.  Continue claritin 10mg  daily and monitor.   5. Protein-calorie malnutrition, severe Continues to have gradual decline in weight. Further weight loss is anticipated with advanced dementia. Continue mechanical soft diet with fortified foods to all meals and dietary supplement for nutritional support. Continue to monitor his status  6. Insomnia Stable. Continue melatonin 3mg  daily at bedtime and monitor.   7. Constipation, unspecified constipation type No issues. Continue colace 100mg  twice daily as needed for constipation. Nursing staff to offer fluid frequently and gets resident OOB to [facility] daily.   Labs ordered: Tsh  Family/Staff Communication Plan of care discussed nursing staff. Nursing staff verbalized understanding and agree with plan of care. No additional questions or concerns reported.    Arthur Holms, MSN, AGNP-C [facility] [address] [place], [place] [address] [phone] [8am-5pm]  After hours: [phone]

## 2014-12-06 LAB — TSH: TSH: 2.04 u[IU]/mL (ref 0.41–5.90)

## 2015-01-02 ENCOUNTER — Encounter: Payer: Self-pay | Admitting: Registered Nurse

## 2015-01-02 ENCOUNTER — Non-Acute Institutional Stay (SKILLED_NURSING_FACILITY): Payer: Medicare Other | Admitting: Registered Nurse

## 2015-01-02 DIAGNOSIS — E43 Unspecified severe protein-calorie malnutrition: Secondary | ICD-10-CM | POA: Diagnosis not present

## 2015-01-02 DIAGNOSIS — Z7189 Other specified counseling: Secondary | ICD-10-CM

## 2015-01-02 DIAGNOSIS — R21 Rash and other nonspecific skin eruption: Secondary | ICD-10-CM

## 2015-01-02 DIAGNOSIS — F0393 Unspecified dementia, unspecified severity, with mood disturbance: Secondary | ICD-10-CM

## 2015-01-02 DIAGNOSIS — F0280 Dementia in other diseases classified elsewhere without behavioral disturbance: Secondary | ICD-10-CM | POA: Diagnosis not present

## 2015-01-02 DIAGNOSIS — G309 Alzheimer's disease, unspecified: Secondary | ICD-10-CM

## 2015-01-02 DIAGNOSIS — K5900 Constipation, unspecified: Secondary | ICD-10-CM

## 2015-01-02 DIAGNOSIS — G4700 Insomnia, unspecified: Secondary | ICD-10-CM

## 2015-01-02 DIAGNOSIS — F29 Unspecified psychosis not due to a substance or known physiological condition: Secondary | ICD-10-CM | POA: Diagnosis not present

## 2015-01-02 DIAGNOSIS — F329 Major depressive disorder, single episode, unspecified: Secondary | ICD-10-CM

## 2015-01-02 DIAGNOSIS — J309 Allergic rhinitis, unspecified: Secondary | ICD-10-CM

## 2015-01-02 DIAGNOSIS — F0150 Vascular dementia without behavioral disturbance: Secondary | ICD-10-CM | POA: Diagnosis not present

## 2015-01-02 NOTE — Progress Notes (Signed)
Patient ID: [name], male   DOB: 08-25-1932, 81 y.o.   MRN: [id number]  Place of Service: [facility]  Allergies  Allergen Reactions  . Codeine     REACTION: Rash,itching    Code Status: DNR effective 01/02/15  Goals of Care: Comfort and Quality of Life/LTC  Chief Complaint  Patient presents with  . Medical Management of Chronic Issues    Dementia, depression, psychosis, AR, constipation, protein calorie malnutrition     HPI  81 y.o. male with PMHof mixed AD & vasular dementia with behavioral disturbance, allergic rhinitis, HTN, constipation, insomnia, left hip fracture among others is being seen for a routine visit for management of his chronic issues. Weight stable over the past 30 days. No recent fall or skin concern reported. No change in behaviors or functional status reported. No concerns from staff. Seen in geri chair today. Unable to participate in HPI and ROS but appears in no distress. No issues with constipation. Mood stable on zoloft, remeron, and alibify. Dementia is advanced-off of all dementia meds. Insomnia stable on melatonin. AR is stable with claritin.   Review of Systems Unable to obtained due to dementia  Past Medical History  Diagnosis Date  . Anxiety state, unspecified   . Arthritis     fingers/shoulders  . Unspecified cataract   . Colon polyps     last colonoscopy 2010  . CAD (coronary artery disease) 2006    s/p CABG  . Senile dementia, uncomplicated   . Depression   . HLD (hyperlipidemia)   . Gastric ulcer, unspecified as acute or chronic, without mention of hemorrhage, perforation, or obstruction   . Hypertension   . Insomnia   . TIA (transient ischemic attack)   . Clostridium difficile infection 2012    recurrent-last treatment 10/2010  . BPH (benign prostatic hypertrophy)   . H/O: hypothyroidism   . History of chronic pancreatitis   . Alcohol abuse, in remission     sober x years  . Paroxysmal a-fib     history of  . Hx:  UTI (urinary tract infection)   . Diabetes mellitus type II     history of  . ARF (acute renal failure)     history of  . History of chicken pox   . Pulmonary nodule/lesion, solitary 05/2010    59mm L lung base nodule, if high risk rec f/u with CT 1 yr, if low risk, no f/u needed  . Muscle weakness (generalized)   . Lack of coordination   . Psychosis   . Episodic mood disorder   . Extrapyramidal disorder   . Altered mental status   . ALCOHOL ABUSE, HX OF 12/24/2006    Qualifier: Diagnosis of  By: [name]  MD, [name]    . COLON POLYP 12/24/2006    Qualifier: Diagnosis of  By: [name]    . Oral lesion 02/27/2011  . Subdural hematoma, post-traumatic 08/04/2013  . Protein-calorie malnutrition, severe 08/05/2013    Past Surgical History  Procedure Laterality Date  . Coronary artery bypass graft  2006  . Injection knee  09/06/2001  . Cardiovascular stress test  05/2008    small regions of perfusion abnormality suggestive of diaphragm artifact, exercise cap 7 METs  . Hip arthroplasty Left 07/30/2014    Procedure: ARTHROPLASTY BIPOLAR HIP;  Surgeon: Johnn Hai, MD;  Location: [place];  Service: Orthopedics;  Laterality: Left;    History   Social History  . Marital Status: Widowed  Spouse Name: N/A  . Number of Children: N/A  . Years of Education: N/A   Occupational History  . Retired Administrator, disability for back pain    Social History Main Topics  . Smoking status: Former Smoker    Quit date: 10/27/2005  . Smokeless tobacco: Not on file  . Alcohol Use: No     Comment: Alcoholism-Sober-Quit x 3 years  . Drug Use: No     Comment: MJ-Quit x3 years  . Sexual Activity: No   Other Topics Concern  . Not on file   Social History Narrative   Lives with daughter and her fiance      8th grade education    Family History  Problem Relation Age of Onset  . Dementia Father     ? Alzheimers  . Heart attack Father   . Hypertension Brother   . Diabetes Brother        Medication List       This list is accurate as of: 01/02/15  8:31 PM.  Always use your most recent med list.               acetaminophen 325 MG tablet  Commonly known as:  TYLENOL  Take 650 mg by mouth every 6 (six) hours as needed for mild pain or fever.     ARIPiprazole 2 MG tablet  Commonly known as:  ABILIFY  Take 2 mg by mouth daily.     CERTAGEN PO  Take 1 tablet by mouth daily.     cholecalciferol 1000 UNITS tablet  Commonly known as:  VITAMIN D  Take 1,000 Units by mouth daily.     docusate sodium 100 MG capsule  Commonly known as:  COLACE  Take 1 capsule (100 mg total) by mouth 2 (two) times daily as needed for mild constipation.     HYDROcodone-acetaminophen 5-325 MG per tablet  Commonly known as:  NORCO/VICODIN  Take one tablet by mouth every 4 hours as needed for pain     loratadine 10 MG tablet  Commonly known as:  CLARITIN  Take 10 mg by mouth daily.     Melatonin 3 MG Tabs  Take 3 mg by mouth at bedtime.     mirtazapine 7.5 MG tablet  Commonly known as:  REMERON  Take 7.5 mg by mouth at bedtime.     sertraline 25 MG tablet  Commonly known as:  ZOLOFT  Take 100 mg by mouth daily.        Physical Exam  BP 140/76 mmHg  Pulse 62  Temp(Src) 97.8 F (36.6 C)  Resp 18  Ht 6\' 1"  (1.854 m)  Wt 139 lb 3.2 oz (63.141 kg)  BMI 18.37 kg/m2  Constitutional: thin/frail elderly male in no acute distress. Garbled speech. HEENT: Normocephalic and atraumatic. PERRL. No icterus. Oral mucosa moist.  Neck: Supple and nontender. No lymphadenopathy, masses, or thyromegaly. No JVD or carotid bruits. Cardiac: Normal S1, S2. RRR without appreciable murmurs, rubs, or gallops. Distal pulses intact. No dependent edema.  Lungs: No respiratory distress. Breath sounds clear bilaterally without rales, rhonchi, or wheezes. Abdomen: Audible bowel sounds in all quadrants. Soft, nontender, nondistended.  Musculoskeletal: able to move all extremities. No joint  erythema or tenderness. Skin: Warm and dry. Multiple flat red lesions noted on BLE: some with scabs.  Neurological: Alert with confusions Psychiatric: Calm, but becomes restless when being disturbed   Labs Reviewed  CBC Latest Ref Rng 08/29/2014 08/01/2014 07/31/2014  WBC - 9.4  9.1 10.0  Hemoglobin 13.5 - 17.5 g/dL 13.6 9.9(L) 10.5(L)  Hematocrit 41 - 53 % 43 29.8(L) 32.0(L)  Platelets 150 - 399 K/L 171 92(L) 88(L)    CMP Latest Ref Rng 08/29/2014 08/01/2014 07/31/2014  Glucose 70 - 99 mg/dL - 147(H) 182(H)  BUN 4 - 21 mg/dL 17 22 25(H)  Creatinine 0.6 - 1.3 mg/dL 0.9 1.14 1.15  Sodium 137 - 147 mmol/L 138 140 137  Potassium 3.4 - 5.3 mmol/L 4.2 4.2 4.2  Chloride 96 - 112 mEq/L - 106 104  CO2 19 - 32 mEq/L - 22 22  Calcium 8.4 - 10.5 mg/dL - 7.9(L) 7.7(L)  Total Protein 6.0 - 8.3 g/dL - - -  Albumin - - - -  Total Bilirubin 0.3 - 1.2 mg/dL - - -  Alkaline Phos 25 - 125 U/L 201(A) - -  AST 14 - 40 U/L 16 - -  ALT 10 - 40 U/L 10 - -    Lipid Panel     Component Value Date/Time   CHOL 131 11/15/2013   TRIG 97 11/15/2013   HDL 45 11/15/2013   CHOLHDL 2.6 11/13/2009 0500   VLDL 16 11/13/2009 0500   LDLCALC 67 11/15/2013   LDLDIRECT 75.4 01/27/2011 1122    Lab Results  Component Value Date   HGBA1C 5.8 09/06/2014    Lab Results  Component Value Date   TSH 2.04 12/06/2014    Assessment & Plan 1. Goals of care discussion/counseling Poor prognosis with severe dementia and severe protein calorie malnutrition. Updated his status with stepdaughter, Adair Laundry. We discussed in details about his prognosis and what a full code status means. After our discussion, family decided to change his code status to DNR.   2. Mixed Alzheimer's and vascular dementia Advanced. Further declined anticipated. Continue total assist with ADL. Continue fall risk and pressure ulcer precautions. Continue to monitor for change in behavior  3. Depression due to dementia Mood stable. Continue  zoloft 100mg  daily and remeron 7.5mg  daily at bedtime. Monitor for change in mood  4. Psychosis, unspecified psychosis type Stable. Is followed by psy. Continue abilify 2mg  daily and monitor for change in mood/behavior  5. Allergic rhinitis, unspecified allergic rhinitis type Stable. Continue claritin 10mg  daily and monitor.   6. Protein-calorie malnutrition, severe Weight stable over the past 30 days. Further weight loss is anticipated with advanced dementia. Continue mechanical soft diet with fortified foods to all meals and dietary supplement for nutritional support. Continue to monitor weight and his status.   7. Insomnia Stable. Continue melatonin 3mg  daily at bedtime and monitor.   8. Constipation, unspecified constipation type Stable. Continue colace 100mg  twice daily as needed for constipation. Continue to offer resident with fluid frequently and gets resident OOB to [employer] daily.   9. Rash of BLE Unknown cause. Mostly likely secondary to rubbing shoes with buckles over shins. Start triamcinolone:cetaphil (1:1) topically to BLE twice daily until healing complete. No shoes, only use nonskid socks while in geri chair. Reassess and continue to monitor.   Family/Staff Communication Plan of care discussed with stepdaughter, Enid Derry and Engineer, civil (consulting). Enid Derry and nursing staff verbalized understanding and agree with plan of care. No additional questions or concerns reported.    Arthur Holms, MSN, AGNP-C [facility] [address] [place], [place] [address] [phone] [8am-5pm]  After hours: [phone]

## 2015-02-02 ENCOUNTER — Encounter: Payer: Self-pay | Admitting: Registered Nurse

## 2015-02-02 ENCOUNTER — Non-Acute Institutional Stay (SKILLED_NURSING_FACILITY): Payer: Medicare Other | Admitting: Registered Nurse

## 2015-02-02 DIAGNOSIS — J309 Allergic rhinitis, unspecified: Secondary | ICD-10-CM

## 2015-02-02 DIAGNOSIS — K5900 Constipation, unspecified: Secondary | ICD-10-CM | POA: Diagnosis not present

## 2015-02-02 DIAGNOSIS — E43 Unspecified severe protein-calorie malnutrition: Secondary | ICD-10-CM | POA: Diagnosis not present

## 2015-02-02 DIAGNOSIS — G309 Alzheimer's disease, unspecified: Secondary | ICD-10-CM | POA: Diagnosis not present

## 2015-02-02 DIAGNOSIS — L309 Dermatitis, unspecified: Secondary | ICD-10-CM

## 2015-02-02 DIAGNOSIS — G4700 Insomnia, unspecified: Secondary | ICD-10-CM | POA: Diagnosis not present

## 2015-02-02 DIAGNOSIS — F0280 Dementia in other diseases classified elsewhere without behavioral disturbance: Secondary | ICD-10-CM

## 2015-02-02 DIAGNOSIS — F0150 Vascular dementia without behavioral disturbance: Secondary | ICD-10-CM

## 2015-02-02 DIAGNOSIS — F329 Major depressive disorder, single episode, unspecified: Secondary | ICD-10-CM

## 2015-02-02 DIAGNOSIS — F29 Unspecified psychosis not due to a substance or known physiological condition: Secondary | ICD-10-CM | POA: Diagnosis not present

## 2015-02-02 DIAGNOSIS — F0393 Unspecified dementia, unspecified severity, with mood disturbance: Secondary | ICD-10-CM

## 2015-02-02 NOTE — Progress Notes (Signed)
Patient ID: [name], male   DOB: [birth year], 81 y.o.   MRN: [id number]   Place of Service: [facility]  Allergies  Allergen Reactions  . Codeine     REACTION: Rash,itching    Code Status: DNR   Goals of Care: Comfort and Quality of Life/LTC  Chief Complaint  Patient presents with  . Medical Management of Chronic Issues    dementia, depression, psychosis, AR, PCM, insomnia, constipation, dermatitis of BLE    HPI  81 y.o. male with PMHof mixed AD & vasular dementia with behavioral disturbance, allergic rhinitis, HTN, constipation, insomnia, left hip fracture among others is being seen for a routine visit for management of his chronic issues. Weight continues to decline-lost 4 lbs over the past 30 days. No recent fall or skin concern reported. No change in behaviors or functional status reported. No concerns from staff. Seen in geri chair today. Unable to participate in HPI and ROS but appears in no distress. Dementia is advanced.  Mood stable on zoloft, remeron, and alibify.  Insomnia stable on melatonin. AR is stable with claritin. No issues with constipation. BLE dermatitis stable.   Review of Systems Unable to obtained due to dementia  Past Medical History  Diagnosis Date  . Anxiety state, unspecified   . Arthritis     fingers/shoulders  . Unspecified cataract   . Colon polyps     last colonoscopy 2010  . CAD (coronary artery disease) 2006    s/p CABG  . Senile dementia, uncomplicated   . Depression   . HLD (hyperlipidemia)   . Gastric ulcer, unspecified as acute or chronic, without mention of hemorrhage, perforation, or obstruction   . Hypertension   . Insomnia   . TIA (transient ischemic attack)   . Clostridium difficile infection 2012    recurrent-last treatment 10/2010  . BPH (benign prostatic hypertrophy)   . H/O: hypothyroidism   . History of chronic pancreatitis   . Alcohol abuse, in remission     sober x years  . Paroxysmal a-fib    history of  . Hx: UTI (urinary tract infection)   . Diabetes mellitus type II     history of  . ARF (acute renal failure)     history of  . History of chicken pox   . Pulmonary nodule/lesion, solitary 05/2010    18mm L lung base nodule, if high risk rec f/u with CT 1 yr, if low risk, no f/u needed  . Muscle weakness (generalized)   . Lack of coordination   . Psychosis   . Episodic mood disorder   . Extrapyramidal disorder   . Altered mental status   . ALCOHOL ABUSE, HX OF 12/24/2006    Qualifier: Diagnosis of  By: [name]  MD, [name]    . COLON POLYP 12/24/2006    Qualifier: Diagnosis of  By: [name]    . Oral lesion 02/27/2011  . Subdural hematoma, post-traumatic 08/04/2013  . Protein-calorie malnutrition, severe 08/05/2013    Past Surgical History  Procedure Laterality Date  . Coronary artery bypass graft  2006  . Injection knee  09/06/2001  . Cardiovascular stress test  05/2008    small regions of perfusion abnormality suggestive of diaphragm artifact, exercise cap 7 METs  . Hip arthroplasty Left 07/30/2014    Procedure: ARTHROPLASTY BIPOLAR HIP;  Surgeon: Johnn Hai, MD;  Location: [place];  Service: Orthopedics;  Laterality: Left;    History   Social History  .  Marital Status: Widowed    Spouse Name: N/A  . Number of Children: N/A  . Years of Education: N/A   Occupational History  . Retired Administrator, disability for back pain    Social History Main Topics  . Smoking status: Former Smoker    Quit date: 10/27/2005  . Smokeless tobacco: Not on file  . Alcohol Use: No     Comment: Alcoholism-Sober-Quit x 3 years  . Drug Use: No     Comment: MJ-Quit x3 years  . Sexual Activity: No   Other Topics Concern  . Not on file   Social History Narrative   Lives with daughter and her fiance      8th grade education    Family History  Problem Relation Age of Onset  . Dementia Father     ? Alzheimers  . Heart attack Father   . Hypertension Brother     . Diabetes Brother       Medication List       This list is accurate as of: 02/02/15 12:54 PM.  Always use your most recent med list.               acetaminophen 325 MG tablet  Commonly known as:  TYLENOL  Take 650 mg by mouth every 6 (six) hours as needed for mild pain or fever.     ARIPiprazole 2 MG tablet  Commonly known as:  ABILIFY  Take 2 mg by mouth every other day.     CERTAGEN PO  Take 1 tablet by mouth daily.     cholecalciferol 1000 UNITS tablet  Commonly known as:  VITAMIN D  Take 1,000 Units by mouth daily.     docusate sodium 100 MG capsule  Commonly known as:  COLACE  Take 1 capsule (100 mg total) by mouth 2 (two) times daily as needed for mild constipation.     HYDROcodone-acetaminophen 5-325 MG per tablet  Commonly known as:  NORCO/VICODIN  Take one tablet by mouth every 4 hours as needed for pain     loratadine 10 MG tablet  Commonly known as:  CLARITIN  Take 10 mg by mouth daily.     Melatonin 3 MG Tabs  Take 3 mg by mouth at bedtime.     mirtazapine 7.5 MG tablet  Commonly known as:  REMERON  Take 7.5 mg by mouth at bedtime.     sertraline 25 MG tablet  Commonly known as:  ZOLOFT  Take 100 mg by mouth daily.        Physical Exam  BP 100/60 mmHg  Pulse 70  Temp(Src) 97.2 F (36.2 C)  Resp 16  Ht 6\' 1"  (1.854 m)  Wt 135 lb (61.236 kg)  BMI 17.82 kg/m2  SpO2 98%  Constitutional: frail elderly male in no acute distress. Garbled speech. HEENT: Normocephalic and atraumatic. PERRL. No icterus. Oral mucosa moist.  Neck: No lymphadenopathy, masses, or thyromegaly. No JVD or carotid bruits. Cardiac: Normal S1, S2. RRR without appreciable murmurs, rubs, or gallops. Distal pulses intact. No dependent edema.  Lungs: No respiratory distress. Breath sounds clear bilaterally without rales, rhonchi, or wheezes. Abdomen: Audible bowel sounds in all quadrants. Soft, nontender, nondistended.  Musculoskeletal: No joint erythema or  tenderness. Skin: Warm and dry. Multiple red/purple lesions noted on BLE. Neurological: Arouseable with confusions Psychiatric: Calm, but becomes restless when being disturbed   Labs Reviewed  CBC Latest Ref Rng 08/29/2014 08/01/2014 07/31/2014  WBC - 9.4 9.1 10.0  Hemoglobin  13.5 - 17.5 g/dL 13.6 9.9(L) 10.5(L)  Hematocrit 41 - 53 % 43 29.8(L) 32.0(L)  Platelets 150 - 399 K/L 171 92(L) 88(L)    CMP Latest Ref Rng 08/29/2014 08/01/2014 07/31/2014  Glucose 70 - 99 mg/dL - 147(H) 182(H)  BUN 4 - 21 mg/dL 17 22 25(H)  Creatinine 0.6 - 1.3 mg/dL 0.9 1.14 1.15  Sodium 137 - 147 mmol/L 138 140 137  Potassium 3.4 - 5.3 mmol/L 4.2 4.2 4.2  Chloride 96 - 112 mEq/L - 106 104  CO2 19 - 32 mEq/L - 22 22  Calcium 8.4 - 10.5 mg/dL - 7.9(L) 7.7(L)  Total Protein 6.0 - 8.3 g/dL - - -  Albumin - - - -  Total Bilirubin 0.3 - 1.2 mg/dL - - -  Alkaline Phos 25 - 125 U/L 201(A) - -  AST 14 - 40 U/L 16 - -  ALT 10 - 40 U/L 10 - -    Lipid Panel     Component Value Date/Time   CHOL 131 11/15/2013   TRIG 97 11/15/2013   HDL 45 11/15/2013   CHOLHDL 2.6 11/13/2009 0500   VLDL 16 11/13/2009 0500   LDLCALC 67 11/15/2013   LDLDIRECT 75.4 01/27/2011 1122    Lab Results  Component Value Date   HGBA1C 5.8 09/06/2014    Lab Results  Component Value Date   TSH 2.04 12/06/2014    Assessment & Plan 1. Mixed Alzheimer's and vascular dementia Advanced. Further declined anticipated. Continue total assist with ADL. Continue fall risk and pressure ulcer precautions. Continue to monitor for change in behavior  2. Depression due to dementia Mood stable. Continue zoloft 100mg  daily and remeron 7.5mg  daily at bedtime. Monitor for change in mood  3. Psychosis, unspecified psychosis type Stable. Is followed by psy. Continue abilify 2mg  every other day and monitor for change in mood/behavior  4. Allergic rhinitis, unspecified allergic rhinitis type Stable. Continue claritin 10mg  daily and monitor.   5.  Protein-calorie malnutrition, severe 4lb weight loss over the past 30 days. Further weight loss is anticipated with advanced dementia. Continue mechanical soft diet with fortified foods to all meals and dietary supplement for nutritional support. Recheck labs and continue to monitor  6. Insomnia No issues. Continue melatonin 3mg  daily and remeron 7.5mg  daily at bedtime and monitor.   7. Constipation, unspecified constipation type Stable. Continue colace 100mg  twice daily as needed for constipation. Continue to offer resident with fluid frequently and gets resident OOB to [employer] daily.   8. Dermatitis of BLE Stable. Continue triamcinolone:cetaphil (1:1) topically to BLE twice daily until healing complete. Continue to monitor   Labs ordered: cbc, cmp  Family/Staff Communication Plan of care discussed with nursing staff. Nursing staff verbalized understanding and agree with plan of care. No additional questions or concerns reported.    Arthur Holms, MSN, AGNP-C [facility] [address] [place], [place] [address] [phone] [8am-5pm]  After hours: [phone]

## 2015-02-04 NOTE — Addendum Note (Signed)
Addended by: Orion Modest on: 02/04/2015 08:58 PM   Modules accepted: Level of Service

## 2015-02-05 LAB — BASIC METABOLIC PANEL
BUN: 18 mg/dL (ref 4–21)
Creatinine: 0.9 mg/dL (ref 0.6–1.3)
Glucose: 71 mg/dL
Potassium: 4.3 mmol/L (ref 3.4–5.3)
Sodium: 143 mmol/L (ref 137–147)

## 2015-02-12 ENCOUNTER — Non-Acute Institutional Stay (SKILLED_NURSING_FACILITY): Payer: Medicare Other | Admitting: Registered Nurse

## 2015-02-12 ENCOUNTER — Encounter: Payer: Self-pay | Admitting: Registered Nurse

## 2015-02-12 DIAGNOSIS — B351 Tinea unguium: Secondary | ICD-10-CM

## 2015-02-12 DIAGNOSIS — L03031 Cellulitis of right toe: Secondary | ICD-10-CM

## 2015-02-12 NOTE — Progress Notes (Signed)
Patient ID: [name], male   DOB: 1932-04-23, 81 y.o.   MRN: [id number]   Place of Service: [facility]  Allergies  Allergen Reactions  . Codeine     REACTION: Rash,itching    Code Status: DNR   Goals of Care: Comfort and Quality of Life/LTC  Chief Complaint  Patient presents with  . Acute Visit    right great toe red and swollen    HPI  81 y.o. male with PMH of mixed AD & vasular dementia with behavioral disturbance, allergic rhinitis, HTN, constipation, insomnia, left hip fracture among others is being seen for an acute visit at the request of nursing staff for the evaluation of red and swollen right great toe. Seen in geri chair today. Unable to participate in hpi or ros.   Review of Systems Unable to obtained due to dementia  Past Medical History  Diagnosis Date  . Anxiety state, unspecified   . Arthritis     fingers/shoulders  . Unspecified cataract   . Colon polyps     last colonoscopy 2010  . CAD (coronary artery disease) 2006    s/p CABG  . Senile dementia, uncomplicated   . Depression   . HLD (hyperlipidemia)   . Gastric ulcer, unspecified as acute or chronic, without mention of hemorrhage, perforation, or obstruction   . Hypertension   . Insomnia   . TIA (transient ischemic attack)   . Clostridium difficile infection 2012    recurrent-last treatment 10/2010  . BPH (benign prostatic hypertrophy)   . H/O: hypothyroidism   . History of chronic pancreatitis   . Alcohol abuse, in remission     sober x years  . Paroxysmal a-fib     history of  . Hx: UTI (urinary tract infection)   . Diabetes mellitus type II     history of  . ARF (acute renal failure)     history of  . History of chicken pox   . Pulmonary nodule/lesion, solitary 05/2010    3mm L lung base nodule, if high risk rec f/u with CT 1 yr, if low risk, no f/u needed  . Muscle weakness (generalized)   . Lack of coordination   . Psychosis   . Episodic mood disorder   .  Extrapyramidal disorder   . Altered mental status   . ALCOHOL ABUSE, HX OF 12/24/2006    Qualifier: Diagnosis of  By: [name]  MD, [name]    . COLON POLYP 12/24/2006    Qualifier: Diagnosis of  By: [name]    . Oral lesion 02/27/2011  . Subdural hematoma, post-traumatic 08/04/2013  . Protein-calorie malnutrition, severe 08/05/2013    Past Surgical History  Procedure Laterality Date  . Coronary artery bypass graft  2006  . Injection knee  09/06/2001  . Cardiovascular stress test  05/2008    small regions of perfusion abnormality suggestive of diaphragm artifact, exercise cap 7 METs  . Hip arthroplasty Left 07/30/2014    Procedure: ARTHROPLASTY BIPOLAR HIP;  Surgeon: Johnn Hai, MD;  Location: [place];  Service: Orthopedics;  Laterality: Left;    History   Social History  . Marital Status: Widowed    Spouse Name: N/A  . Number of Children: N/A  . Years of Education: N/A   Occupational History  . Retired Administrator, disability for back pain    Social History Main Topics  . Smoking status: Former Smoker    Quit date: 10/27/2005  . Smokeless tobacco:  Not on file  . Alcohol Use: No     Comment: Alcoholism-Sober-Quit x 3 years  . Drug Use: No     Comment: MJ-Quit x3 years  . Sexual Activity: No   Other Topics Concern  . Not on file   Social History Narrative   Lives with daughter and her fiance      8th grade education    Family History  Problem Relation Age of Onset  . Dementia Father     ? Alzheimers  . Heart attack Father   . Hypertension Brother   . Diabetes Brother       Medication List       This list is accurate as of: 02/12/15  1:59 PM.  Always use your most recent med list.               acetaminophen 325 MG tablet  Commonly known as:  TYLENOL  Take 650 mg by mouth every 6 (six) hours as needed for mild pain or fever.     ARIPiprazole 2 MG tablet  Commonly known as:  ABILIFY  Take 2 mg by mouth every other day.     CERTAGEN PO    Take 1 tablet by mouth daily.     cholecalciferol 1000 UNITS tablet  Commonly known as:  VITAMIN D  Take 1,000 Units by mouth daily.     docusate sodium 100 MG capsule  Commonly known as:  COLACE  Take 1 capsule (100 mg total) by mouth 2 (two) times daily as needed for mild constipation.     HYDROcodone-acetaminophen 5-325 MG per tablet  Commonly known as:  NORCO/VICODIN  Take one tablet by mouth every 4 hours as needed for pain     loratadine 10 MG tablet  Commonly known as:  CLARITIN  Take 10 mg by mouth daily.     Melatonin 3 MG Tabs  Take 3 mg by mouth at bedtime.     mirtazapine 7.5 MG tablet  Commonly known as:  REMERON  Take 7.5 mg by mouth at bedtime.     sertraline 25 MG tablet  Commonly known as:  ZOLOFT  Take 100 mg by mouth daily.        Physical Exam  BP 106/69 mmHg  Pulse 53  Temp(Src) 97.9 F (36.6 C)  Resp 16  Constitutional: frail elderly male in no acute distress. Garbled speech. HEENT: Normocephalic and atraumatic. PERRL. No icterus.  Neck: No JVD or carotid bruits. Cardiac: Normal S1, S2. RRR without appreciable murmurs, rubs, or gallops. Distal pulses intact. No dependent edema.  Respiratoy: No respiratory distress. Breath sounds clear bilaterally without rales, rhonchi, or wheezes. GI: Audible bowel sounds in all quadrants. Musculoskeletal: No joint erythema or tenderness. Skin/nails: Warm and dry. Resolving rash on BLE noted. Long mycotic toenails. Cuticle and nail fold of right great toe erythematous and tender to touch.  Neurological: Arouseable with confusions Psychiatric: Calm, but becomes restless when being disturbed   Labs Reviewed  CBC Latest Ref Rng 08/29/2014 08/01/2014 07/31/2014  WBC - 9.4 9.1 10.0  Hemoglobin 13.5 - 17.5 g/dL 13.6 9.9(L) 10.5(L)  Hematocrit 41 - 53 % 43 29.8(L) 32.0(L)  Platelets 150 - 399 K/L 171 92(L) 88(L)    CMP Latest Ref Rng 08/29/2014 08/01/2014 07/31/2014  Glucose 70 - 99 mg/dL - 147(H) 182(H)   BUN 4 - 21 mg/dL 17 22 25(H)  Creatinine 0.6 - 1.3 mg/dL 0.9 1.14 1.15  Sodium 137 - 147 mmol/L 138 140 137  Potassium 3.4 - 5.3 mmol/L 4.2 4.2 4.2  Chloride 96 - 112 mEq/L - 106 104  CO2 19 - 32 mEq/L - 22 22  Calcium 8.4 - 10.5 mg/dL - 7.9(L) 7.7(L)  Total Protein 6.0 - 8.3 g/dL - - -  Albumin - - - -  Total Bilirubin 0.3 - 1.2 mg/dL - - -  Alkaline Phos 25 - 125 U/L 201(A) - -  AST 14 - 40 U/L 16 - -  ALT 10 - 40 U/L 10 - -    Assessment & Plan 1. Paronychia of great toe, right Augmentin 875/125mg  twice daily x 7 days with florastor 250mg  twice daily x 7 days. Soak right great toe in warm water three to four times daily if possible.   2. Onychomycosis Long mycotic toenails. Podiatry referral for nail care.   Family/Staff Communication Plan of care discussed with nursing staff. Nursing staff verbalized understanding and agree with plan of care. No additional questions or concerns reported.    Arthur Holms, MSN, AGNP-C [facility] [address] [place], [place] [address] [phone] [8am-5pm]  After hours: [phone]

## 2015-02-18 NOTE — H&P (Signed)
PATIENT NAME:  [name], [name] MR#:  [id number] DATE OF BIRTH:  Mar 23, 1932  DATE OF ADMISSION:  03/07/2012  REFERRING PHYSICIAN: Dr. Benjaman Lobe   PRIMARY CARE PHYSICIAN: Dr. Lovie Macadamia at [facility]: Per records left facial drooping, leaning to the left, decrease in function from baseline and slurred speech.   HISTORY OF PRESENT ILLNESS: [name] is a pleasant 81 year old gentleman with history of dementia, hypertension, hyperlipidemia, coronary artery disease, questionable hypothyroidism, chronic obstructive pulmonary disease, cardiomyopathy, atrial flutter status post cardioversion who presents from [facility]. There is no family members in the room. Patient is unable to provide history. According to ED physician and staff it appears earlier today patient has had decline in his activities of daily living. At baseline he is functional and is able to care for himself and followed by patient having left facial drooping and drooling as well as slurred speech and preferential leading to the left. Patient is unable to stand on his own with poor ambulatory effort and abnormal gait. He denies any chest pain but his review of systems is unreliable due to his dementia.   PAST MEDICAL HISTORY:  1. Hypertension.  2. Hyperlipidemia.  3. Atrial flutter status post cardioversion.  4. Cardiomyopathy with low ejection fraction per records. No echocardiogram but catheterization March 2005 revealing for ejection fraction of 59% with significant LAD disease.  5. Coronary artery disease status post coronary artery bypass graft. 6. Chronic obstructive pulmonary disease and distant tobacco use.  7. History of alcohol abuse.  8. History of pancreatitis.  9. Colonic polyps.  10. Gastritis.  11. Mitral regurgitation.  12. Depression.  13. Questionable hypothyroidism. Patient is not on any Synthroid supplementation based on his MAR.   14. Diet-controlled diabetes.  15. History of cerebrovascular accident with reports of left-sided deficit.  16. Gastroesophageal reflux disease.  17. Dementia, multi-infarct.  18. History of C. difficile colitis.   PAST SURGICAL HISTORY:  1. CABG. 2. Left ear cancer resection.   ALLERGIES: Codeine.   MEDICATIONS:  1. Atorvastatin 10 mg daily.  2. Risperidone 2 mg at bedtime.   3. Namenda 5 mg b.i.d.  4. Lamotrigine 25 mg b.i.d.  5. Risperidone 1 mg q.a.m.  6. Sertraline 100 mg daily.  7. Artificial tear drops one drop in affected eye b.i.d.  8. Lactinex tablet 1 tablet daily.  9. Tamsulosin 0.4 mg daily.  10. Certa-Vite tablet daily.  11. Loratadine 10 mg daily.  12. Aricept 10 mg daily.  13. Vitamin B12 1000 mcg daily.  14. Aspirin 81 mg daily.  15. Vitamin A and D ointment as needed.  16. Bengay b.i.d. as needed.  17. Moisture barrier cream b.i.d. as needed.  18. Q-Pap 325 mg 2 tablets every 4 to 6 hours as needed.   SOCIAL HISTORY: From previous records, he has distant tobacco use, previous alcohol abuse but no longer. No history of drug abuse. He resides at [facility].   FAMILY HISTORY: Diabetes.   REVIEW OF SYSTEMS: Unable to obtain due to his dementia. Please refer to history of present illness.   PHYSICAL EXAMINATION:  VITAL SIGNS: Temperature 99, pulse 73, respiratory rate 18, blood pressure 117/71, sating 96% on room air.   GENERAL: Lying in bed in no apparent distress.   HEENT: Normocephalic, atraumatic. Pupils are constricted, equal and symmetric. Nonicteric sclera. Nares without discharge. Moist mucous membrane.   NECK: Soft and supple. No adenopathy or JVP.   CARDIOVASCULAR: Non-tachy.  He has a soft systolic murmur. No rub or gallop.   LUNGS: Faint basilar crackles. No use of accessory muscles or increased respiratory effort.   ABDOMEN: Soft. Positive bowel sounds. No mass appreciated.   EXTREMITIES: No edema. Dorsal pedis  pulses intact.   MUSCULOSKELETAL: No joint effusion.   SKIN: No rash.   NEUROLOGIC: No dysarthria or aphasia. He has decreased strength of upper extremities, 3/5. Appears to be more weak on the left. I do not appreciate a left facial droop. No drooling. No slurring of speech. He does have equal strength of his lower extremities.   PSYCH: Patient is alert, oriented to person but not time, place or situation.   LABORATORY, DIAGNOSTIC AND RADIOLOGICAL DATA: Urinalysis with specific gravity of 1.017, pH 6, nitrite positive, +1 plus leukocyte esterase, 2 per high-power field RBC, 19 per high-power field WBC. WBC 17.4, hemoglobin 14.3, hematocrit 43.5, platelets 131, MCV  92, glucose 102, BUN 18, creatinine 1.15, sodium 139, potassium 4.1, chloride 104, carbon dioxide 31, calcium 8.9, total bilirubin 1.2, alkaline phosphatase 86, ALT 17, AST 18, total protein 7.5, albumin 3.9, troponin less than 0.02. EKG with junctional rate of 72. There is no ST changes. Chest x-ray does not appear to have any infiltrate. Patient is rotated to the right. Official chest x-ray reading is pending. CT scan of the head with generalized atrophy and chronic small vessel ischemic changes, early cerebral infarct may be a possibility. No evidence of acute intracranial hemorrhage.  ASSESSMENT AND PLAN: [name] is a 81 year old gentleman with history of hypertension, hyperlipidemia, diabetes, coronary artery disease, prior cerebrovascular accident, MR and cardiomyopathy, dementia, questionable, hypothyroidism who presents from assisted living facility with decline in functional status, preferential leaning to the left, drooling, left facial drooping and slurred speech.  1. Presumed cerebrovascular accident. Symptoms could likely be consequences of urinary tract infection/sepsis, but still concerning for CVA and CT findings are questionable. Will admit to tele. Cycle cardiac enzymes. Stop aspirin. Start Plavix. Will send blood  cultures, urine culture. Ceftriaxone. Obtain carotid Doppler's, MRI of the brain, echocardiogram. Will send TSH, fasting lipid panel, A1c for risk stratification.  2. History of cardiomyopathy. Catheterization results as above. Again echo will be ordered. There is no evidence of decompensation. He does not appear to be on any congestive heart failure medications including diuretic, beta blocker or ACE inhibitor.  3. Coronary artery disease status post CABG. As above switch aspirin to Plavix. Again he is not on any other cardiac medications except for atorvastatin.  4. Chronic obstructive pulmonary disease. No acute issues. Patient is not on O2 at baseline.  5. Questionable history of hypothyroidism. As above will send a TSH. He is not on any Synthroid supplementation.  6. Prophylaxis with Plavix and Protonix.   TIME SPENT: Approximately 50 minutes on patient care.    ____________________________ Rita Ohara, MD ap:cms D: 03/07/2012 23:24:16 ET T: 03/08/2012 06:52:30 ET JOB#: [id number]  cc: Brien Few Nikiesha Milford, MD, <Dictator> Youlanda Roys. Lovie Macadamia, MD Rita Ohara MD ELECTRONICALLY SIGNED 03/21/2012 22:40

## 2015-02-18 NOTE — Discharge Summary (Signed)
PATIENT NAME:  [name], [name] MR#:  [id number] DATE OF BIRTH:  1931/11/27  DATE OF ADMISSION:  03/07/2012 DATE OF DISCHARGE:  03/10/2012  For a detailed note, please take a look at the history and physical done on admission by Dr. Inez Catalina.   DIAGNOSES AT DISCHARGE:  1. Altered mental status likely secondary to urinary tract infection and sepsis.  2. Sepsis secondary to urinary tract infection.  3. Suspected cerebrovascular accident, although ruled out.  4. Underlying dementia with agitation.  5. History of coronary artery disease.  6. Hypertension.  7. Benign prostatic hypertrophy.   DIET: Patient is being discharged on a low sodium, low fat diet.   ACTIVITY: As tolerated.   FOLLOW UP: Follow up with Dr. Laurian Brim in the next 1 to 2 weeks.    DISCHARGE MEDICATIONS:  1. Aricept 10 mg daily.  2. Lamictal 25 mg b.i.d.  3. Risperidone 1 mg daily.  4. Risperidone 2 mg at bedtime. 5. Zoloft 100 mg daily.  6. Artificial tears b.i.d.  7. Atorvastatin 10 mg daily.  8. Flomax 0.4 mg, 1 tab daily.  9. Multivitamin daily.  10. Loratadine 10 mg daily.  11. Vitamin B12 1000 mcg 1 tab daily.  12. Namenda 5 mg b.i.d.  13. Lotrimin deodorant powder to be applied to the toes b.i.d. as needed. 14. Risperidone 2 mg daily as needed.  15. Aspirin 81 mg daily. 16. Ceftin 500 mg b.i.d. x3 days.   LABORATORY, DIAGNOSTIC AND RADIOLOGICAL DATA: CT scan of the head done without contrast on 05/12 showing no acute intracranial process. A chest x-ray also done on admission showing chronic posttraumatic changes in the left hemithorax, evidence of chronic obstructive pulmonary disease but no evidence of pneumonia or congestive heart failure. Ultrasound of the carotids done on 05/13 showing no hemodynamically significant carotid stenosis with antegrade flow in both vertebrals. A two-dimensional echocardiogram showing no thrombus in the left atrium or left ventricle, normal LV function with trace mitral  regurgitation, ejection fraction 55%.   A urine culture positive for Klebsiella pneumoniae.   HOSPITAL COURSE: This is a 81 year old male with multiple medical problems as mentioned above presented to the hospital due to weakness drooling and left-sided facial drooping and also slurred speech.  1. Suspected cerebrovascular accident. Patient presented to the hospital with weakness with left-sided facial drooping and weakness. Patient underwent a CT scan of the head done on admission which showed no evidence of any acute abnormality. Patient also had a carotid duplex which was negative. He was switched over from aspirin to Plavix. His echocardiogram showed no thrombus. We attempted to get an MRI on the patient but he could not lie flat. Although his symptoms of mental status changes are probably related to urinary tract infection and unlikely cerebrovascular accident, therefore, no further need for MRI at this point. Patient has been switched back to aspirin from Plavix. He was evaluated by physical therapy and currently resides at an assisted living facility and needs higher level care therefore is being discharged to a skilled nursing facility. Patient's cerebrovascular accident has now currently been ruled out as his work-up has been essentially normal.  2. Sepsis. This was likely secondary to the urinary tract infection. His urine culture grew out Klebsiella. It was sensitive to ceftriaxone which is what the patient received. Patient currently is being discharged on a few more days of p.o. Ceftin. Patient did have one out of two bottles positive for gram-positive cocci in the blood culture, although the ID  was consistent with a staph epidermidis which is likely a skin contaminant.  3. Coronary disease, status post coronary artery bypass graft. Patient had no chest pain. He had three sets of cardiac markers checked which were negative. For now he will continue aspirin and statin as mentioned.  4. Dementia  with psychosis. Patient had no evidence of acute agitation. Part of his mental status changes probably related to his underlying dementia. As mentioned his mental status is now back to baseline. He will continue his Namenda, Aricept and risperidone as stated.  5. Depression. Patient was maintained on his Zoloft, he will resume that. 6. Gastroesophageal reflux disease. Patient was maintained on his Protonix and he will resume that upon discharge too.  7. As mentioned, patient does require high level of care and, therefore, he is being discharged to a skilled nursing facility instead of the assisted living. The family is in agreement with this plan.   TIME SPENT WITH DISCHARGE: 40 minutes.   ____________________________ Belia Heman. Verdell Carmine, MD vjs:cms D: 03/10/2012 11:16:40 ET T: 03/10/2012 11:33:12 ET JOB#: [id number]  cc: Belia Heman. Verdell Carmine, MD, <Dictator> Morton Peters., MD Henreitta Leber MD ELECTRONICALLY SIGNED 03/10/2012 14:22

## 2015-02-19 ENCOUNTER — Non-Acute Institutional Stay (SKILLED_NURSING_FACILITY): Payer: Medicare Other | Admitting: Internal Medicine

## 2015-02-19 DIAGNOSIS — L03031 Cellulitis of right toe: Secondary | ICD-10-CM | POA: Diagnosis not present

## 2015-02-19 NOTE — Progress Notes (Signed)
Patient ID: [name], male   DOB: [birth year], 81 y.o.   MRN: [id number]    Chief Complaint  Patient presents with  . Acute Visit    redness and swelling of right great toe   Allergies  Allergen Reactions  . Codeine     REACTION: Rash,itching   HPI 81 y/o male patient seen today for acute concerns. He has been having redness and swelling of right great toe for a week now. He was placed on augmentin for a week and finishes the course today. as per staff, the swelling and redness has worsened.  He has dementia. He is unable to provide any HPI or ROS. No fever reported by staff. No witness fall or trauma to toe by staff members. Staff have noticed some blood stain on his toe and socks He is sitting on a gerichair and in no distress  ROS Unable to obtain  Past Medical History  Diagnosis Date  . Anxiety state, unspecified   . Arthritis     fingers/shoulders  . Unspecified cataract   . Colon polyps     last colonoscopy 2010  . CAD (coronary artery disease) 2006    s/p CABG  . Senile dementia, uncomplicated   . Depression   . HLD (hyperlipidemia)   . Gastric ulcer, unspecified as acute or chronic, without mention of hemorrhage, perforation, or obstruction   . Hypertension   . Insomnia   . TIA (transient ischemic attack)   . Clostridium difficile infection 2012    recurrent-last treatment 10/2010  . BPH (benign prostatic hypertrophy)   . H/O: hypothyroidism   . History of chronic pancreatitis   . Alcohol abuse, in remission     sober x years  . Paroxysmal a-fib     history of  . Hx: UTI (urinary tract infection)   . Diabetes mellitus type II     history of  . ARF (acute renal failure)     history of  . History of chicken pox   . Pulmonary nodule/lesion, solitary 05/2010    7mm L lung base nodule, if high risk rec f/u with CT 1 yr, if low risk, no f/u needed  . Muscle weakness (generalized)   . Lack of coordination   . Psychosis   . Episodic mood disorder   .  Extrapyramidal disorder   . Altered mental status   . ALCOHOL ABUSE, HX OF 12/24/2006    Qualifier: Diagnosis of  By: [name]  MD, [name]    . COLON POLYP 12/24/2006    Qualifier: Diagnosis of  By: [name]    . Oral lesion 02/27/2011  . Subdural hematoma, post-traumatic 08/04/2013  . Protein-calorie malnutrition, severe 08/05/2013   Medication reviewed. See [facility]  Physical exam BP 103/63 mmHg  Pulse 69  Temp(Src) 98 F (36.7 C)  Resp 18  General- elderly frail male in no acute distress Head- atraumatic, normocephalic Neck- no lymphadenopathy Cardiovascular- normal s1,s2, no murmurs, no edema, dorsalis pedis palpable Respiratory- bilateral clear to auscultation, no wheeze, no rhonchi, no crackles Abdomen- bowel sounds present, soft, non tender Musculoskeletal- able to move all 4 extremities, on gerichair, erythema and warmth of the right great toe, soft tissue swelling, tenderness to touch on right great toe with pt pulling on his leg, blood stain on the cuticle and nail bed noted, no crepitus in the toe, no purulent drainage noted, rest of toes look fine Neurological- severe dementia  Assessment/plan  Right toe cellulitis No crepitus on exam,  redness, warmth and tenderness present. Start doxycycline 100 mg bid x 2 weeks, get cbc with diff and uric acid level to assess for infection and gout. Get xray of right great toe to rule out bone infection. Also start tylenol 1000 mg tid for now for pain x 5 days, then q8h prn only. Reassess if no improvement. Recommend wrapping the toe with gauze with cushion/ pad around the toe to prevent further trauma and skin breakdown which can worsen infection.

## 2015-02-20 LAB — CBC AND DIFFERENTIAL
HCT: 44 % (ref 41–53)
Hemoglobin: 15 g/dL (ref 13.5–17.5)
PLATELETS: 175 10*3/uL (ref 150–399)
WBC: 6.4 10*3/mL

## 2015-02-28 ENCOUNTER — Encounter: Payer: Self-pay | Admitting: Registered Nurse

## 2015-02-28 ENCOUNTER — Non-Acute Institutional Stay (SKILLED_NURSING_FACILITY): Payer: Medicare Other | Admitting: Registered Nurse

## 2015-02-28 DIAGNOSIS — E43 Unspecified severe protein-calorie malnutrition: Secondary | ICD-10-CM

## 2015-02-28 DIAGNOSIS — F0280 Dementia in other diseases classified elsewhere without behavioral disturbance: Secondary | ICD-10-CM | POA: Diagnosis not present

## 2015-02-28 DIAGNOSIS — L03031 Cellulitis of right toe: Secondary | ICD-10-CM | POA: Diagnosis not present

## 2015-02-28 DIAGNOSIS — J309 Allergic rhinitis, unspecified: Secondary | ICD-10-CM | POA: Diagnosis not present

## 2015-02-28 DIAGNOSIS — G309 Alzheimer's disease, unspecified: Secondary | ICD-10-CM

## 2015-02-28 DIAGNOSIS — F29 Unspecified psychosis not due to a substance or known physiological condition: Secondary | ICD-10-CM | POA: Diagnosis not present

## 2015-02-28 DIAGNOSIS — F0393 Unspecified dementia, unspecified severity, with mood disturbance: Secondary | ICD-10-CM

## 2015-02-28 DIAGNOSIS — G4700 Insomnia, unspecified: Secondary | ICD-10-CM | POA: Diagnosis not present

## 2015-02-28 DIAGNOSIS — F0150 Vascular dementia without behavioral disturbance: Secondary | ICD-10-CM | POA: Diagnosis not present

## 2015-02-28 DIAGNOSIS — F329 Major depressive disorder, single episode, unspecified: Secondary | ICD-10-CM

## 2015-02-28 DIAGNOSIS — Z0000 Encounter for general adult medical examination without abnormal findings: Secondary | ICD-10-CM

## 2015-02-28 NOTE — Progress Notes (Signed)
Patient ID: [name], male   DOB: July 28, 1932, 81 y.o.   MRN: [id number]   Place of Service: [facility]  Allergies  Allergen Reactions  . Codeine     REACTION: Rash,itching    Code Status: DNR   Goals of Care: Comfort and Quality of Life/LTC  Chief Complaint  Patient presents with  . Annual Exam    HPI 81 y.o. male with PMHof mixed AD & vasular dementia with behavioral disturbance, allergic rhinitis, HTN, constipation, insomnia, left hip fracture among others is being seen for an annual health visit and routine visit for management of his chronic issues. Gradual weight loss, but weight is stable over the past 30 days. No recent fall reported or new skin concerns reported. No change in behaviors or functional status reported. No concerns from staff. Is up to date with his influenza and pna vaccines. Seen in geri chair today. Unable to participate in HPI and ROS but appears in no distress. Still on doxycycline for right great toe cellulitis. Dementia is advanced.  Mood stable on zoloft, remeron, and alibify.  Insomnia stable on melatonin. AR is stable with claritin. No issues with constipation.   Review of Systems Unable to obtained due to dementia but appears in no acute distress  Past Medical History  Diagnosis Date  . Anxiety state, unspecified   . Arthritis     fingers/shoulders  . Unspecified cataract   . Colon polyps     last colonoscopy 2010  . CAD (coronary artery disease) 2006    s/p CABG  . Senile dementia, uncomplicated   . Depression   . HLD (hyperlipidemia)   . Gastric ulcer, unspecified as acute or chronic, without mention of hemorrhage, perforation, or obstruction   . Hypertension   . Insomnia   . TIA (transient ischemic attack)   . Clostridium difficile infection 2012    recurrent-last treatment 10/2010  . BPH (benign prostatic hypertrophy)   . H/O: hypothyroidism   . History of chronic pancreatitis   . Alcohol abuse, in remission    sober x years  . Paroxysmal a-fib     history of  . Hx: UTI (urinary tract infection)   . Diabetes mellitus type II     history of  . ARF (acute renal failure)     history of  . History of chicken pox   . Pulmonary nodule/lesion, solitary 05/2010    77mm L lung base nodule, if high risk rec f/u with CT 1 yr, if low risk, no f/u needed  . Muscle weakness (generalized)   . Lack of coordination   . Psychosis   . Episodic mood disorder   . Extrapyramidal disorder   . Altered mental status   . ALCOHOL ABUSE, HX OF 12/24/2006    Qualifier: Diagnosis of  By: [name]  MD, [name]    . COLON POLYP 12/24/2006    Qualifier: Diagnosis of  By: [name]    . Oral lesion 02/27/2011  . Subdural hematoma, post-traumatic 08/04/2013  . Protein-calorie malnutrition, severe 08/05/2013    Past Surgical History  Procedure Laterality Date  . Coronary artery bypass graft  2006  . Injection knee  09/06/2001  . Cardiovascular stress test  05/2008    small regions of perfusion abnormality suggestive of diaphragm artifact, exercise cap 7 METs  . Hip arthroplasty Left 07/30/2014    Procedure: ARTHROPLASTY BIPOLAR HIP;  Surgeon: Johnn Hai, MD;  Location: [facility];  Service: Orthopedics;  Laterality:  Left;    History   Social History  . Marital Status: Widowed    Spouse Name: N/A  . Number of Children: N/A  . Years of Education: N/A   Occupational History  . Retired Administrator, disability for back pain    Social History Main Topics  . Smoking status: Former Smoker    Quit date: 10/27/2005  . Smokeless tobacco: Not on file  . Alcohol Use: No     Comment: Alcoholism-Sober-Quit x 3 years  . Drug Use: No     Comment: MJ-Quit x3 years  . Sexual Activity: No   Other Topics Concern  . Not on file   Social History Narrative   Lives with daughter and her fiance      8th grade education    Family History  Problem Relation Age of Onset  . Dementia Father     ? Alzheimers  . Heart  attack Father   . Hypertension Brother   . Diabetes Brother       Medication List       This list is accurate as of: 02/28/15  3:07 PM.  Always use your most recent med list.               acetaminophen 325 MG tablet  Commonly known as:  TYLENOL  Take 650 mg by mouth every 6 (six) hours as needed for mild pain or fever.     ARIPiprazole 2 MG tablet  Commonly known as:  ABILIFY  Take 2 mg by mouth every other day.     CERTAGEN PO  Take 1 tablet by mouth daily.     cholecalciferol 1000 UNITS tablet  Commonly known as:  VITAMIN D  Take 1,000 Units by mouth daily.     docusate sodium 100 MG capsule  Commonly known as:  COLACE  Take 1 capsule (100 mg total) by mouth 2 (two) times daily as needed for mild constipation.     HYDROcodone-acetaminophen 5-325 MG per tablet  Commonly known as:  NORCO/VICODIN  Take one tablet by mouth every 4 hours as needed for pain     loratadine 10 MG tablet  Commonly known as:  CLARITIN  Take 10 mg by mouth daily.     Melatonin 3 MG Tabs  Take 3 mg by mouth at bedtime.     mirtazapine 7.5 MG tablet  Commonly known as:  REMERON  Take 7.5 mg by mouth at bedtime.     sertraline 25 MG tablet  Commonly known as:  ZOLOFT  Take 100 mg by mouth daily.        Physical Exam  BP 142/72 mmHg  Pulse 58  Temp(Src) 97.8 F (36.6 C)  Resp 16  Ht 6\' 1"  (1.854 m)  Wt 135 lb (61.236 kg)  BMI 17.82 kg/m2  Constitutional: frail elderly male in no acute distress. Garbled speech. HEENT: Normocephalic and atraumatic. PERRL. No icterus.  Neck: No lymphadenopathy, masses, or thyromegaly. No JVD or carotid bruits. Cardiac: Normal S1, S2. RRR without appreciable murmurs, rubs, or gallops. Distal pulses intact. No dependent edema.  Respiratory: No respiratory distress. Breath sounds clear bilaterally without rales, rhonchi, or wheezes. GI: Audible bowel sounds in all quadrants. Soft, nontender, nondistended.  Musculoskeletal: No joint erythema or  tenderness. Skin: Warm and dry. Multiple red healing lesions noted on BLE. Skin of right great toe is still erythematous but much improved since last time-no drainage noted.  Neurological: Arouseable with confusions Psychiatric: Calm  Labs Reviewed  CBC Latest Ref Rng 02/20/2015 08/29/2014 08/01/2014  WBC - 6.4 9.4 9.1  Hemoglobin 13.5 - 17.5 g/dL 15.0 13.6 9.9(L)  Hematocrit 41 - 53 % 44 43 29.8(L)  Platelets 150 - 399 K/L 175 171 92(L)    CMP Latest Ref Rng 02/05/2015 08/29/2014 08/01/2014  Glucose 70 - 99 mg/dL - - 147(H)  BUN 4 - 21 mg/dL 18 17 22   Creatinine 0.6 - 1.3 mg/dL 0.9 0.9 1.14  Sodium 137 - 147 mmol/L 143 138 140  Potassium 3.4 - 5.3 mmol/L 4.3 4.2 4.2  Chloride 96 - 112 mEq/L - - 106  CO2 19 - 32 mEq/L - - 22  Calcium 8.4 - 10.5 mg/dL - - 7.9(L)  Total Protein 6.0 - 8.3 g/dL - - -  Albumin - - - -  Total Bilirubin 0.3 - 1.2 mg/dL - - -  Alkaline Phos 25 - 125 U/L - 201(A) -  AST 14 - 40 U/L - 16 -  ALT 10 - 40 U/L - 10 -    Lipid Panel     Component Value Date/Time   CHOL 131 11/15/2013   TRIG 97 11/15/2013   HDL 45 11/15/2013   CHOLHDL 2.6 11/13/2009 0500   VLDL 16 11/13/2009 0500   LDLCALC 67 11/15/2013   LDLDIRECT 75.4 01/27/2011 1122    Lab Results  Component Value Date   HGBA1C 5.8 09/06/2014    Lab Results  Component Value Date   TSH 2.04 12/06/2014    Assessment & Plan 1. Mixed Alzheimer's and vascular dementia Advanced. Further declined anticipated. Continue total assist with ADLs with fall risk and pressure ulcer precautions. Monitor for change in behavior  2. Depression due to dementia Mood stable. Continue zoloft 100mg  daily and remeron 7.5mg  daily at bedtime. Monitor for change in mood  3. Psychosis, unspecified psychosis type Stable. Is followed by psy. Continue abilify 2mg  every other day.  4. Allergic rhinitis, unspecified allergic rhinitis type Stable. Continue claritin 10mg  daily  5. Protein-calorie malnutrition,  severe Weight stable over the past 30 days; however, weight loss is anticipated with advanced dementia. Continue mechanical soft diet with fortified foods to all meals and dietary supplement for nutritional support.   6. Insomnia Stable. Continue melatonin 3mg  daily and remeron 7.5mg  daily at bedtime and monitor.   7. Right great toe cellulitis Improving. Continue doxycycline 100mg  twice daily for another 5 days. Continue to monitor  8. Annual wellness exam Is up to date with influenza and pna vaccines. Not a candidate for DEXA. FOBT annually for colon cancer screening. Oscal 500/400mg  twice daily for bone health. Continue to offer resident with recommended immunizations and screenings as appropriate for age and health status.   Family/Staff Communication Plan of care discussed with nursing staff. Nursing staff verbalized understanding and agree with plan of care. No additional questions or concerns reported.    Arthur Holms, MSN, AGNP-C [facility] [address] [place], [place] [address] [phone] [8am-5pm]  After hours: [phone]

## 2015-04-05 ENCOUNTER — Encounter: Payer: Self-pay | Admitting: Internal Medicine

## 2015-04-05 ENCOUNTER — Non-Acute Institutional Stay (SKILLED_NURSING_FACILITY): Payer: Medicare Other | Admitting: Internal Medicine

## 2015-04-05 DIAGNOSIS — F0391 Unspecified dementia with behavioral disturbance: Secondary | ICD-10-CM | POA: Diagnosis not present

## 2015-04-05 DIAGNOSIS — F329 Major depressive disorder, single episode, unspecified: Secondary | ICD-10-CM

## 2015-04-05 DIAGNOSIS — E43 Unspecified severe protein-calorie malnutrition: Secondary | ICD-10-CM | POA: Diagnosis not present

## 2015-04-05 DIAGNOSIS — J309 Allergic rhinitis, unspecified: Secondary | ICD-10-CM

## 2015-04-05 DIAGNOSIS — G4700 Insomnia, unspecified: Secondary | ICD-10-CM | POA: Diagnosis not present

## 2015-04-05 DIAGNOSIS — F0280 Dementia in other diseases classified elsewhere without behavioral disturbance: Secondary | ICD-10-CM | POA: Diagnosis not present

## 2015-04-05 DIAGNOSIS — R1310 Dysphagia, unspecified: Secondary | ICD-10-CM | POA: Diagnosis not present

## 2015-04-05 DIAGNOSIS — F03918 Unspecified dementia, unspecified severity, with other behavioral disturbance: Secondary | ICD-10-CM

## 2015-04-05 DIAGNOSIS — F0393 Unspecified dementia, unspecified severity, with mood disturbance: Secondary | ICD-10-CM

## 2015-04-05 NOTE — Progress Notes (Signed)
Patient ID: [name], male   DOB: 07/10/1932, 81 y.o.   MRN: [id number]     [facility]  PCP: Blanchie Serve, MD  Code Status: DNR  Allergies  Allergen Reactions  . Codeine     REACTION: Rash,itching    Chief Complaint  Patient presents with  . Medical Management of Chronic Issues    Routine Visit      HPI:  81 year old patient is here for long term care and is seen today for routine visit. He has medical history of advanced dementia, HTN, constipation, allergic rhinitis among others. He is sitting on his geri chair in the hallway. He odes not participate in HPI and ROS. He is under total care. No falls reported. He is eating 100% of his meals with assistance. The toe wound has healed. No acute behavior changes reported. Has gained 3 lbs over last month  Review of Systems:  Unable to obtain    Past Medical History  Diagnosis Date  . Anxiety state, unspecified   . Arthritis     fingers/shoulders  . Unspecified cataract   . Colon polyps     last colonoscopy 2010  . CAD (coronary artery disease) 2006    s/p CABG  . Senile dementia, uncomplicated   . Depression   . HLD (hyperlipidemia)   . Gastric ulcer, unspecified as acute or chronic, without mention of hemorrhage, perforation, or obstruction   . Hypertension   . Insomnia   . TIA (transient ischemic attack)   . Clostridium difficile infection 2012    recurrent-last treatment 10/2010  . BPH (benign prostatic hypertrophy)   . H/O: hypothyroidism   . History of chronic pancreatitis   . Alcohol abuse, in remission     sober x years  . Paroxysmal a-fib     history of  . Hx: UTI (urinary tract infection)   . Diabetes mellitus type II     history of  . ARF (acute renal failure)     history of  . History of chicken pox   . Pulmonary nodule/lesion, solitary 05/2010    61mm L lung base nodule, if high risk rec f/u with CT 1 yr, if low risk, no f/u needed  . Muscle weakness (generalized)   .  Lack of coordination   . Psychosis   . Episodic mood disorder   . Extrapyramidal disorder   . Altered mental status   . ALCOHOL ABUSE, HX OF 12/24/2006    Qualifier: Diagnosis of  By: [name]  MD, [name]    . COLON POLYP 12/24/2006    Qualifier: Diagnosis of  By: [name]    . Oral lesion 02/27/2011  . Subdural hematoma, post-traumatic 08/04/2013  . Protein-calorie malnutrition, severe 08/05/2013   Past Surgical History  Procedure Laterality Date  . Coronary artery bypass graft  2006  . Injection knee  09/06/2001  . Cardiovascular stress test  05/2008    small regions of perfusion abnormality suggestive of diaphragm artifact, exercise cap 7 METs  . Hip arthroplasty Left 07/30/2014    Procedure: ARTHROPLASTY BIPOLAR HIP;  Surgeon: Johnn Hai, MD;  Location: [place];  Service: Orthopedics;  Laterality: Left;    Medications: Patient's Medications  New Prescriptions   No medications on file  Previous Medications   ACETAMINOPHEN (TYLENOL) 325 MG TABLET    Take 650 mg by mouth every 8 (eight) hours as needed for mild pain or fever (Toe pain).    ARIPIPRAZOLE (  ABILIFY) 2 MG TABLET    Take 2 mg by mouth every other day.    CHOLECALCIFEROL (VITAMIN D) 1000 UNITS TABLET    Take 1,000 Units by mouth daily.   LORATADINE (CLARITIN) 10 MG TABLET    Take 10 mg by mouth daily.   MELATONIN 3 MG TABS    Take 3 mg by mouth at bedtime.   MIRTAZAPINE (REMERON) 7.5 MG TABLET    Take 7.5 mg by mouth at bedtime.   MULTIPLE VITAMINS-MINERALS (CERTAGEN PO)    Take 1 tablet by mouth daily.   SERTRALINE (ZOLOFT) 100 MG TABLET    Take 100 mg by mouth daily. For depression/alzheimers vascular dementia  Modified Medications   No medications on file  Discontinued Medications   DOCUSATE SODIUM (COLACE) 100 MG CAPSULE    Take 1 capsule (100 mg total) by mouth 2 (two) times daily as needed for mild constipation.   HYDROCODONE-ACETAMINOPHEN (NORCO/VICODIN) 5-325 MG PER TABLET    Take one tablet by mouth  every 4 hours as needed for pain   SERTRALINE (ZOLOFT) 25 MG TABLET    Take 100 mg by mouth daily.      Physical Exam Filed Vitals:   04/05/15 1003  BP: 94/57  Pulse: 97  Temp: 97.3 F (36.3 C)  TempSrc: Oral  Resp: 16  Height: 6\' 1"  (1.854 m)  Weight: 138 lb 12.8 oz (62.959 kg)   Wt Readings from Last 3 Encounters:  04/05/15 138 lb 12.8 oz (62.959 kg)  02/28/15 135 lb (61.236 kg)  02/02/15 135 lb (61.236 kg)   General- elderly frail male, in no acute distress Head- normocephalic, atraumatic Nose- normal nasal mucosa, no maxillary or frontal sinus tenderness, no nasal discharge Throat- moist mucus membrane Neck- no cervical lymphadenopathy, no supraclavicular lymphadenopathy, no thyromegaly, no jugular vein distension Cardiovascular- normal s1,s2, no murmurs, palpable dorsalis pedis and radial pulses, no leg edema Respiratory- bilateral clear to auscultation, no wheeze, no rhonchi, no crackles, no use of accessory muscles Abdomen- bowel sounds present, soft, non tender Musculoskeletal- able to move all 4 extremities, no joint redness or tenderness Neurological- arousable with baseline confusion Skin- warm and dry, erythematous areas on both legs have improved from before Psychiatry- calm and confused    Labs reviewed: Basic Metabolic Panel:  Recent Labs  07/29/14 1716 07/31/14 0557 08/01/14 0543 08/29/14 02/05/15  NA 143 137 140 138 143  K 4.1 4.2 4.2 4.2 4.3  CL 107 104 106  --   --   CO2  --  22 22  --   --   GLUCOSE 134* 182* 147*  --   --   BUN 29* 25* 22 17 18   CREATININE 1.30 1.15 1.14 0.9 0.9  CALCIUM  --  7.7* 7.9*  --   --    Liver Function Tests:  Recent Labs  08/29/14  AST 16  ALT 10  ALKPHOS 201*   No results for input(s): LIPASE, AMYLASE in the last 8760 hours. No results for input(s): AMMONIA in the last 8760 hours. CBC:  Recent Labs  07/29/14 1700  07/30/14 1500 07/31/14 0557 08/01/14 0543 08/29/14 02/20/15  WBC 15.5*  --  13.3*  10.0 9.1 9.4 6.4  NEUTROABS 13.1*  --   --   --   --   --   --   HGB 15.2  < > 12.1* 10.5* 9.9* 13.6 15.0  HCT 44.1  < > 35.6* 32.0* 29.8* 43 44  MCV 86.5  --  87.5  87.9 87.9  --   --   PLT 141*  --  105* 88* 92* 171 175  < > = values in this interval not displayed.  CBG:  Recent Labs  08/02/14 0644 08/02/14 1105 08/02/14 1625  GLUCAP 143* 108* 206*    Assessment/Plan  Protein-calorie malnutrition, severe Weight stable, has gained 3 lbs. Continue remeron. Continue mechanical soft diet with fortified foods. Continue MVI  Dysphagia Continue emchanical soft diet with thin liquid, tolerating well, assistance with feeding. Aspiration precautions  Allergic rhinitis Stable . Change claritin to 10 mg every day as needed and reassess  Mixed Alzheimer's and vascular dementia Advanced. Further declined anticipated. Continue total assist with ADLs with fall risk and pressure ulcer precautions. Monitor for change in behavior  Depression due to dementia Mood stable. Continue zoloft 100mg  daily and remeron 7.5mg  daily at bedtime. Monitor for change in mood. Followed by psych services  Insomnia Continue melatonin 3mg  daily    Blanchie Serve, MD  [facility] [phone] (Monday-Friday 8 am - 5 pm) [phone] (afterhours)

## 2015-06-04 ENCOUNTER — Non-Acute Institutional Stay (SKILLED_NURSING_FACILITY): Payer: Medicare Other | Admitting: Nurse Practitioner

## 2015-06-04 DIAGNOSIS — F32A Depression, unspecified: Secondary | ICD-10-CM

## 2015-06-04 DIAGNOSIS — F0391 Unspecified dementia with behavioral disturbance: Secondary | ICD-10-CM

## 2015-06-04 DIAGNOSIS — G4700 Insomnia, unspecified: Secondary | ICD-10-CM

## 2015-06-04 DIAGNOSIS — F329 Major depressive disorder, single episode, unspecified: Secondary | ICD-10-CM

## 2015-06-04 DIAGNOSIS — E43 Unspecified severe protein-calorie malnutrition: Secondary | ICD-10-CM | POA: Diagnosis not present

## 2015-06-04 DIAGNOSIS — E559 Vitamin D deficiency, unspecified: Secondary | ICD-10-CM

## 2015-06-04 DIAGNOSIS — F03918 Unspecified dementia, unspecified severity, with other behavioral disturbance: Secondary | ICD-10-CM

## 2015-06-04 NOTE — Progress Notes (Signed)
Patient ID: [name], male   DOB: 1932-07-23, 81 y.o.   MRN: [id number]    Nursing Home Location:  [place] of Service: SNF (31)  PCP: Blanchie Serve, MD  Allergies  Allergen Reactions  . Codeine     REACTION: Rash,itching    Chief Complaint  Patient presents with  . Medical Management of Chronic Issues    HPI:  Patient is a 81 y.o. male seen today at [facility] for routine follow up on chronic conditions. has medical history of advanced dementia, HTN, constipation, allergic rhinitis, CAD, HLD, depression. Pt unable to participate in ROS or HPI due to advanced dementia.  Staff providing total care. Pt conts to eat well with assistance. There has been no acute changes in the last month. Mood has been good. Pt has been weaned from Abilify with no adverse effects. Bowel moving without problems. Staff without concerns at this time  Review of Systems:  Review of Systems  Unable to perform ROS: Dementia    Past Medical History  Diagnosis Date  . Anxiety state, unspecified   . Arthritis     fingers/shoulders  . Unspecified cataract   . Colon polyps     last colonoscopy 2010  . CAD (coronary artery disease) 2006    s/p CABG  . Senile dementia, uncomplicated   . Depression   . HLD (hyperlipidemia)   . Gastric ulcer, unspecified as acute or chronic, without mention of hemorrhage, perforation, or obstruction   . Hypertension   . Insomnia   . TIA (transient ischemic attack)   . Clostridium difficile infection 2012    recurrent-last treatment 10/2010  . BPH (benign prostatic hypertrophy)   . H/O: hypothyroidism   . History of chronic pancreatitis   . Alcohol abuse, in remission     sober x years  . Paroxysmal a-fib     history of  . Hx: UTI (urinary tract infection)   . Diabetes mellitus type II     history of  . ARF (acute renal failure)     history of  . History of chicken pox   . Pulmonary nodule/lesion, solitary  05/2010    1mm L lung base nodule, if high risk rec f/u with CT 1 yr, if low risk, no f/u needed  . Muscle weakness (generalized)   . Lack of coordination   . Psychosis   . Episodic mood disorder   . Extrapyramidal disorder   . Altered mental status   . ALCOHOL ABUSE, HX OF 12/24/2006    Qualifier: Diagnosis of  By: [name]  MD, [name]    . COLON POLYP 12/24/2006    Qualifier: Diagnosis of  By: [name]    . Oral lesion 02/27/2011  . Subdural hematoma, post-traumatic 08/04/2013  . Protein-calorie malnutrition, severe 08/05/2013   Past Surgical History  Procedure Laterality Date  . Coronary artery bypass graft  2006  . Injection knee  09/06/2001  . Cardiovascular stress test  05/2008    small regions of perfusion abnormality suggestive of diaphragm artifact, exercise cap 7 METs  . Hip arthroplasty Left 07/30/2014    Procedure: ARTHROPLASTY BIPOLAR HIP;  Surgeon: Johnn Hai, MD;  Location: [place];  Service: Orthopedics;  Laterality: Left;   Social History:   reports that he quit smoking about 9 years ago. He does not have any smokeless tobacco history on file. He reports that he does not drink alcohol or use  illicit drugs.  Family History  Problem Relation Age of Onset  . Dementia Father     ? Alzheimers  . Heart attack Father   . Hypertension Brother   . Diabetes Brother     Medications: Patient's Medications  New Prescriptions   No medications on file  Previous Medications   ACETAMINOPHEN (TYLENOL) 325 MG TABLET    Take 650 mg by mouth every 8 (eight) hours as needed for mild pain or fever (Toe pain).    CHOLECALCIFEROL (VITAMIN D) 1000 UNITS TABLET    Take 1,000 Units by mouth daily.   LORATADINE (CLARITIN) 10 MG TABLET    Take 10 mg by mouth daily as needed.    MELATONIN 3 MG TABS    Take 3 mg by mouth at bedtime.   MIRTAZAPINE (REMERON) 7.5 MG TABLET    Take 7.5 mg by mouth at bedtime.   MULTIPLE VITAMINS-MINERALS (CERTAGEN PO)    Take 1 tablet by mouth daily.    SERTRALINE (ZOLOFT) 100 MG TABLET    Take 100 mg by mouth daily. For depression/alzheimers vascular dementia  Modified Medications   No medications on file  Discontinued Medications   ARIPIPRAZOLE (ABILIFY) 2 MG TABLET    Take 2 mg by mouth every other day.      Physical Exam: Filed Vitals:   06/04/15 1533  BP: 110/62  Pulse: 65  Temp: 97.5 F (36.4 C)  Resp: 20  Weight: 137 lb (62.143 kg)    Physical Exam  Constitutional: No distress.  Frail elderly male, NAD  HENT:  Head: Normocephalic and atraumatic.  Neck: Normal range of motion. Neck supple.  Cardiovascular: Normal rate, regular rhythm and normal heart sounds.   Pulmonary/Chest: Effort normal and breath sounds normal.  Abdominal: Soft. Bowel sounds are normal.  Musculoskeletal: He exhibits no edema or tenderness.  Neurological: He is alert.  Skin: Skin is warm and dry. He is not diaphoretic.  Psychiatric: He has a normal mood and affect. Cognition and memory are impaired. He exhibits abnormal recent memory and abnormal remote memory.    Labs reviewed: Basic Metabolic Panel:  Recent Labs  07/29/14 1716 07/31/14 0557 08/01/14 0543 08/29/14 02/05/15  NA 143 137 140 138 143  K 4.1 4.2 4.2 4.2 4.3  CL 107 104 106  --   --   CO2  --  22 22  --   --   GLUCOSE 134* 182* 147*  --   --   BUN 29* 25* 22 17 18   CREATININE 1.30 1.15 1.14 0.9 0.9  CALCIUM  --  7.7* 7.9*  --   --    Liver Function Tests:  Recent Labs  08/29/14  AST 16  ALT 10  ALKPHOS 201*   No results for input(s): LIPASE, AMYLASE in the last 8760 hours. No results for input(s): AMMONIA in the last 8760 hours. CBC:  Recent Labs  07/29/14 1700  07/30/14 1500 07/31/14 0557 08/01/14 0543 08/29/14 02/20/15  WBC 15.5*  --  13.3* 10.0 9.1 9.4 6.4  NEUTROABS 13.1*  --   --   --   --   --   --   HGB 15.2  < > 12.1* 10.5* 9.9* 13.6 15.0  HCT 44.1  < > 35.6* 32.0* 29.8* 43 44  MCV 86.5  --  87.5 87.9 87.9  --   --   PLT 141*  --  105* 88* 92*  171 175  < > = values in this interval not displayed.  TSH:  Recent Labs  12/06/14  TSH 2.04   A1C: Lab Results  Component Value Date   HGBA1C 5.8 09/06/2014   Lipid Panel: No results for input(s): CHOL, HDL, LDLCALC, TRIG, CHOLHDL, LDLDIRECT in the last 8760 hours.   Assessment/Plan 1. Vitamin D deficiency Stable conts on vit d 1000 units daily  2. Dementia with behavioral disturbance Advanced dementia, no recent behaviors. Mood has been stable, depends on staff for total care.   3. Insomnia Stable on melatonin   4. Depression -stable, maintained on zoloft 100 mg daily  5. Protein-calorie malnutrition, severe Staff reports good oral intake, eating ~100%, weight has been stable in the last month. Supportive care by staff. Cont to monitor.     Carlos American. Harle Battiest  [facility] [phone] 8 am - 5 pm) [phone] (after hours)

## 2015-07-05 ENCOUNTER — Non-Acute Institutional Stay (SKILLED_NURSING_FACILITY): Payer: Medicare Other | Admitting: Internal Medicine

## 2015-07-05 DIAGNOSIS — M899 Disorder of bone, unspecified: Secondary | ICD-10-CM | POA: Diagnosis not present

## 2015-07-05 DIAGNOSIS — J309 Allergic rhinitis, unspecified: Secondary | ICD-10-CM | POA: Diagnosis not present

## 2015-07-05 DIAGNOSIS — R1310 Dysphagia, unspecified: Secondary | ICD-10-CM | POA: Diagnosis not present

## 2015-07-05 DIAGNOSIS — E46 Unspecified protein-calorie malnutrition: Secondary | ICD-10-CM | POA: Diagnosis not present

## 2015-07-05 DIAGNOSIS — G309 Alzheimer's disease, unspecified: Secondary | ICD-10-CM | POA: Diagnosis not present

## 2015-07-05 DIAGNOSIS — F0393 Unspecified dementia, unspecified severity, with mood disturbance: Secondary | ICD-10-CM

## 2015-07-05 DIAGNOSIS — G4700 Insomnia, unspecified: Secondary | ICD-10-CM

## 2015-07-05 DIAGNOSIS — F329 Major depressive disorder, single episode, unspecified: Secondary | ICD-10-CM

## 2015-07-05 DIAGNOSIS — I10 Essential (primary) hypertension: Secondary | ICD-10-CM

## 2015-07-05 DIAGNOSIS — F0150 Vascular dementia without behavioral disturbance: Secondary | ICD-10-CM

## 2015-07-05 DIAGNOSIS — F0280 Dementia in other diseases classified elsewhere without behavioral disturbance: Secondary | ICD-10-CM

## 2015-07-05 DIAGNOSIS — M8580 Other specified disorders of bone density and structure, unspecified site: Secondary | ICD-10-CM | POA: Insufficient documentation

## 2015-07-05 NOTE — Progress Notes (Signed)
Patient ID: [name], male   DOB: Jun 30, 1932, 81 y.o.   MRN: [id number]      [facility]  PCP: Blanchie Serve, MD  Code Status: DNR  Allergies  Allergen Reactions  . Codeine     REACTION: Rash,itching    Chief Complaint  Patient presents with  . Medical Management of Chronic Issues     HPI:  81 year old patient is here for long term care and is seen today for routine visit. He has advanced dementia and is unable to participate in HPI and ROS. He has medical history of HTN, constipation, allergic rhinitis among others. He is sitting on his geri chair in the hallway. He is under total care  Review of Systems:  Unable to obtain    Past Medical History  Diagnosis Date  . Anxiety state, unspecified   . Arthritis     fingers/shoulders  . Unspecified cataract   . Colon polyps     last colonoscopy 2010  . CAD (coronary artery disease) 2006    s/p CABG  . Senile dementia, uncomplicated   . Depression   . HLD (hyperlipidemia)   . Gastric ulcer, unspecified as acute or chronic, without mention of hemorrhage, perforation, or obstruction   . Hypertension   . Insomnia   . TIA (transient ischemic attack)   . Clostridium difficile infection 2012    recurrent-last treatment 10/2010  . BPH (benign prostatic hypertrophy)   . H/O: hypothyroidism   . History of chronic pancreatitis   . Alcohol abuse, in remission     sober x years  . Paroxysmal a-fib     history of  . Hx: UTI (urinary tract infection)   . Diabetes mellitus type II     history of  . ARF (acute renal failure)     history of  . History of chicken pox   . Pulmonary nodule/lesion, solitary 05/2010    82mm L lung base nodule, if high risk rec f/u with CT 1 yr, if low risk, no f/u needed  . Muscle weakness (generalized)   . Lack of coordination   . Psychosis   . Episodic mood disorder   . Extrapyramidal disorder   . Altered mental status   . ALCOHOL ABUSE, HX OF 12/24/2006    Qualifier:  Diagnosis of  By: [name]  MD, [name]    . COLON POLYP 12/24/2006    Qualifier: Diagnosis of  By: [name]    . Oral lesion 02/27/2011  . Subdural hematoma, post-traumatic 08/04/2013  . Protein-calorie malnutrition, severe 08/05/2013   Medications: Patient's Medications  New Prescriptions   No medications on file  Previous Medications   ACETAMINOPHEN (TYLENOL) 325 MG TABLET    Take 650 mg by mouth every 8 (eight) hours as needed for mild pain or fever (Toe pain).    CHOLECALCIFEROL (VITAMIN D) 1000 UNITS TABLET    Take 1,000 Units by mouth daily.   LORATADINE (CLARITIN) 10 MG TABLET    Take 10 mg by mouth daily as needed.    MELATONIN 3 MG TABS    Take 3 mg by mouth at bedtime.   MIRTAZAPINE (REMERON) 7.5 MG TABLET    Take 7.5 mg by mouth at bedtime.   MULTIPLE VITAMINS-MINERALS (CERTAGEN PO)    Take 1 tablet by mouth daily.   SERTRALINE (ZOLOFT) 100 MG TABLET    Take 100 mg by mouth daily. For depression/alzheimers vascular dementia  Modified Medications   No  medications on file  Discontinued Medications   No medications on file     Physical Exam Filed Vitals:   07/05/15 1602  BP: 176/63  Pulse: 70  Temp: 97.9 F (36.6 C)  Resp: 16  SpO2: 91%   Wt Readings from Last 3 Encounters:  06/04/15 137 lb (62.143 kg)  04/05/15 138 lb 12.8 oz (62.959 kg)  02/28/15 135 lb (61.236 kg)   General- 81 elderly frail male, in no acute distress Head- normocephalic, atraumatic Nose- normal nasal mucosa, no maxillary or frontal sinus tenderness, no nasal discharge Throat- moist mucus membrane Neck- no cervical lymphadenopathy, no supraclavicular lymphadenopathy, no thyromegaly, no jugular vein distension Cardiovascular- normal s1,s2, no murmurs, palpable dorsalis pedis and radial pulses, no leg edema Respiratory- bilateral clear to auscultation, no wheeze, no rhonchi, no crackles, no use of accessory muscles Abdomen- bowel sounds present, soft, non tender Musculoskeletal- able to  move all 4 extremities, no joint redness or tenderness Neurological- arousable with baseline confusion Skin- warm and dry Psychiatry- calm and confused    Labs reviewed: CBC    Component Value Date/Time   WBC 6.4 02/20/2015   WBC 9.1 08/01/2014 0543   WBC 16.3* 03/08/2012 0311   RBC 3.75* 08/01/2014 1205   RBC 3.39* 08/01/2014 0543   RBC 4.44 03/08/2012 0311   HGB 15.0 02/20/2015   HGB 13.4 03/08/2012 0311   HCT 44 02/20/2015   HCT 40.2 03/08/2012 0311   HCT 46 05/30/2010   PLT 175 02/20/2015   PLT 128* 03/08/2012 0311   PLT 177 05/30/2010   MCV 87.9 08/01/2014 0543   MCV 90 03/08/2012 0311   MCV 86.1 05/30/2010   MCH 29.2 08/01/2014 0543   MCH 30.3 03/08/2012 0311   MCHC 33.2 08/01/2014 0543   MCHC 33.5 03/08/2012 0311   RDW 14.0 08/01/2014 0543   RDW 13.5 03/08/2012 0311   LYMPHSABS 0.7 07/29/2014 1700   LYMPHSABS 1.7 03/08/2012 0311   MONOABS 1.7* 07/29/2014 1700   MONOABS 1.7* 03/08/2012 0311   EOSABS 0.0 07/29/2014 1700   EOSABS 0.0 03/08/2012 0311   BASOSABS 0.0 07/29/2014 1700   BASOSABS 0.0 03/08/2012 0311   CMP Latest Ref Rng 02/05/2015 08/29/2014 08/01/2014  Glucose 70 - 99 mg/dL - - 147(H)  BUN 4 - 21 mg/dL 18 17 22   Creatinine 0.6 - 1.3 mg/dL 0.9 0.9 1.14  Sodium 137 - 147 mmol/L 143 138 140  Potassium 3.4 - 5.3 mmol/L 4.3 4.2 4.2  Chloride 96 - 112 mEq/L - - 106  CO2 19 - 32 mEq/L - - 22  Calcium 8.4 - 10.5 mg/dL - - 7.9(L)  Total Protein 6.0 - 8.3 g/dL - - -  Albumin - - - -  Total Bilirubin 0.3 - 1.2 mg/dL - - -  Alkaline Phos 25 - 125 U/L - 201(A) -  AST 14 - 40 U/L - 16 -  ALT 10 - 40 U/L - 10 -    Assessment/Plan  Senile osteopenia Noted on xray. Continue vitamin d supplement and add calcium 1200 mg daily  HTN Elevated bp will need to monitor bp daily for a week, then 3 days a week and review bp reading. consider adding antihypertensive if bp >150/90  Protein-calorie malnutrition, severe Weight stable. Monitor weight. Decline  anticipated with advanced dementia. Continue mechanical soft diet with fortified foods. Continue MVI. Continue remeron 7.5 mg daily  Depression Stable, continue sertraline 100 mg daily and monitor  Dysphagia Continue emchanical soft diet with thin liquid, tolerating well, assistance  with feeding. Aspiration precautions  Allergic rhinitis Stable . D/c claritin  Mixed Alzheimer's and vascular dementia Advanced. Further declined anticipated. Continue total assist with ADLs with fall risk and pressure ulcer precautions. Monitor for change in behavior  Insomnia Sleeping during daytime. D/c melatonin and monitor   Blanchie Serve, MD  [facility] [phone] (Monday-Friday 8 am - 5 pm) [phone] (afterhours)

## 2015-08-15 ENCOUNTER — Non-Acute Institutional Stay (SKILLED_NURSING_FACILITY): Payer: Medicare Other | Admitting: Nurse Practitioner

## 2015-08-15 DIAGNOSIS — M899 Disorder of bone, unspecified: Secondary | ICD-10-CM | POA: Diagnosis not present

## 2015-08-15 DIAGNOSIS — G4700 Insomnia, unspecified: Secondary | ICD-10-CM

## 2015-08-15 DIAGNOSIS — F0150 Vascular dementia without behavioral disturbance: Secondary | ICD-10-CM

## 2015-08-15 DIAGNOSIS — F0280 Dementia in other diseases classified elsewhere without behavioral disturbance: Secondary | ICD-10-CM | POA: Diagnosis not present

## 2015-08-15 DIAGNOSIS — E43 Unspecified severe protein-calorie malnutrition: Secondary | ICD-10-CM | POA: Diagnosis not present

## 2015-08-15 DIAGNOSIS — G309 Alzheimer's disease, unspecified: Secondary | ICD-10-CM | POA: Diagnosis not present

## 2015-08-15 DIAGNOSIS — M8580 Other specified disorders of bone density and structure, unspecified site: Secondary | ICD-10-CM

## 2015-08-15 DIAGNOSIS — I10 Essential (primary) hypertension: Secondary | ICD-10-CM | POA: Diagnosis not present

## 2015-08-15 NOTE — Progress Notes (Signed)
Patient ID: [name], male   DOB: July 02, 1932, 81 y.o.   MRN: [id number]    Nursing Home Location:  [place] of Service: SNF (31)  PCP: Blanchie Serve, MD  Allergies  Allergen Reactions  . Codeine     REACTION: Rash,itching    Chief Complaint  Patient presents with  . Medical Management of Chronic Issues    HPI:  Patient is a 81 y.o. male seen today at [facility] for medical management of his chronic issues.  He has a past medical history CAD, CABG, HLD, HTN, arthritis, DM, and advanced dementia.  He is totally dependent on staff for all ADL's.  He is sitting in his geri chair in the hallway, as he does most days.  He does not respond to questions, this is his baseline. Nursing without any acute concerns. Weight has been stable, eating well with staff. Staff without concerns of constipation. No signs of pain.   Review of Systems: Unable to attain due to advanced dementia.  Review of Systems  Past Medical History  Diagnosis Date  . Anxiety state, unspecified   . Arthritis     fingers/shoulders  . Unspecified cataract   . Colon polyps     last colonoscopy 2010  . CAD (coronary artery disease) 2006    s/p CABG  . Senile dementia, uncomplicated   . Depression   . HLD (hyperlipidemia)   . Gastric ulcer, unspecified as acute or chronic, without mention of hemorrhage, perforation, or obstruction   . Hypertension   . Insomnia   . TIA (transient ischemic attack)   . Clostridium difficile infection 2012    recurrent-last treatment 10/2010  . BPH (benign prostatic hypertrophy)   . H/O: hypothyroidism   . History of chronic pancreatitis   . Alcohol abuse, in remission     sober x years  . Paroxysmal a-fib     history of  . Hx: UTI (urinary tract infection)   . Diabetes mellitus type II     history of  . ARF (acute renal failure)     history of  . History of chicken pox   . Pulmonary nodule/lesion, solitary 05/2010   15mm L lung base nodule, if high risk rec f/u with CT 1 yr, if low risk, no f/u needed  . Muscle weakness (generalized)   . Lack of coordination   . Psychosis   . Episodic mood disorder   . Extrapyramidal disorder   . Altered mental status   . ALCOHOL ABUSE, HX OF 12/24/2006    Qualifier: Diagnosis of  By: [name]  MD, [name]    . COLON POLYP 12/24/2006    Qualifier: Diagnosis of  By: [name]    . Oral lesion 02/27/2011  . Subdural hematoma, post-traumatic 08/04/2013  . Protein-calorie malnutrition, severe 08/05/2013   Past Surgical History  Procedure Laterality Date  . Coronary artery bypass graft  2006  . Injection knee  09/06/2001  . Cardiovascular stress test  05/2008    small regions of perfusion abnormality suggestive of diaphragm artifact, exercise cap 7 METs  . Hip arthroplasty Left 07/30/2014    Procedure: ARTHROPLASTY BIPOLAR HIP;  Surgeon: Johnn Hai, MD;  Location: [place];  Service: Orthopedics;  Laterality: Left;   Social History:   reports that he quit smoking about 9 years ago. He does not have any smokeless tobacco history on file. He reports that he does not drink alcohol  or use illicit drugs.  Family History  Problem Relation Age of Onset  . Dementia Father     ? Alzheimers  . Heart attack Father   . Hypertension Brother   . Diabetes Brother     Medications: Patient's Medications  New Prescriptions   No medications on file  Previous Medications   ACETAMINOPHEN (TYLENOL) 325 MG TABLET    Take 650 mg by mouth every 8 (eight) hours as needed for mild pain or fever (Toe pain).    CHOLECALCIFEROL (VITAMIN D) 1000 UNITS TABLET    Take 1,000 Units by mouth daily.   LORATADINE (CLARITIN) 10 MG TABLET    Take 10 mg by mouth daily as needed.    MELATONIN 3 MG TABS    Take 3 mg by mouth at bedtime.   MIRTAZAPINE (REMERON) 7.5 MG TABLET    Take 7.5 mg by mouth at bedtime.   MULTIPLE VITAMINS-MINERALS (CERTAGEN PO)    Take 1 tablet by mouth daily.    SERTRALINE (ZOLOFT) 100 MG TABLET    Take 100 mg by mouth daily. For depression/alzheimers vascular dementia  Modified Medications   No medications on file  Discontinued Medications   No medications on file     Physical Exam: Filed Vitals:   08/15/15 1143  BP: 116/56  Pulse: 58  Temp: 97.6 F (36.4 C)  TempSrc: Oral  Resp: 18  Weight: 138 lb (62.596 kg)    Physical Exam  Constitutional: He appears lethargic.  Frail elderly male.   HENT:  Head: Normocephalic and atraumatic.  Mouth/Throat: Oropharynx is clear and moist. No oropharyngeal exudate.  Eyes: Pupils are equal, round, and reactive to light.  Neck: Normal range of motion. Neck supple.  Cardiovascular: Normal rate, regular rhythm, normal heart sounds and intact distal pulses.   No murmur heard. Pulmonary/Chest: Effort normal and breath sounds normal.  Abdominal: Soft. Bowel sounds are normal. He exhibits no distension.  Musculoskeletal: He exhibits no edema or tenderness.  Limited range of motion in all extremities.   Neurological: He appears lethargic.  Skin: Skin is warm and dry.    Labs reviewed: Basic Metabolic Panel:  Recent Labs  08/29/14 02/05/15  NA 138 143  K 4.2 4.3  BUN 17 18  CREATININE 0.9 0.9   Liver Function Tests:  Recent Labs  08/29/14  AST 16  ALT 10  ALKPHOS 201*   No results for input(s): LIPASE, AMYLASE in the last 8760 hours. No results for input(s): AMMONIA in the last 8760 hours. CBC:  Recent Labs  08/29/14 02/20/15  WBC 9.4 6.4  HGB 13.6 15.0  HCT 43 44  PLT 171 175   TSH:  Recent Labs  12/06/14  TSH 2.04   A1C: Lab Results  Component Value Date   HGBA1C 5.8 09/06/2014   Lipid Panel: No results for input(s): CHOL, HDL, LDLCALC, TRIG, CHOLHDL, LDLDIRECT in the last 8760 hours.   Assessment/Plan 1. Essential hypertension, benign Stable.  Patient's review of blood pressures shows SBP readings in the 100-115 range.  No medications needed at this time.   2.  Senile osteopenia Stable.  Continue vitamin D and calcium supplement. No recent falls.   3. Protein-calorie malnutrition, severe ([place]) Improved. Patient has gained a pound in the last month. conts on remeron 7.5 mg daily.   No weight loss.  Continue with fortified meals.   4. Mixed Alzheimer's and vascular dementia Stable.  Relies on staff for all ADL's.  Expect further decline.  5. Insomnia Stable.  Continues to sleep mostly during the day.  Melatonin discontinued last month.       Carlos American. Harle Battiest  [facility] [phone] 8 am - 5 pm) [phone] (after hours)

## 2015-09-17 ENCOUNTER — Encounter: Payer: Self-pay | Admitting: Nurse Practitioner

## 2015-09-17 ENCOUNTER — Non-Acute Institutional Stay (SKILLED_NURSING_FACILITY): Payer: Medicare Other | Admitting: Nurse Practitioner

## 2015-09-17 DIAGNOSIS — E43 Unspecified severe protein-calorie malnutrition: Secondary | ICD-10-CM

## 2015-09-17 DIAGNOSIS — F0150 Vascular dementia without behavioral disturbance: Secondary | ICD-10-CM | POA: Diagnosis not present

## 2015-09-17 DIAGNOSIS — F0280 Dementia in other diseases classified elsewhere without behavioral disturbance: Secondary | ICD-10-CM

## 2015-09-17 DIAGNOSIS — F329 Major depressive disorder, single episode, unspecified: Secondary | ICD-10-CM | POA: Diagnosis not present

## 2015-09-17 DIAGNOSIS — F32A Depression, unspecified: Secondary | ICD-10-CM

## 2015-09-17 DIAGNOSIS — M899 Disorder of bone, unspecified: Secondary | ICD-10-CM | POA: Diagnosis not present

## 2015-09-17 DIAGNOSIS — G309 Alzheimer's disease, unspecified: Secondary | ICD-10-CM

## 2015-09-17 DIAGNOSIS — M8580 Other specified disorders of bone density and structure, unspecified site: Secondary | ICD-10-CM

## 2015-09-17 DIAGNOSIS — N400 Enlarged prostate without lower urinary tract symptoms: Secondary | ICD-10-CM | POA: Diagnosis not present

## 2015-09-17 NOTE — Progress Notes (Signed)
Patient ID: [name], male   DOB: Apr 19, 1932, 81 y.o.   MRN: [id number]    Nursing Home Location:  [place] of Service: SNF (31)  PCP: Blanchie Serve, MD  Allergies  Allergen Reactions  . Codeine     REACTION: Rash,itching    Chief Complaint  Patient presents with  . Medical Management of Chronic Issues    Routine Visit     HPI:  Patient is a 81 y.o. male seen today at [facility] for medical management of his chronic issues. He has a past medical history CAD, CABG, HLD, HTN, arthritis, DM, and advanced dementia. Pt has not had any acute issues or illness in the last month. Cont to eat well with staff feeding him. Pt is dependant of staff for all ADLs. No signs of pain.  Nursing without any acute concerns.  Review of Systems:  Review of Systems  Unable to perform ROS: Dementia    Past Medical History  Diagnosis Date  . Anxiety state, unspecified   . Arthritis     fingers/shoulders  . Unspecified cataract   . Colon polyps     last colonoscopy 2010  . CAD (coronary artery disease) 2006    s/p CABG  . Senile dementia, uncomplicated   . Depression   . HLD (hyperlipidemia)   . Gastric ulcer, unspecified as acute or chronic, without mention of hemorrhage, perforation, or obstruction   . Hypertension   . Insomnia   . TIA (transient ischemic attack)   . Clostridium difficile infection 2012    recurrent-last treatment 10/2010  . BPH (benign prostatic hypertrophy)   . H/O: hypothyroidism   . History of chronic pancreatitis   . Alcohol abuse, in remission     sober x years  . Paroxysmal a-fib (HCC)     history of  . Hx: UTI (urinary tract infection)   . Diabetes mellitus type II     history of  . ARF (acute renal failure) (HCC)     history of  . History of chicken pox   . Pulmonary nodule/lesion, solitary 05/2010    42mm L lung base nodule, if high risk rec f/u with CT 1 yr, if low risk, no f/u needed  . Muscle  weakness (generalized)   . Lack of coordination   . Psychosis   . Episodic mood disorder ([place])   . Extrapyramidal disorder   . Altered mental status   . ALCOHOL ABUSE, HX OF 12/24/2006    Qualifier: Diagnosis of  By: [name]  MD, [name]    . COLON POLYP 12/24/2006    Qualifier: Diagnosis of  By: [name]    . Oral lesion 02/27/2011  . Subdural hematoma, post-traumatic ([place]) 08/04/2013  . Protein-calorie malnutrition, severe ([place]) 08/05/2013   Past Surgical History  Procedure Laterality Date  . Coronary artery bypass graft  2006  . Injection knee  09/06/2001  . Cardiovascular stress test  05/2008    small regions of perfusion abnormality suggestive of diaphragm artifact, exercise cap 7 METs  . Hip arthroplasty Left 07/30/2014    Procedure: ARTHROPLASTY BIPOLAR HIP;  Surgeon: Johnn Hai, MD;  Location: [place];  Service: Orthopedics;  Laterality: Left;   Social History:   reports that he quit smoking about 9 years ago. He does not have any smokeless tobacco history on file. He reports that he does not drink alcohol or use illicit drugs.  Family History  Problem Relation Age of Onset  . Dementia Father     ? Alzheimers  . Heart attack Father   . Hypertension Brother   . Diabetes Brother     Medications: Patient's Medications  New Prescriptions   No medications on file  Previous Medications   ACETAMINOPHEN (TYLENOL) 500 MG TABLET    Take 1,000 mg by mouth every 8 (eight) hours as needed (toe pain).   CALCIUM CARBONATE (CALCIUM 600 PO)    2 by mouth daily for osteopenia   CHOLECALCIFEROL (VITAMIN D) 1000 UNITS TABLET    Take 1,000 Units by mouth daily.   MIRTAZAPINE (REMERON) 7.5 MG TABLET    Take 7.5 mg by mouth at bedtime.   MULTIPLE VITAMINS-MINERALS (CERTAGEN PO)    Take 1 tablet by mouth daily.   SERTRALINE (ZOLOFT) 100 MG TABLET    Take 100 mg by mouth daily. For depression/alzheimers vascular dementia  Modified Medications   No medications on file    Discontinued Medications   ACETAMINOPHEN (TYLENOL) 325 MG TABLET    Take 650 mg by mouth every 8 (eight) hours as needed for mild pain or fever (Toe pain).      Physical Exam: Filed Vitals:   09/17/15 1540  BP: 115/90  Pulse: 88  Temp: 97.5 F (36.4 C)  TempSrc: Oral  Resp: 20  Height: 6\' 1"  (1.854 m)  Weight: 137 lb (62.143 kg)  SpO2: 96%    Physical Exam  Constitutional:  Frail elderly male.   HENT:  Head: Normocephalic and atraumatic.  Mouth/Throat: Oropharynx is clear and moist. No oropharyngeal exudate.  Eyes: Pupils are equal, round, and reactive to light.  Neck: Normal range of motion. Neck supple.  Cardiovascular: Normal rate, regular rhythm, normal heart sounds and intact distal pulses.   No murmur heard. Pulmonary/Chest: Effort normal and breath sounds normal.  Abdominal: Soft. Bowel sounds are normal. He exhibits no distension.  Musculoskeletal: He exhibits no edema or tenderness.  Neurological: He is alert.  Skin: Skin is warm and dry.  Psychiatric: Cognition and memory are impaired. He exhibits abnormal recent memory and abnormal remote memory.    Labs reviewed: Basic Metabolic Panel:  Recent Labs  02/05/15  NA 143  K 4.3  BUN 18  CREATININE 0.9   Liver Function Tests: No results for input(s): AST, ALT, ALKPHOS, BILITOT, PROT, ALBUMIN in the last 8760 hours. No results for input(s): LIPASE, AMYLASE in the last 8760 hours. No results for input(s): AMMONIA in the last 8760 hours. CBC:  Recent Labs  02/20/15  WBC 6.4  HGB 15.0  HCT 44  PLT 175   TSH:  Recent Labs  12/06/14  TSH 2.04   A1C: Lab Results  Component Value Date   HGBA1C 5.8 09/06/2014   Lipid Panel: No results for input(s): CHOL, HDL, LDLCALC, TRIG, CHOLHDL, LDLDIRECT in the last 8760 hours.   Assessment/Plan 1. Senile osteopenia Stable, conts on calcium and vit D  2. BPH (benign prostatic hyperplasia) Stable, not currently on medications, no noted issues in  voiding  3. Depression -without signs of depression, will decrease zoloft to 50 mg daily in attempt for dose reduction.   4. Protein-calorie malnutrition, severe ([place]) Weight has been stable, pt with good PO intake, will follow up CMP -remains on supplements and remeron 7.5 mg qhs  5. Mixed Alzheimer's and vascular dementia Advanced dementia, no acute changes in cognitive or functional status   Jessica K. Harle Battiest  [facility] [phone]  8 am - 5 pm) [phone] (after hours)

## 2015-09-18 LAB — BASIC METABOLIC PANEL
BUN: 22 mg/dL — AB (ref 4–21)
Creatinine: 1 mg/dL (ref 0.6–1.3)
Glucose: 74 mg/dL
POTASSIUM: 4 mmol/L (ref 3.4–5.3)
SODIUM: 144 mmol/L (ref 137–147)

## 2015-09-18 LAB — HEPATIC FUNCTION PANEL
ALT: 17 U/L (ref 10–40)
AST: 16 U/L (ref 14–40)
Alkaline Phosphatase: 97 U/L (ref 25–125)
BILIRUBIN, TOTAL: 1.1 mg/dL

## 2015-09-18 LAB — CBC AND DIFFERENTIAL
HEMATOCRIT: 48 % (ref 41–53)
HEMOGLOBIN: 15.5 g/dL (ref 13.5–17.5)
Platelets: 165 10*3/uL (ref 150–399)
WBC: 6.9 10*3/mL

## 2015-10-24 ENCOUNTER — Encounter: Payer: Self-pay | Admitting: Nurse Practitioner

## 2015-10-24 ENCOUNTER — Non-Acute Institutional Stay (SKILLED_NURSING_FACILITY): Payer: Medicare Other | Admitting: Nurse Practitioner

## 2015-10-24 DIAGNOSIS — F32A Depression, unspecified: Secondary | ICD-10-CM

## 2015-10-24 DIAGNOSIS — F0280 Dementia in other diseases classified elsewhere without behavioral disturbance: Secondary | ICD-10-CM

## 2015-10-24 DIAGNOSIS — M8580 Other specified disorders of bone density and structure, unspecified site: Secondary | ICD-10-CM

## 2015-10-24 DIAGNOSIS — M899 Disorder of bone, unspecified: Secondary | ICD-10-CM | POA: Diagnosis not present

## 2015-10-24 DIAGNOSIS — M1990 Unspecified osteoarthritis, unspecified site: Secondary | ICD-10-CM | POA: Diagnosis not present

## 2015-10-24 DIAGNOSIS — F0150 Vascular dementia without behavioral disturbance: Secondary | ICD-10-CM | POA: Diagnosis not present

## 2015-10-24 DIAGNOSIS — F329 Major depressive disorder, single episode, unspecified: Secondary | ICD-10-CM | POA: Diagnosis not present

## 2015-10-24 DIAGNOSIS — E43 Unspecified severe protein-calorie malnutrition: Secondary | ICD-10-CM | POA: Diagnosis not present

## 2015-10-24 DIAGNOSIS — G309 Alzheimer's disease, unspecified: Secondary | ICD-10-CM | POA: Diagnosis not present

## 2015-10-24 NOTE — Progress Notes (Signed)
Patient ID: [name], male   DOB: [birth year], 81 y.o.   MRN: [id number]    Nursing Home Location:  [place] of Service: SNF (31)  PCP: Blanchie Serve, MD  Allergies  Allergen Reactions  . Codeine     REACTION: Rash,itching    Chief Complaint  Patient presents with  . Medical Management of Chronic Issues    Routine Visit     HPI:  Patient is a 81 y.o. male seen today at [facility] for medical management of chronic issues. He has a past medical history CAD, CABG, HLD, HTN, arthritis, DM, and advanced dementia. Pt has not had any acute issues or illness in the last month. Pt in bed at time of visit. Pt non-verval due to advanced dementia. Pt does not slow any signs of pain. zoloft decrease to 50 mg last month and tolerating well. Nursing does not have concerns at this time.   Review of Systems:  Review of Systems  Unable to perform ROS: Dementia    Past Medical History  Diagnosis Date  . Anxiety state, unspecified   . Arthritis     fingers/shoulders  . Unspecified cataract   . Colon polyps     last colonoscopy 2010  . CAD (coronary artery disease) 2006    s/p CABG  . Senile dementia, uncomplicated   . Depression   . HLD (hyperlipidemia)   . Gastric ulcer, unspecified as acute or chronic, without mention of hemorrhage, perforation, or obstruction   . Hypertension   . Insomnia   . TIA (transient ischemic attack)   . Clostridium difficile infection 2012    recurrent-last treatment 10/2010  . BPH (benign prostatic hypertrophy)   . H/O: hypothyroidism   . History of chronic pancreatitis   . Alcohol abuse, in remission     sober x years  . Paroxysmal a-fib (HCC)     history of  . Hx: UTI (urinary tract infection)   . Diabetes mellitus type II     history of  . ARF (acute renal failure) (HCC)     history of  . History of chicken pox   . Pulmonary nodule/lesion, solitary 05/2010    68mm L lung base nodule, if high  risk rec f/u with CT 1 yr, if low risk, no f/u needed  . Muscle weakness (generalized)   . Lack of coordination   . Psychosis   . Episodic mood disorder ([place])   . Extrapyramidal disorder   . Altered mental status   . ALCOHOL ABUSE, HX OF 12/24/2006    Qualifier: Diagnosis of  By: [name]  MD, [name]    . COLON POLYP 12/24/2006    Qualifier: Diagnosis of  By: [name]    . Oral lesion 02/27/2011  . Subdural hematoma, post-traumatic ([place]) 08/04/2013  . Protein-calorie malnutrition, severe ([place]) 08/05/2013   Past Surgical History  Procedure Laterality Date  . Coronary artery bypass graft  2006  . Injection knee  09/06/2001  . Cardiovascular stress test  05/2008    small regions of perfusion abnormality suggestive of diaphragm artifact, exercise cap 7 METs  . Hip arthroplasty Left 07/30/2014    Procedure: ARTHROPLASTY BIPOLAR HIP;  Surgeon: Johnn Hai, MD;  Location: [place];  Service: Orthopedics;  Laterality: Left;   Social History:   reports that he quit smoking about 9 years ago. He does not have any smokeless tobacco history on file. He reports that  he does not drink alcohol or use illicit drugs.  Family History  Problem Relation Age of Onset  . Dementia Father     ? Alzheimers  . Heart attack Father   . Hypertension Brother   . Diabetes Brother     Medications: Patient's Medications  New Prescriptions   No medications on file  Previous Medications   ACETAMINOPHEN (TYLENOL) 500 MG TABLET    Take 1,000 mg by mouth every 8 (eight) hours as needed (toe pain).   CALCIUM CARBONATE (CALCIUM 600 PO)    2 by mouth daily for osteopenia   CHOLECALCIFEROL (VITAMIN D) 1000 UNITS TABLET    Take 1,000 Units by mouth daily.   MIRTAZAPINE (REMERON) 7.5 MG TABLET    Take 7.5 mg by mouth at bedtime.   MULTIPLE VITAMINS-MINERALS (CERTAGEN PO)    Take 1 tablet by mouth daily.   SERTRALINE (ZOLOFT) 50 MG TABLET    Take 50 mg by mouth daily.  Modified Medications   No medications on  file  Discontinued Medications   SERTRALINE (ZOLOFT) 100 MG TABLET    Take 100 mg by mouth daily. For depression/alzheimers vascular dementia     Physical Exam: Filed Vitals:   10/24/15 1431  BP: 105/72  Pulse: 65  Temp: 96.6 F (35.9 C)  TempSrc: Oral  Resp: 18  Height: 6\' 1"  (1.854 m)  Weight: 128 lb 12.8 oz (58.423 kg)    Physical Exam  Constitutional:  Frail elderly male.   HENT:  Head: Normocephalic and atraumatic.  Eyes: Pupils are equal, round, and reactive to light.  Neck: Normal range of motion. Neck supple.  Cardiovascular: Normal rate, regular rhythm and normal heart sounds.   No murmur heard. Pulmonary/Chest: Effort normal and breath sounds normal.  Abdominal: Soft. Bowel sounds are normal. He exhibits no distension.  Musculoskeletal: He exhibits no edema or tenderness.  Neurological: He is alert.  Skin: Skin is warm and dry.  Psychiatric: Cognition and memory are impaired. He exhibits abnormal recent memory and abnormal remote memory.    Labs reviewed: Basic Metabolic Panel:  Recent Labs  02/05/15 09/18/15  NA 143 144  K 4.3 4.0  BUN 18 22*  CREATININE 0.9 1.0   Liver Function Tests:  Recent Labs  09/18/15  AST 16  ALT 17  ALKPHOS 97   No results for input(s): LIPASE, AMYLASE in the last 8760 hours. No results for input(s): AMMONIA in the last 8760 hours. CBC:  Recent Labs  02/20/15 09/18/15  WBC 6.4 6.9  HGB 15.0 15.5  HCT 44 48  PLT 175 165   TSH:  Recent Labs  12/06/14  TSH 2.04   A1C: Lab Results  Component Value Date   HGBA1C 5.8 09/06/2014   Lipid Panel: No results for input(s): CHOL, HDL, LDLCALC, TRIG, CHOLHDL, LDLDIRECT in the last 8760 hours.    Assessment/Plan 1. Protein-calorie malnutrition, severe (HCC) Weight loss noted in the last month, anticipated due to advanced dementia despite assistance from staff, mechanical soft diet with fortified foods, MVI, and remeron 7.5 mg daily Will monitor and staff to cont  to provide supportive care.   2. Mixed Alzheimer's and vascular dementia Advanced dementia, pt has lost weight in the last month. conts on Remeron for appetite but anticipate further decline due to advanced nature of disease. Staff to cont to feed pt and provide supportive care. Providing all care  3. Senile osteopenia -stable, conts on vit d and calcium supplement   4. Depression Without signs  of worsening depression, conts on zoloft 50 mg daily   5. Osteoarthritis, unspecified osteoarthritis type, unspecified site Stable, no signs of pain. conts tylenol as needed  Harith Mccadden K. Harle Battiest  [facility] [phone] 8 am - 5 pm) [phone] (after hours)

## 2015-11-21 ENCOUNTER — Encounter: Payer: Self-pay | Admitting: Nurse Practitioner

## 2015-11-21 ENCOUNTER — Non-Acute Institutional Stay (SKILLED_NURSING_FACILITY): Payer: Medicare Other | Admitting: Nurse Practitioner

## 2015-11-21 DIAGNOSIS — M899 Disorder of bone, unspecified: Secondary | ICD-10-CM

## 2015-11-21 DIAGNOSIS — E559 Vitamin D deficiency, unspecified: Secondary | ICD-10-CM

## 2015-11-21 DIAGNOSIS — F0280 Dementia in other diseases classified elsewhere without behavioral disturbance: Secondary | ICD-10-CM

## 2015-11-21 DIAGNOSIS — F32A Depression, unspecified: Secondary | ICD-10-CM

## 2015-11-21 DIAGNOSIS — F0150 Vascular dementia without behavioral disturbance: Secondary | ICD-10-CM | POA: Diagnosis not present

## 2015-11-21 DIAGNOSIS — E43 Unspecified severe protein-calorie malnutrition: Secondary | ICD-10-CM | POA: Diagnosis not present

## 2015-11-21 DIAGNOSIS — G309 Alzheimer's disease, unspecified: Secondary | ICD-10-CM

## 2015-11-21 DIAGNOSIS — M8580 Other specified disorders of bone density and structure, unspecified site: Secondary | ICD-10-CM

## 2015-11-21 DIAGNOSIS — F329 Major depressive disorder, single episode, unspecified: Secondary | ICD-10-CM

## 2015-11-21 NOTE — Progress Notes (Signed)
Patient ID: ILYAAS Madden, male   DOB: 01/21/1932, 82 y.o.   MRN: [id number]    Nursing Home Location:  [place] of Service: SNF (31)  PCP: Blanchie Serve, MD  Allergies  Allergen Reactions  . Codeine     REACTION: Rash,itching    Chief Complaint  Patient presents with  . Medical Management of Chronic Issues    routine Visit    HPI:  Patient is a 82 y.o. male seen today at [facility] for medical management of chronic issues. He has a past medical history CAD, CABG, HLD, HTN, arthritis, DM, and advanced dementia. Pt has been at baseline in the last month. There is no acute issues noted. Nursing reports pt is eating well. Weight has been stable in the last month. No increase in anxiety or depression. No signs of pain noted. Nursing without concerns at this time.   Review of Systems:  Review of Systems  Unable to perform ROS: Dementia    Past Medical History  Diagnosis Date  . Anxiety state, unspecified   . Arthritis     fingers/shoulders  . Unspecified cataract   . Colon polyps     last colonoscopy 2010  . CAD (coronary artery disease) 2006    s/p CABG  . Senile dementia, uncomplicated   . Depression   . HLD (hyperlipidemia)   . Gastric ulcer, unspecified as acute or chronic, without mention of hemorrhage, perforation, or obstruction   . Hypertension   . Insomnia   . TIA (transient ischemic attack)   . Clostridium difficile infection 2012    recurrent-last treatment 10/2010  . BPH (benign prostatic hypertrophy)   . H/O: hypothyroidism   . History of chronic pancreatitis   . Alcohol abuse, in remission     sober x years  . Paroxysmal a-fib (HCC)     history of  . Hx: UTI (urinary tract infection)   . Diabetes mellitus type II     history of  . ARF (acute renal failure) (HCC)     history of  . History of chicken pox   . Pulmonary nodule/lesion, solitary 05/2010    3mm L lung base nodule, if high risk rec f/u with  CT 1 yr, if low risk, no f/u needed  . Muscle weakness (generalized)   . Lack of coordination   . Psychosis   . Episodic mood disorder ([place])   . Extrapyramidal disorder   . Altered mental status   . ALCOHOL ABUSE, HX OF 12/24/2006    Qualifier: Diagnosis of  By: [name]  MD, [name]    . COLON POLYP 12/24/2006    Qualifier: Diagnosis of  By: [name]    . Oral lesion 02/27/2011  . Subdural hematoma, post-traumatic ([place]) 08/04/2013  . Protein-calorie malnutrition, severe ([place]) 08/05/2013   Past Surgical History  Procedure Laterality Date  . Coronary artery bypass graft  2006  . Injection knee  09/06/2001  . Cardiovascular stress test  05/2008    small regions of perfusion abnormality suggestive of diaphragm artifact, exercise cap 7 METs  . Hip arthroplasty Left 07/30/2014    Procedure: ARTHROPLASTY BIPOLAR HIP;  Surgeon: Johnn Hai, MD;  Location: [place];  Service: Orthopedics;  Laterality: Left;   Social History:   reports that he quit smoking about 10 years ago. He does not have any smokeless tobacco history on file. He reports that he does not drink alcohol or use  illicit drugs.  Family History  Problem Relation Age of Onset  . Dementia Father     ? Alzheimers  . Heart attack Father   . Hypertension Brother   . Diabetes Brother     Medications: Patient's Medications  New Prescriptions   No medications on file  Previous Medications   ACETAMINOPHEN (TYLENOL) 500 MG TABLET    Take 1,000 mg by mouth every 8 (eight) hours as needed (toe pain).   CALCIUM CARBONATE (CALCIUM 600 PO)    2 by mouth daily for osteopenia   CHOLECALCIFEROL (VITAMIN D) 1000 UNITS TABLET    Take 1,000 Units by mouth daily.   MIRTAZAPINE (REMERON) 7.5 MG TABLET    Take 7.5 mg by mouth at bedtime.   MULTIPLE VITAMINS-MINERALS (CERTAGEN PO)    Take 1 tablet by mouth daily.   SERTRALINE (ZOLOFT) 50 MG TABLET    Take 50 mg by mouth every morning.  Modified Medications   No medications on file    Discontinued Medications   SERTRALINE (ZOLOFT) 50 MG TABLET    Take 50 mg by mouth daily.     Physical Exam: Filed Vitals:   11/21/15 1126  BP: 119/69  Pulse: 71  Temp: 97.9 F (36.6 C)  Resp: 18  Height: 6\' 1"  (1.854 m)  Weight: 128 lb 12.8 oz (58.423 kg)  SpO2: 95%    Physical Exam  Constitutional:  Frail elderly male.   HENT:  Head: Normocephalic and atraumatic.  Eyes: Pupils are equal, round, and reactive to light.  Neck: Normal range of motion. Neck supple.  Cardiovascular: Normal rate, regular rhythm and normal heart sounds.   No murmur heard. Pulmonary/Chest: Effort normal and breath sounds normal.  Abdominal: Soft. Bowel sounds are normal. He exhibits no distension.  Musculoskeletal: He exhibits no edema or tenderness.  Neurological: He is alert.  Skin: Skin is warm and dry.  Psychiatric: Cognition and memory are impaired. He exhibits abnormal recent memory and abnormal remote memory.    Labs reviewed: Basic Metabolic Panel:  Recent Labs  02/05/15 09/18/15  NA 143 144  K 4.3 4.0  BUN 18 22*  CREATININE 0.9 1.0   Liver Function Tests:  Recent Labs  09/18/15  AST 16  ALT 17  ALKPHOS 97   No results for input(s): LIPASE, AMYLASE in the last 8760 hours. No results for input(s): AMMONIA in the last 8760 hours. CBC:  Recent Labs  02/20/15 09/18/15  WBC 6.4 6.9  HGB 15.0 15.5  HCT 44 48  PLT 175 165   TSH:  Recent Labs  12/06/14  TSH 2.04   A1C: Lab Results  Component Value Date   HGBA1C 5.8 09/06/2014   Lipid Panel: No results for input(s): CHOL, HDL, LDLCALC, TRIG, CHOLHDL, LDLDIRECT in the last 8760 hours.    Assessment/Plan 1. Protein-calorie malnutrition, severe ([place]) - weight has been stable in the last month. Pt conts on remeron 7.5 mg daily for appetite, fortified foods and MVI. Will monitor  2. Depression Stable, no signs of changes in mood. conts on zoloft.   3. Senile osteopenia Stable, maintained on calcium and vit  D   4. Mixed Alzheimer's and vascular dementia Advanced disease. Has remained stable in the last month. No acute changes or decline noted. conts to need total care by staff  5. Vitamin D deficiency -conts on Vit D supplement.    Carlos American. Harle Battiest  [facility] [phone] 8 am - 5 pm) [phone] (after  hours)

## 2015-12-20 ENCOUNTER — Non-Acute Institutional Stay (SKILLED_NURSING_FACILITY): Payer: Medicare Other | Admitting: Family

## 2015-12-20 DIAGNOSIS — F0391 Unspecified dementia with behavioral disturbance: Secondary | ICD-10-CM | POA: Diagnosis not present

## 2015-12-20 DIAGNOSIS — M899 Disorder of bone, unspecified: Secondary | ICD-10-CM | POA: Diagnosis not present

## 2015-12-20 DIAGNOSIS — M8580 Other specified disorders of bone density and structure, unspecified site: Secondary | ICD-10-CM

## 2015-12-20 DIAGNOSIS — F0280 Dementia in other diseases classified elsewhere without behavioral disturbance: Secondary | ICD-10-CM | POA: Diagnosis not present

## 2015-12-20 DIAGNOSIS — I10 Essential (primary) hypertension: Secondary | ICD-10-CM

## 2015-12-20 DIAGNOSIS — F03918 Unspecified dementia, unspecified severity, with other behavioral disturbance: Secondary | ICD-10-CM

## 2015-12-20 DIAGNOSIS — F329 Major depressive disorder, single episode, unspecified: Secondary | ICD-10-CM

## 2015-12-20 DIAGNOSIS — F0393 Unspecified dementia, unspecified severity, with mood disturbance: Secondary | ICD-10-CM

## 2015-12-20 NOTE — Progress Notes (Signed)
Patient ID: [name], male   DOB: 03-02-1932, 82 y.o.   MRN: [id number]  Location:  [place]:  SNF (31) Provider:  Blanchie Serve, MD  Patient Care Team: Blanchie Serve, MD as PCP - General (Internal Medicine) Gerlene Fee, NP as Nurse Practitioner (Nurse Practitioner)  Extended Emergency Contact Information Primary Emergency Contact: Overman,Jackie Address: [address]          [place], [place] [address] Johnnette Litter of [place] Phone: [phone] Mobile Phone: [phone] Relation: Daughter Secondary Emergency Contact: Davis,Shirley Address: [address]          [place],  [address] Johnnette Litter of [place] Phone: [phone] Work Phone: [phone] Mobile Phone: [phone] Relation: Other  Code Status:  DNR Goals of care: Advanced Directive information Advanced Directives 11/21/2015  Does patient have an advance directive? Yes  Type of Advance Directive Out of facility DNR (pink MOST or yellow form)  Does patient want to make changes to advanced directive? No - Patient declined  Copy of advanced directive(s) in chart? Yes     Chief Complaint  Patient presents with  . Medical Management of Chronic Issues    HPI:  Pt is a 82 y.o. male seen today at [facility]  for medical management of chronic diseases. He has medical history CAD, CABG, HLD, HTN, arthritis, DM, and advanced dementia.Patient's history is limited due to presence of advance dementia. Facility Nurse states patient's Radford Pax is at baseline. No new behavioral issues.He has good appetite and no signs of pain.     Past Medical History  Diagnosis Date  . Anxiety state, unspecified   . Arthritis     fingers/shoulders  . Unspecified cataract   . Colon polyps     last colonoscopy 2010  . CAD (coronary artery disease) 2006    s/p CABG  . Senile dementia, uncomplicated   . Depression   . HLD (hyperlipidemia)   .  Gastric ulcer, unspecified as acute or chronic, without mention of hemorrhage, perforation, or obstruction   . Hypertension   . Insomnia   . TIA (transient ischemic attack)   . Clostridium difficile infection 2012    recurrent-last treatment 10/2010  . BPH (benign prostatic hypertrophy)   . H/O: hypothyroidism   . History of chronic pancreatitis   . Alcohol abuse, in remission     sober x years  . Paroxysmal a-fib (HCC)     history of  . Hx: UTI (urinary tract infection)   . Diabetes mellitus type II     history of  . ARF (acute renal failure) (HCC)     history of  . History of chicken pox   . Pulmonary nodule/lesion, solitary 05/2010    67mm L lung base nodule, if high risk rec f/u with CT 1 yr, if low risk, no f/u needed  . Muscle weakness (generalized)   . Lack of coordination   . Psychosis   . Episodic mood disorder ([place])   . Extrapyramidal disorder   . Altered mental status   . ALCOHOL ABUSE, HX OF 12/24/2006    Qualifier: Diagnosis of  By: [name]  MD, [name]    . COLON POLYP 12/24/2006    Qualifier: Diagnosis of  By: [name]    . Oral lesion 02/27/2011  . Subdural hematoma, post-traumatic ([place]) 08/04/2013  . Protein-calorie malnutrition, severe ([place]) 08/05/2013   Past Surgical History  Procedure Laterality Date  .  Coronary artery bypass graft  2006  . Injection knee  09/06/2001  . Cardiovascular stress test  05/2008    small regions of perfusion abnormality suggestive of diaphragm artifact, exercise cap 7 METs  . Hip arthroplasty Left 07/30/2014    Procedure: ARTHROPLASTY BIPOLAR HIP;  Surgeon: Johnn Hai, MD;  Location: [facility];  Service: Orthopedics;  Laterality: Left;    Allergies  Allergen Reactions  . Codeine     REACTION: Rash,itching      Medication List       This list is accurate as of: 12/20/15  7:18 PM.  Always use your most recent med list.               acetaminophen 500 MG tablet  Commonly known as:  TYLENOL  Take 1,000 mg by  mouth every 8 (eight) hours as needed (toe pain).     CALCIUM 600 PO  2 by mouth daily for osteopenia     CERTAGEN PO  Take 1 tablet by mouth daily.     cholecalciferol 1000 units tablet  Commonly known as:  VITAMIN D  Take 1,000 Units by mouth daily.     mirtazapine 7.5 MG tablet  Commonly known as:  REMERON  Take 7.5 mg by mouth at bedtime.     sertraline 50 MG tablet  Commonly known as:  ZOLOFT  Take 25 mg by mouth every morning.        Review of Systems  Unable to perform ROS: Dementia    Immunization History  Administered Date(s) Administered  . Influenza Whole 09/05/2013  . Influenza,inj,Quad PF,36+ Mos 08/08/2013, 07/31/2014  . Influenza-Unspecified 08/13/2015  . PPD Test 08/04/2013, 07/27/2014, 08/22/2014  . Pneumococcal Conjugate-13 01/03/2013, 08/28/2014   Pertinent  Health Maintenance Due  Topic Date Due  . PNA vac Low Risk Adult (2 of 2 - PPSV23) 10/27/2016 (Originally 08/29/2015)  . INFLUENZA VACCINE  05/27/2016   Fall Risk  02/02/2015  Falls in the past year? Exclusion - non ambulatory  Risk for fall due to : Impaired mobility;Impaired vision;Impaired balance/gait;Medication side effect   Functional Status Survey:    Filed Vitals:   12/20/15 1911  BP: 119/63  Pulse: 79  Temp: 98.8 F (37.1 C)  Resp: 18  Weight: 126 lb 6.4 oz (57.335 kg)  SpO2: 98%   Body mass index is 16.68 kg/(m^2). Physical Exam  Constitutional:  Frail Elderly in no acute distress.   HENT:  Head: Normocephalic.  Eyes: Conjunctivae and EOM are normal. Pupils are equal, round, and reactive to light. Right eye exhibits no discharge. Left eye exhibits no discharge. No scleral icterus.  Neck: Normal range of motion. No JVD present. No tracheal deviation present.  Cardiovascular: Normal rate, regular rhythm, normal heart sounds and intact distal pulses.  Exam reveals no gallop and no friction rub.   No murmur heard. Pulmonary/Chest: Effort normal and breath sounds normal. No  respiratory distress. He has no wheezes. He has no rales. He exhibits no tenderness.  Abdominal: Soft. Bowel sounds are normal. He exhibits no distension and no mass. There is no tenderness. There is no rebound and no guarding.  Genitourinary:  Incontinent wears diaper.  Musculoskeletal: He exhibits no edema or tenderness.  Resist ROM   Neurological: He is alert.  Skin: Skin is warm and dry. No rash noted. No erythema. No pallor.  Psychiatric: He has a normal mood and affect.    Labs reviewed:  Recent Labs  02/05/15 09/18/15  NA 143 144  K 4.3 4.0  BUN 18 22*  CREATININE 0.9 1.0    Recent Labs  09/18/15  AST 16  ALT 17  ALKPHOS 97    Recent Labs  02/20/15 09/18/15  WBC 6.4 6.9  HGB 15.0 15.5  HCT 44 48  PLT 175 165   Lab Results  Component Value Date   TSH 2.04 12/06/2014   Lab Results  Component Value Date   HGBA1C 5.8 09/06/2014   Lab Results  Component Value Date   CHOL 131 11/15/2013   HDL 45 11/15/2013   LDLCALC 67 11/15/2013   LDLDIRECT 75.4 01/27/2011   TRIG 97 11/15/2013   CHOLHDL 2.6 11/13/2009    Significant Diagnostic Results in last 30 days:  No results found.  Assessment/Plan 1. Essential hypertension, benign B/p within normal range. Currently no meds. Continue to monitor.   2. Dementia with behavioral disturbance Advanced. No new behavioral issues. Continue to assist with ADL's. Aspiration precautions. Fall and safety precaution.Pressure ulcer preventive measures.   3. Depression due to dementia Mood stable on current medication. Continue to monitor for mood changes.   4. Senile osteopenia Continue on Calcium and Vit D.     Family/ staff Communication: Reviewed plan of care with facility Nurse.   Labs/tests ordered:  None.

## 2016-01-26 DEATH — deceased

## 2016-07-06 IMAGING — CR DG HIP (WITH OR WITHOUT PELVIS) 2-3V*L*
3 series · 3 of 3 positions shown · non-contrast
Comparison: 07/29/2014

CLINICAL DATA: Postoperative left hip pain after left hip
arthroplasty.

EXAM:
LEFT HIP - COMPLETE 2+ VIEW

[AP (1 of 2)]
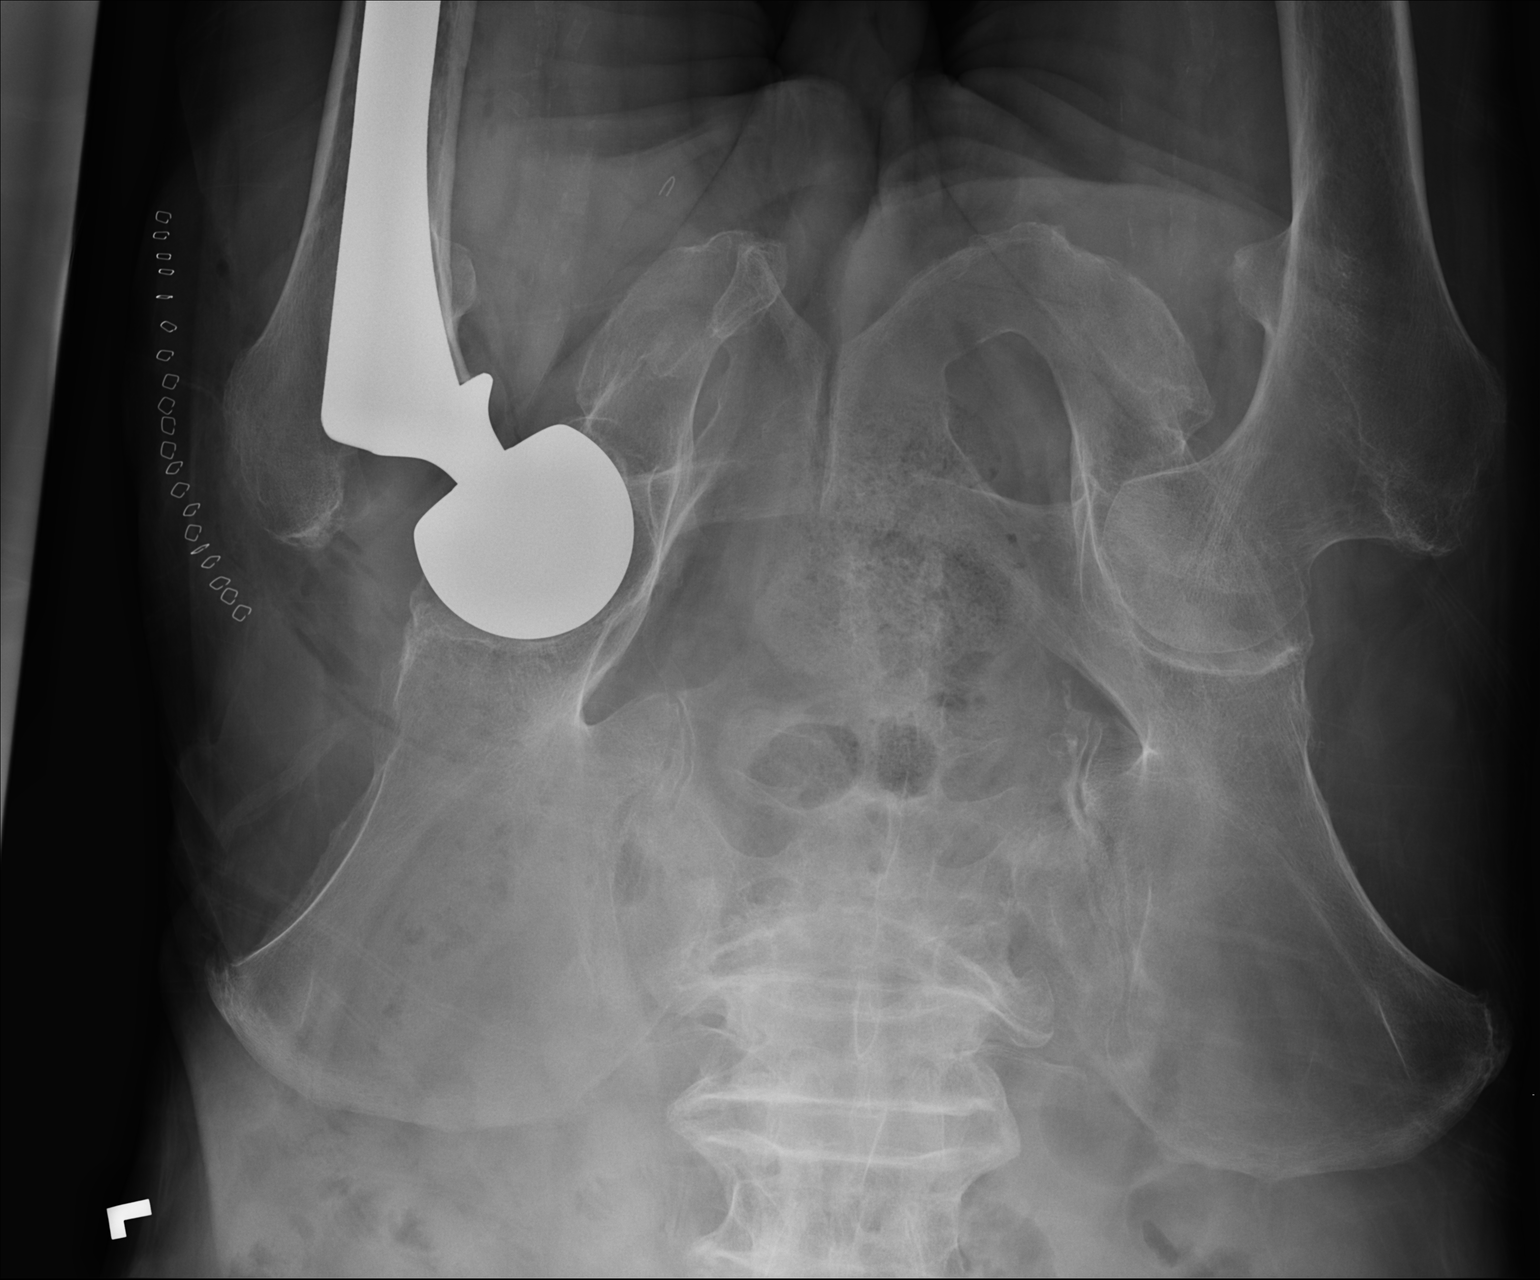

[AP (2 of 2)]
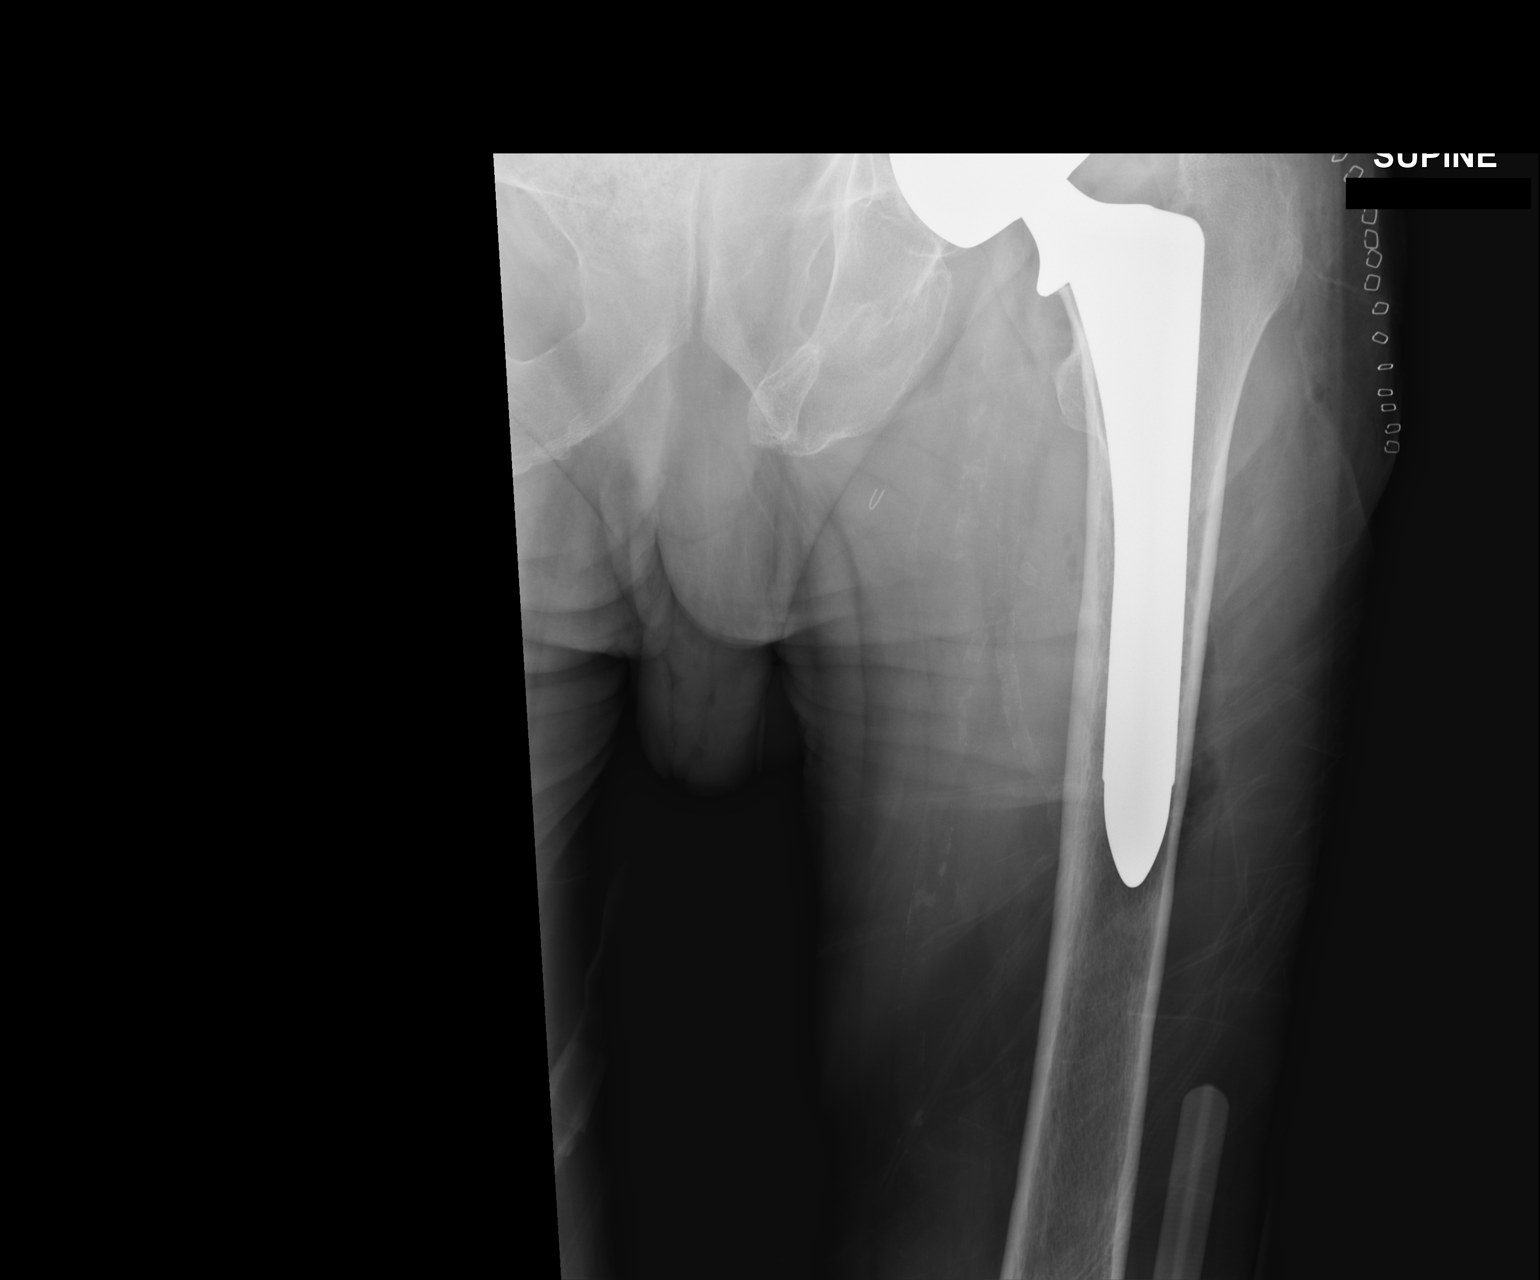

[xtable lateral]
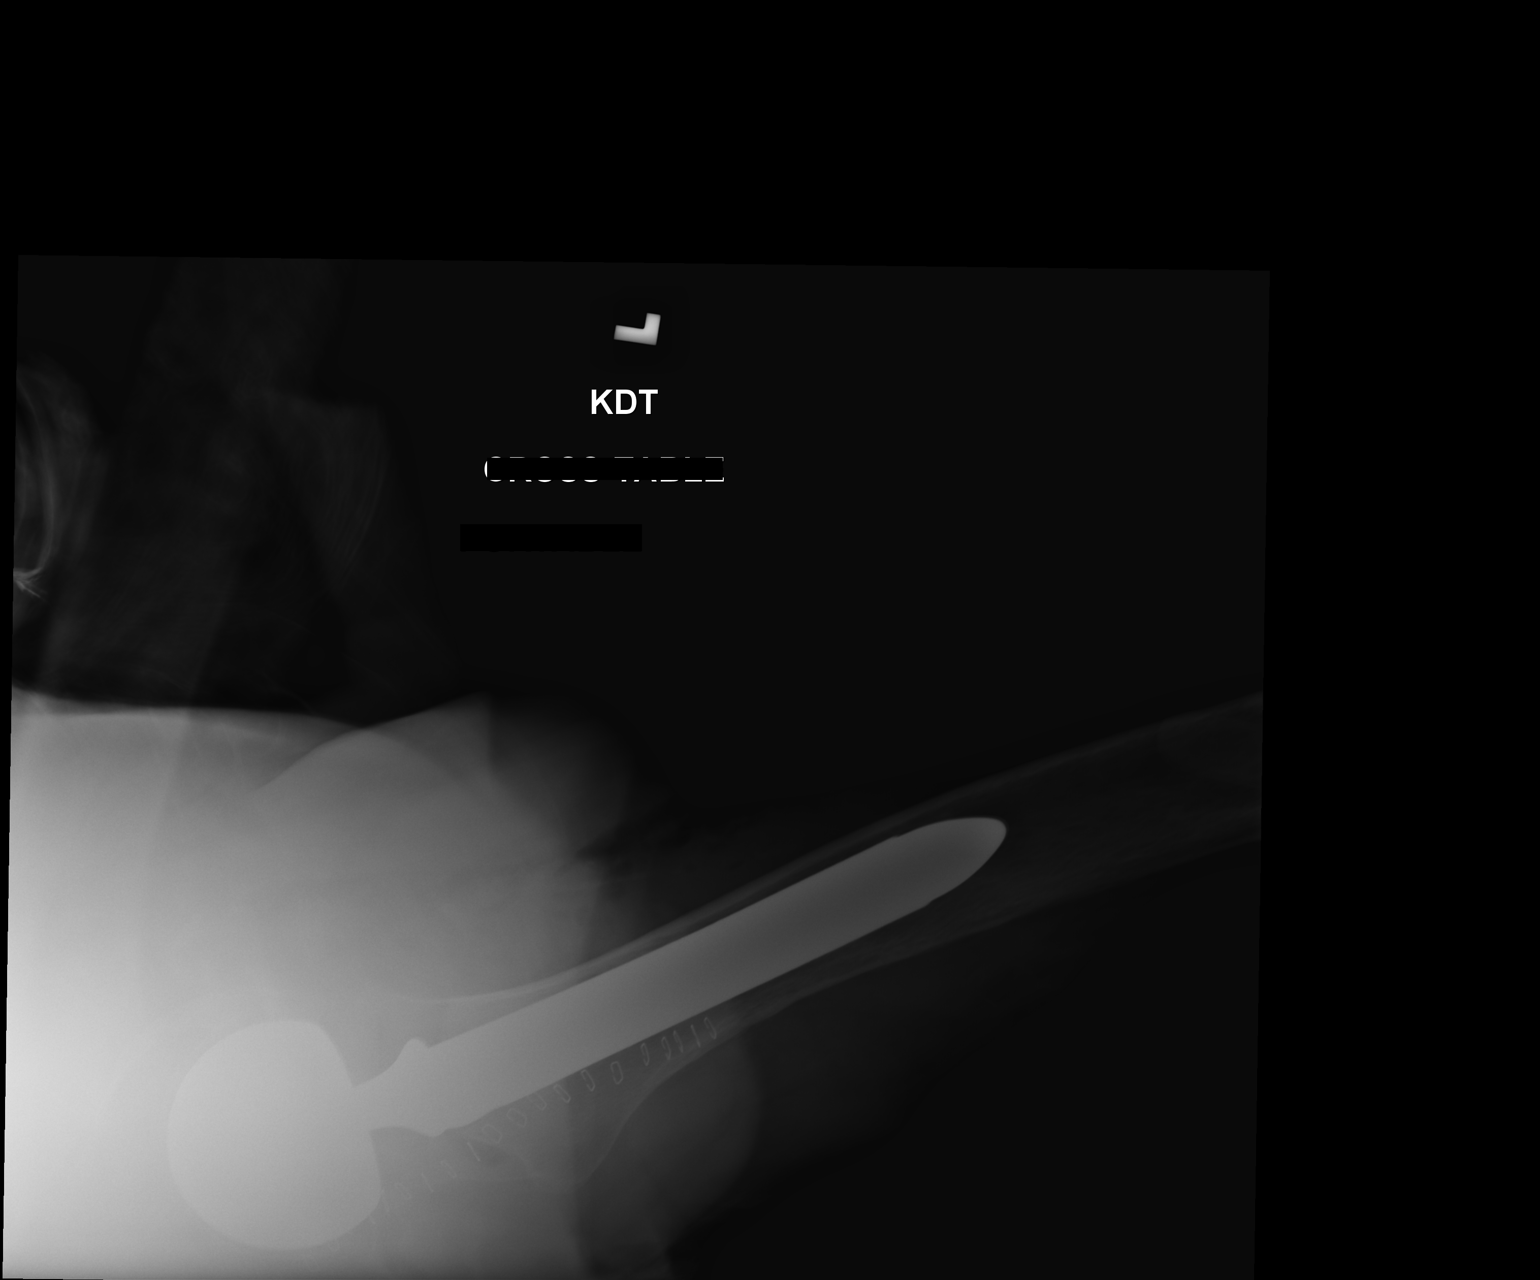

[3 of 3 positions shown; findings below may reference images not displayed]

FINDINGS: Skin staples are present over the lateral soft tissues. There is
evidence of patient's left hip arthroplasty normally located and
intact. Remainder of the exam is unchanged.
IMPRESSION: Postop change compatible recent left hip arthroplasty. No acute
findings.
